# Patient Record
Sex: Female | Born: 1935
Health system: Southern US, Community
[De-identification: ages and names within clinical notes are randomized; demographics above are authoritative.]

## PROBLEM LIST (undated history)

## (undated) DIAGNOSIS — M1990 Unspecified osteoarthritis, unspecified site: Secondary | ICD-10-CM

## (undated) DIAGNOSIS — I471 Supraventricular tachycardia: Secondary | ICD-10-CM

## (undated) DIAGNOSIS — K219 Gastro-esophageal reflux disease without esophagitis: Secondary | ICD-10-CM

## (undated) DIAGNOSIS — E785 Hyperlipidemia, unspecified: Secondary | ICD-10-CM

## (undated) DIAGNOSIS — H3530 Unspecified macular degeneration: Secondary | ICD-10-CM

## (undated) DIAGNOSIS — N952 Postmenopausal atrophic vaginitis: Secondary | ICD-10-CM

## (undated) DIAGNOSIS — L309 Dermatitis, unspecified: Principal | ICD-10-CM

## (undated) DIAGNOSIS — S82899A Other fracture of unspecified lower leg, initial encounter for closed fracture: Secondary | ICD-10-CM

## (undated) DIAGNOSIS — R911 Solitary pulmonary nodule: Secondary | ICD-10-CM

## (undated) DIAGNOSIS — I4891 Unspecified atrial fibrillation: Secondary | ICD-10-CM

## (undated) DIAGNOSIS — F419 Anxiety disorder, unspecified: Secondary | ICD-10-CM

## (undated) HISTORY — DX: Hyperlipidemia, unspecified: E78.5

## (undated) HISTORY — DX: Other fracture of unspecified lower leg, initial encounter for closed fracture: S82.899A

## (undated) HISTORY — DX: Supraventricular tachycardia: I47.1

## (undated) HISTORY — DX: Dermatitis, unspecified: L30.9

## (undated) HISTORY — PX: ABDOMINAL HYSTERECTOMY: SHX81

## (undated) HISTORY — PX: HEMORRHOID SURGERY: SHX153

## (undated) HISTORY — PX: TRIGGER FINGER RELEASE: SHX641

## (undated) HISTORY — PX: CERVICAL DISC SURGERY: SHX588

## (undated) HISTORY — DX: Postmenopausal atrophic vaginitis: N95.2

## (undated) HISTORY — DX: Unspecified macular degeneration: H35.30

## (undated) HISTORY — PX: OTHER SURGICAL HISTORY: SHX169

## (undated) HISTORY — DX: Unspecified atrial fibrillation: I48.91

## (undated) HISTORY — DX: Gastro-esophageal reflux disease without esophagitis: K21.9

---

## 2001-01-23 ENCOUNTER — Ambulatory Visit (HOSPITAL_COMMUNITY): Admission: RE | Admit: 2001-01-23 | Discharge: 2001-01-23 | Payer: Self-pay | Admitting: Orthopaedic Surgery

## 2001-01-23 ENCOUNTER — Encounter: Payer: Self-pay | Admitting: Orthopaedic Surgery

## 2001-02-10 ENCOUNTER — Encounter: Payer: Self-pay | Admitting: Neurosurgery

## 2001-02-13 ENCOUNTER — Encounter: Payer: Self-pay | Admitting: Neurosurgery

## 2001-02-13 ENCOUNTER — Inpatient Hospital Stay (HOSPITAL_COMMUNITY): Admission: RE | Admit: 2001-02-13 | Discharge: 2001-02-14 | Payer: Self-pay | Admitting: Neurosurgery

## 2001-03-08 ENCOUNTER — Encounter: Admission: RE | Admit: 2001-03-08 | Discharge: 2001-03-08 | Payer: Self-pay | Admitting: Neurosurgery

## 2001-03-08 ENCOUNTER — Encounter: Payer: Self-pay | Admitting: Neurosurgery

## 2001-04-27 ENCOUNTER — Encounter: Payer: Self-pay | Admitting: Neurosurgery

## 2001-04-27 ENCOUNTER — Encounter: Admission: RE | Admit: 2001-04-27 | Discharge: 2001-04-27 | Payer: Self-pay | Admitting: Neurosurgery

## 2001-08-28 ENCOUNTER — Encounter: Payer: Self-pay | Admitting: Internal Medicine

## 2001-08-28 ENCOUNTER — Ambulatory Visit (HOSPITAL_COMMUNITY): Admission: RE | Admit: 2001-08-28 | Discharge: 2001-08-28 | Payer: Self-pay | Admitting: Internal Medicine

## 2001-09-22 ENCOUNTER — Ambulatory Visit (HOSPITAL_COMMUNITY): Admission: RE | Admit: 2001-09-22 | Discharge: 2001-09-22 | Payer: Self-pay | Admitting: Internal Medicine

## 2002-09-03 ENCOUNTER — Ambulatory Visit (HOSPITAL_COMMUNITY): Admission: RE | Admit: 2002-09-03 | Discharge: 2002-09-03 | Payer: Self-pay | Admitting: Internal Medicine

## 2002-09-03 ENCOUNTER — Encounter: Payer: Self-pay | Admitting: Internal Medicine

## 2003-03-21 ENCOUNTER — Encounter: Admission: RE | Admit: 2003-03-21 | Discharge: 2003-03-21 | Payer: Self-pay | Admitting: Orthopedic Surgery

## 2003-03-21 ENCOUNTER — Encounter: Payer: Self-pay | Admitting: Orthopedic Surgery

## 2003-03-22 ENCOUNTER — Ambulatory Visit (HOSPITAL_COMMUNITY): Admission: RE | Admit: 2003-03-22 | Discharge: 2003-03-22 | Payer: Self-pay | Admitting: Orthopedic Surgery

## 2003-03-22 ENCOUNTER — Ambulatory Visit (HOSPITAL_BASED_OUTPATIENT_CLINIC_OR_DEPARTMENT_OTHER): Admission: RE | Admit: 2003-03-22 | Discharge: 2003-03-22 | Payer: Self-pay | Admitting: Orthopedic Surgery

## 2003-09-09 ENCOUNTER — Ambulatory Visit (HOSPITAL_COMMUNITY): Admission: RE | Admit: 2003-09-09 | Discharge: 2003-09-09 | Payer: Self-pay | Admitting: Internal Medicine

## 2004-06-25 ENCOUNTER — Ambulatory Visit (HOSPITAL_COMMUNITY): Admission: RE | Admit: 2004-06-25 | Discharge: 2004-06-25 | Payer: Self-pay | Admitting: Internal Medicine

## 2004-11-12 ENCOUNTER — Ambulatory Visit: Payer: Self-pay | Admitting: Orthopedic Surgery

## 2005-02-01 ENCOUNTER — Ambulatory Visit (HOSPITAL_COMMUNITY): Admission: RE | Admit: 2005-02-01 | Discharge: 2005-02-01 | Payer: Self-pay | Admitting: Internal Medicine

## 2005-03-22 ENCOUNTER — Ambulatory Visit: Payer: Self-pay | Admitting: Orthopedic Surgery

## 2005-04-19 ENCOUNTER — Ambulatory Visit: Payer: Self-pay | Admitting: Orthopedic Surgery

## 2005-04-22 ENCOUNTER — Encounter: Payer: Self-pay | Admitting: Orthopedic Surgery

## 2005-04-22 ENCOUNTER — Ambulatory Visit (HOSPITAL_COMMUNITY): Admission: RE | Admit: 2005-04-22 | Discharge: 2005-04-22 | Payer: Self-pay | Admitting: Orthopedic Surgery

## 2006-03-04 ENCOUNTER — Ambulatory Visit (HOSPITAL_COMMUNITY): Admission: RE | Admit: 2006-03-04 | Discharge: 2006-03-04 | Payer: Self-pay | Admitting: Internal Medicine

## 2006-09-01 ENCOUNTER — Ambulatory Visit (HOSPITAL_COMMUNITY): Admission: RE | Admit: 2006-09-01 | Discharge: 2006-09-01 | Payer: Self-pay | Admitting: Internal Medicine

## 2007-08-29 ENCOUNTER — Ambulatory Visit (HOSPITAL_COMMUNITY): Admission: RE | Admit: 2007-08-29 | Discharge: 2007-08-29 | Payer: Self-pay | Admitting: Internal Medicine

## 2007-09-14 ENCOUNTER — Ambulatory Visit: Payer: Self-pay | Admitting: Orthopedic Surgery

## 2007-09-14 DIAGNOSIS — M25519 Pain in unspecified shoulder: Secondary | ICD-10-CM

## 2007-09-14 DIAGNOSIS — M7580 Other shoulder lesions, unspecified shoulder: Secondary | ICD-10-CM

## 2007-10-23 ENCOUNTER — Encounter: Payer: Self-pay | Admitting: Orthopedic Surgery

## 2007-10-23 ENCOUNTER — Telehealth: Payer: Self-pay | Admitting: Orthopedic Surgery

## 2007-10-23 ENCOUNTER — Ambulatory Visit (HOSPITAL_COMMUNITY): Admission: RE | Admit: 2007-10-23 | Discharge: 2007-10-23 | Payer: Self-pay | Admitting: Internal Medicine

## 2007-10-30 ENCOUNTER — Ambulatory Visit: Payer: Self-pay | Admitting: Orthopedic Surgery

## 2007-10-30 DIAGNOSIS — M5380 Other specified dorsopathies, site unspecified: Secondary | ICD-10-CM | POA: Insufficient documentation

## 2007-10-30 DIAGNOSIS — M545 Low back pain: Secondary | ICD-10-CM

## 2008-06-21 HISTORY — PX: KNEE ARTHROSCOPY: SUR90

## 2008-07-30 ENCOUNTER — Ambulatory Visit (HOSPITAL_COMMUNITY): Admission: RE | Admit: 2008-07-30 | Discharge: 2008-07-30 | Payer: Self-pay | Admitting: Internal Medicine

## 2008-10-22 ENCOUNTER — Encounter: Payer: Self-pay | Admitting: Orthopedic Surgery

## 2008-11-20 ENCOUNTER — Ambulatory Visit: Payer: Self-pay | Admitting: Orthopedic Surgery

## 2008-11-20 DIAGNOSIS — M1710 Unilateral primary osteoarthritis, unspecified knee: Secondary | ICD-10-CM | POA: Insufficient documentation

## 2008-11-20 DIAGNOSIS — M25569 Pain in unspecified knee: Secondary | ICD-10-CM | POA: Insufficient documentation

## 2009-04-16 ENCOUNTER — Ambulatory Visit: Payer: Self-pay | Admitting: Orthopedic Surgery

## 2009-04-16 DIAGNOSIS — M23302 Other meniscus derangements, unspecified lateral meniscus, unspecified knee: Secondary | ICD-10-CM

## 2009-04-29 ENCOUNTER — Telehealth: Payer: Self-pay | Admitting: Orthopedic Surgery

## 2009-05-02 ENCOUNTER — Ambulatory Visit: Payer: Self-pay | Admitting: Orthopedic Surgery

## 2009-05-02 ENCOUNTER — Ambulatory Visit (HOSPITAL_COMMUNITY): Admission: RE | Admit: 2009-05-02 | Discharge: 2009-05-02 | Payer: Self-pay | Admitting: Orthopedic Surgery

## 2009-05-06 ENCOUNTER — Ambulatory Visit: Payer: Self-pay | Admitting: Orthopedic Surgery

## 2009-05-19 ENCOUNTER — Ambulatory Visit: Payer: Self-pay | Admitting: Orthopedic Surgery

## 2010-01-12 ENCOUNTER — Ambulatory Visit (HOSPITAL_COMMUNITY): Admission: RE | Admit: 2010-01-12 | Discharge: 2010-01-12 | Payer: Self-pay | Admitting: Internal Medicine

## 2010-07-23 NOTE — Letter (Signed)
Summary: surgery order LT knee sched 05/02/09  surgery order LT knee sched 05/02/09   Imported By: Cammie Sickle 08/05/2009 16:16:17  _____________________________________________________________________  External Attachment:    Type:   Image     Comment:   External Document

## 2010-09-23 LAB — DIFFERENTIAL
Basophils Relative: 0 % (ref 0–1)
Lymphs Abs: 1.5 10*3/uL (ref 0.7–4.0)
Monocytes Relative: 10 % (ref 3–12)
Neutro Abs: 3.3 10*3/uL (ref 1.7–7.7)
Neutrophils Relative %: 61 % (ref 43–77)

## 2010-09-23 LAB — BASIC METABOLIC PANEL
BUN: 11 mg/dL (ref 6–23)
Calcium: 10 mg/dL (ref 8.4–10.5)
Creatinine, Ser: 0.72 mg/dL (ref 0.4–1.2)
GFR calc Af Amer: >60 mL/min (ref >60)

## 2010-09-23 LAB — CBC
MCHC: 34.3 g/dL (ref 30.0–36.0)
Platelets: 221 10*3/uL (ref 150–400)
RBC: 4.24 MIL/uL (ref 3.87–5.11)
WBC: 5.3 10*3/uL (ref 4.0–10.5)

## 2010-11-06 NOTE — Op Note (Signed)
[place]  Patient:    Marcia Woods, Marcia Woods Visit Number: [id number] MRN: [id number]          Service Type: SUR Location: 5700 5729 02 Attending Physician:  Coletta Memos Proc. Date: 02/13/01 Adm. Date:  02/13/2001 Disc. Date: 02/14/2001                             Operative Report  PREOPERATIVE DIAGNOSIS: 1. Displaced disk, right C6-7. 2. Right C7 radiculopathy. 3. Spondylosis.  POSTOPERATIVE DIAGNOSIS: 1. Displaced disk, right C6-7. 2. Right C7 radiculopathy. 3. Spondylosis.  OPERATION PERFORMED:  Anterior cervical diskectomy C6-7, arthrodesis C6-7 with 7 mm allograft and anterior plating using 16 mm Synthes plate.  COMPLICATIONS:  None.  SURGEON:  Kyle L. Franky Macho, M.D.  ASSISTANT:  Stefani Dama, M.D.  ANESTHESIA:  General endotracheal.  INDICATIONS FOR PROCEDURE:  The patient is a 77 year old with a two-month history of severe pain in her right upper extremity along with numbness.  She has had medication which was not helpful.  She did not want any further conservative treatment.  DESCRIPTION OF PROCEDURE:  The patient was brought to the operating room, intubated and placed under general anesthesia without difficulty.  Her neck was placed in slight extension with five pounds of cervical traction.  She was prepped and draped in sterile fashion.  I infiltrated 2 cc of 0.5% lidocaine 1:200,000 strength epinephrine in the most caudal skin creases she had.  I made the incision starting in the midline, taken to the border of the medial border of the left sternocleidomastoid muscle.  Using Metzenbaum scissors I was then able to divide the platysma horizontally.  I also used the Metzenbaum scissors to create a plane between the sternocleidomastoid and strap muscles to the prevertebral space.  I used a hand held retractor and removed prevertebral tissue.  I placed the needle and this was at C6-7.  I then reflect the longus colli  muscles laterally and replaced self-retaining Caspar retractors.  I opened the disk space with a #15 blade and used curet and pituitary rongeurs along with Kerrison punches to perform the diskectomy. When that was done, I placed an intervertebral spreader into the disk space and then used a high speed drill to remove osteophytes and some soft tissue. I then used a Kerrison punch with thin foot plate to get underneath the ligament and open that.  There was significant bleeding encountered in the posterior longitudinal ligament. Dr. Danielle Dess assisted during this portion of the case.  We were able to decompress both C7 nerve roots quite well.  I inspected the nerve roots with a micronerve hook and felt that there was no compression and also felt no residual disk material.  I was satisfied with the decompression.  I then irrigated the wound and placed in Gelfoam.  I then placed a 7 mm allograft after using a QD11 bur to shape the end plates.  After removing the weight, I then placed a 16 mm plate, two screws in C6, two screws in C7 with locking screws.  X-ray showed the plate and the plug to be in good position.  I irrigated the wound and then closed the wound in layered fashion using Vicryl sutures reapproximating the platysma and the subcutaneous tissues.  Dermabond used for sterile dressing. Attending Physician:  Coletta Memos DD:  02/13/01 TD:  02/14/01 Job: [id number]

## 2010-11-06 NOTE — H&P (Signed)
[place]  Patient:    Marcia, Woods Visit Number: [id number] MRN: [id number]          Service Type: SUR Location: 5700 5729 02 Attending Physician:  Coletta Memos Dictated by:   Mena Goes. Franky Macho, M.D. Adm. Date:  02/13/2001                           History and Physical  ADMITTING DIAGNOSIS: Displaced disk, C6-7, right, with spondylosis and right C7 radiculopathy.  HISTORY OF PRESENT ILLNESS: Marcia Woods is a 77 year old woman, who presented February 08, 2001 with pain and tenderness in the right upper extremity.  She complained of the arm going numb and it has not improved.  She has been hurting for the last two months.  She has tried both Bextra and Celebrex without relief.  She has no temporally related traumatic events.  The pain came on rather suddenly and has gradually worsened since its onset.  She has not had pain like this in the past.  She has noticed that she is not able to hold objects in her right hand as well as she did in the past.  She has had no bowel or bladder dysfunction.  She complains of pain in the right arm.  She also has some numbness in the right lower extremity, but none in the right torso.  PAST MEDICAL HISTORY: Excellent.  PAST SURGICAL HISTORY:  1. Partial hysterectomy.  2. Neurectomies in the feet.  3. Hemorrhoid surgery.  4. Laser surgery for macular degeneration, bilateral.  ALLERGIES: No known drug allergies.  MEDICATIONS:  1. Actonel 30 mg once a week.  2. Ocuvite b.i.d. for vision.  FAMILY HISTORY: Mother and father both deceased.  SOCIAL HISTORY: She does not smoke, drink, nor does she use illicit drugs.  REVIEW OF SYSTEMS: Positive for irregular pulse, leg pain with walking, leg weakness, arm pain, and leg pain.  She denies constitutional, eye, ear, nose, throat, mouth, respiratory, gastrointestinal, genitourinary, skin, neurologic, psychiatric, endocrine, hematologic, and allergic  problems.  PHYSICAL EXAMINATION:  VITAL SIGNS: Height 5 feet 4-1/2 inches.  Weight 130 pounds.  GENERAL: Marcia Woods is alert and oriented x 4, and answers all questions appropriately.  Memory, language, attention span, and fund of knowledge are normal.  She is well-kempt and in no distress.  NEUROLOGIC: PERRL.  EOMI.  Funduscopic examination normal.  Normal visual fields.  No papilledema.  Strength 5/5 in left upper and both lower extremities.  Mild weakness, right triceps.  Intact proprioception and pinprick in the upper and lower extremities.  Normal muscle tone, bulk, and coordination.  Romberg test negative.  Gait normal.  Reflexes 2+ at the biceps, triceps, brachial radialis, knee, and ankles.  No clonus appreciated. Toes downgoing to plantar stimulation.  NECK: No cervical masses or bruits.  CHEST: Lung fields are clear.  HEART: Regular rhythm and rate on examination today.  Pulse 88.  No murmurs or rubs appreciated.  EXTREMITIES: Pulses good at the wrists and feet bilaterally.  LABORATORY DATA: MRI shows an exaggerated lordosis and foraminal narrowing and stenosis at C6-7 on the right side, normal cord signal throughout.  IMPRESSION: Marcia Woods feels she is in a significant amount of pain.  She did not want to try further conservative treatment, which would have included therapy and traction.  For that reason she is scheduled today to undergo anterior cervical diskectomy and arthrodesis.  Risks of the procedure including bleeding,  infection, no pain relief, bowel or bladder dysfunction and need for further surgery, damage to the recurrent laryngeal nerve were explained.  She understands and wishes to proceed. Dictated by:   Mena Goes. Franky Macho, M.D. Attending Physician:  Coletta Memos DD:  02/13/01 TD:  02/14/01 Job: [id number]

## 2010-11-06 NOTE — Op Note (Signed)
[name]  Patient:    Marcia Woods, Marcia Woods Visit Number: [id number] MRN: [id number]          Service Type: END Location: DAY Attending Physician:  Malissa Hippo Dictated by:   Lionel December, M.D. Proc. Date: 09/22/01 Admit Date:  09/22/2001                             Operative Report  PROCEDURE:  Total colonoscopy.  ENDOSCOPIST:  Lionel December, M.D.  INDICATION:  Ms. Gadea is a 77 year old Caucasian female who is here for screening colonoscopy.  Family history is negative for colorectal carcinoma and she is therefore felt to be of average risk.  Procedure and risks were reviewed with the patient and informed consent was obtained.  PREOPERATIVE MEDICATIONS:  Demerol 50 mg IV, Versed 3 mg IV in divided dose.  INSTRUMENT:  Olympus video system.  FINDINGS:  Procedure was performed in endoscopy suite.  Patients vital signs and O2 saturations were monitored during the procedure and remained stable. Patient was placed in the left lateral decubitus position and rectal examination performed.  She had soft sentinel skin tags.  Digital exam was normal.  Scope was placed in the rectum and placed under direct vision to the sigmoid colon and beyond.  Preparation was satisfactory.  Scope was passed to the cecum, which was identified to the ileocecal valve and appendiceal orifice.  Pictures could not be taken because of malfunction of system.  As the scope was withdrawn, mucosa was very carefully examined and there were no polyps, tumor masses or diverticula.  Rectal mucosa was normal.  Scope was retroflexed to examine anorectal junction and hemorrhoids were noted below the dentate line.  Endoscope was straightened and withdrawn.  Patient tolerated the procedure well.  FINAL DIAGNOSIS:  Small external hemorrhoids, otherwise, normal total colonoscopy.  RECOMMENDATIONS:  She can resume her ASA.  She should continue yearly Hemoccults.  Dictated by:   Lionel December, M.D. Attending Physician:  Malissa Hippo DD:  09/22/01 TD:  09/23/01 Job: [id number]

## 2010-11-06 NOTE — Op Note (Signed)
[name], [name]                         ACCOUNT NO.:  [id number]   MEDICAL RECORD NO.:  [id number]                   PATIENT TYPE:  OUT   LOCATION:  [facility]                                 FACILITY:  [facility]   PHYSICIAN:  Harvie Junior, M.D.                DATE OF BIRTH:  23-Jan-1926   DATE OF PROCEDURE:  03/22/2003  DATE OF DISCHARGE:  03/22/2003                                 OPERATIVE REPORT   PREOPERATIVE DIAGNOSIS:  Trigger thumb right.   POSTOPERATIVE DIAGNOSIS:  Trigger thumb right.   PROCEDURE:  Release right trigger  thumb.   SURGEON:  Harvie Junior, M.D.   ASSISTANT:  Marshia Ly, P.A.   ANESTHESIA:  Forearm based IV regional.   INDICATIONS FOR PROCEDURE:  The patient is a 77 year old female with a long  history of having locking in the right thumb. This ultimately had to be  pulled out with the other  hand when it was locked. Because of continued  complaints of pain and failure of conservative care, the patient was taken  to the operating room for right trigger thumb release.   DESCRIPTION OF PROCEDURE:  After adequate anesthesia was obtained with a  forearm based IV regional, the patient was placed on the operating table.  The right arm was prepped and draped in the usual sterile fashion.   Following this a linear incision was made in the thumb crease. The  subcutaneous tissue was taken down to the level of the A1 pulley. It was  clearly identified. The digital nerves were retracted and the A1 pulley was  released. The flexor tendon to the thumb could then be pulled out of the  wound easily. There was no tendency towards triggering or locking at this  point.   The wound was copiously irrigated and suctioned dry. It was closed with 2  interrupted horizontal mattress stitches. A sterile compressive dressing was  applied. The patient was taken to the recovery room in satisfactory  condition. Estimated blood loss for the procedure was none.                                  Harvie Junior, M.D.    Ranae Plumber  D:  06/14/2003  T:  06/16/2003  Job:  [id number]

## 2011-01-25 ENCOUNTER — Other Ambulatory Visit (HOSPITAL_COMMUNITY): Payer: Self-pay | Admitting: Internal Medicine

## 2011-01-25 DIAGNOSIS — Z139 Encounter for screening, unspecified: Secondary | ICD-10-CM

## 2011-02-02 ENCOUNTER — Ambulatory Visit (HOSPITAL_COMMUNITY)
Admission: RE | Admit: 2011-02-02 | Discharge: 2011-02-02 | Disposition: A | Discharge status: Home / Self Care | Payer: Medicare Other | Source: Ambulatory Visit | Attending: Internal Medicine | Admitting: Internal Medicine

## 2011-02-02 DIAGNOSIS — Z139 Encounter for screening, unspecified: Secondary | ICD-10-CM

## 2011-02-02 DIAGNOSIS — Z1231 Encounter for screening mammogram for malignant neoplasm of breast: Secondary | ICD-10-CM | POA: Insufficient documentation

## 2011-03-19 ENCOUNTER — Encounter (INDEPENDENT_AMBULATORY_CARE_PROVIDER_SITE_OTHER): Payer: Self-pay | Admitting: *Deleted

## 2011-03-25 ENCOUNTER — Ambulatory Visit (INDEPENDENT_AMBULATORY_CARE_PROVIDER_SITE_OTHER): Payer: Medicare Other | Admitting: Orthopedic Surgery

## 2011-03-25 ENCOUNTER — Encounter: Payer: Self-pay | Admitting: Orthopedic Surgery

## 2011-03-25 VITALS — BP 150/80 | Ht 64.5 in | Wt 135.6 lb

## 2011-03-25 DIAGNOSIS — M1710 Unilateral primary osteoarthritis, unspecified knee: Secondary | ICD-10-CM

## 2011-03-25 MED ORDER — DICLOFENAC POTASSIUM 50 MG PO TABS: 50.0000 mg | ORAL_TABLET | Freq: Two times a day (BID) | ORAL | Status: DC

## 2011-03-25 MED ORDER — METHYLPREDNISOLONE ACETATE 40 MG/ML IJ SUSP: 40.0000 mg | Freq: Once | INTRAMUSCULAR | Status: DC

## 2011-03-25 NOTE — Progress Notes (Signed)
Chief complaint RIGHT knee pain  I have 77 year old female stepped up on a curb or off a curb and twisted her RIGHT knee complains of anterior knee pain she was due for followup visit anyway.  Her pain is 3/10 it is aching its deep in the RIGHT knee joint.  No catching or locking no swelling.  She has some pain behind her knee.  Her exam is significant for the following she has anterior crepitance and knee pain with patella compression joint lines are nontender meniscal signs are negative strength is normal knee is stable skin is intact pulses good sensation is normal  X-rays show patellofemoral arthritis  Recommend cortisone injection discussed possible knee replacement patient declined  She will followup either for another injection in 3-4 months or for knee surgery.  Separate x-ray report 3 views RIGHT knee  X-rays taken to cause of injury and knee pain  Patellofemoral arthritis is noted with complete joint space loss on the medial side.  She has a fairly good joint space medial and lateral tibiofemoral joint  Impression patellofemoral arthritis severe, mild tibiofemoral arthritis  Inject RIGHT knee  Knee  Injection Procedure Note  Pre-operative Diagnosis: right knee oa  Post-operative Diagnosis: same  Indications: pain  Anesthesia: ethyl chloride   Procedure Details   Verbal consent was obtained for the procedure. Time out was completed.The joint was prepped with alcohol, followed by  Ethyl chloride spray and A 20 gauge needle was inserted into the knee via lateral approach; 4ml 1% lidocaine and 1 ml of depomedrol  was then injected into the joint . The needle was removed and the area cleansed and dressed.  Complications:  None; patient tolerated the procedure well.

## 2011-03-25 NOTE — Patient Instructions (Signed)
You have received a steroid shot. 15% of patients experience increased pain at the injection site with in the next 24 hours. This is best treated with ice and tylenol extra strength 2 tabs every 8 hours. If you are still having pain please call the office.    

## 2011-04-07 ENCOUNTER — Encounter (INDEPENDENT_AMBULATORY_CARE_PROVIDER_SITE_OTHER): Payer: Self-pay | Admitting: Internal Medicine

## 2011-04-07 ENCOUNTER — Other Ambulatory Visit (INDEPENDENT_AMBULATORY_CARE_PROVIDER_SITE_OTHER): Payer: Self-pay | Admitting: *Deleted

## 2011-04-07 ENCOUNTER — Ambulatory Visit (INDEPENDENT_AMBULATORY_CARE_PROVIDER_SITE_OTHER): Payer: Medicare Other | Admitting: Internal Medicine

## 2011-04-07 ENCOUNTER — Telehealth (INDEPENDENT_AMBULATORY_CARE_PROVIDER_SITE_OTHER): Payer: Self-pay | Admitting: *Deleted

## 2011-04-07 ENCOUNTER — Encounter (INDEPENDENT_AMBULATORY_CARE_PROVIDER_SITE_OTHER): Payer: Self-pay | Admitting: *Deleted

## 2011-04-07 DIAGNOSIS — K5900 Constipation, unspecified: Secondary | ICD-10-CM

## 2011-04-07 DIAGNOSIS — Z1211 Encounter for screening for malignant neoplasm of colon: Secondary | ICD-10-CM

## 2011-04-07 MED ORDER — PEG-KCL-NACL-NASULF-NA ASC-C 100 G PO SOLR: 1.0000 | Freq: Once | ORAL | Status: DC

## 2011-04-07 NOTE — Progress Notes (Signed)
Subjective:     Patient ID: Marcia Woods, female   DOB: [birth year], 77 y.o.   MRN: [id number]  HPI Referred by Roseanne Reno NP-C for bloating sensation.  She says she passes gas frequently.  She feels heavy in her abdomen.   She says she feels a chill across abdomen.  She usually has a BM every day or ever other day. Stools are small, which has been going for about 3 months. She says she has been constipated.  She denies melena or bright red rectal bleeding. She also has frequent acid reflux. Her last colonoscopy was 10-15  Yrs ago by Dr. Karilyn Cota.  She was recently seen at [facility] and was guaiac negative. Appetite is good. No weight loss. Daughter in law is present in room.  Review of Systems see hpi     Current Outpatient Prescriptions  Medication Sig Dispense Refill  . ALPRAZolam (XANAX) 1 MG tablet One half tab at bedtime       . Biotin 1000 MCG tablet Take 1,000 mcg by mouth 3 (three) times daily.        . Multiple Vitamins-Minerals (PRESERVISION AREDS 2 PO) Take by mouth.        . multivitamin-iron-minerals-folic acid (CENTRUM) chewable tablet Chew 1 tablet by mouth daily.        . pantoprazole (PROTONIX) 40 MG tablet Take 40 mg by mouth daily.        . pravastatin (PRAVACHOL) 40 MG tablet Take 40 mg by mouth daily.         Current Facility-Administered Medications  Medication Dose Route Frequency Provider Last Rate Last Dose  . methylPREDNISolone acetate (DEPO-MEDROL) injection 40 mg  40 mg Intra-articular Once Fuller Canada, MD       Past Medical History  Diagnosis Date  . Hyperlipemia   . Acid reflux   . Macular degeneration    History   Social History Narrative  . No narrative on file   History   Social History  . Marital Status: Widowed    Spouse Name: N/A    Number of Children: N/A  . Years of Education: N/A   Occupational History  . Not on file.   Social History Main Topics  . Smoking status: Never Smoker   . Smokeless tobacco: Not on file  . Alcohol  Use: No  . Drug Use: No  . Sexually Active: Not on file   Other Topics Concern  . Not on file   Social History Narrative  . No narrative on file   History reviewed. No pertinent family history. Family Status  Relation Status Death Age  . Mother Deceased     MI  . Father Deceased      MI  . Sister Alive     one deceased from hepatitis C. Other in good health  . Brother Alive     good health  . Child Alive     One in good health. One in good health   No Known Allergies  Objective:   Physical Exam Filed Vitals:   04/07/11 0948  BP: 170/60  Pulse: 66  Temp: 97.9 F (36.6 C)  Height: 5\' 4"  (1.626 m)  Weight: 138 lb 12.8 oz (62.959 kg)    Alert and oriented. Skin warm and dry. Oral mucosa is moist. Dentures in place. Sclera anicteric, conjunctivae is pink. Thyroid not enlarged. No cervical lymphadenopathy. Lungs clear. Heart regular rate and rhythm.  Abdomen is soft. Bowel sounds are positive. No hepatomegaly.  No abdominal masses felt. No tenderness.  No edema to lower extremities. Patient is alert and oriented.      Assessment:    Bloating. In need of screening colonoscopy. Colonic neoplasm needs to be ruled out    Plan:   Will schedule a colonoscopy in the near future.     The risks and benefits such as perforation, bleeding, and infection were reviewed with the patient and is agreeable.

## 2011-04-07 NOTE — Telephone Encounter (Signed)
Patient needs movi prep 

## 2011-04-07 NOTE — Patient Instructions (Signed)
Conitinue present medications. See Dr. Ouida Sills concerning your blood pressure.

## 2011-04-26 ENCOUNTER — Encounter (HOSPITAL_COMMUNITY): Payer: Self-pay | Admitting: Pharmacy Technician

## 2011-04-29 MED ORDER — SODIUM CHLORIDE 0.45 % IV SOLN
Freq: Once | INTRAVENOUS | Status: AC
  Administered 2011-04-30: 08:00:00 via INTRAVENOUS

## 2011-04-30 ENCOUNTER — Encounter (HOSPITAL_COMMUNITY)
Admission: RE | Disposition: A | Discharge status: Home / Self Care | Payer: Self-pay | Source: Ambulatory Visit | Attending: Internal Medicine

## 2011-04-30 ENCOUNTER — Ambulatory Visit (HOSPITAL_COMMUNITY)
Admission: RE | Admit: 2011-04-30 | Discharge: 2011-04-30 | Disposition: A | Discharge status: Home / Self Care | Payer: Medicare Other | Source: Ambulatory Visit | Attending: Internal Medicine | Admitting: Internal Medicine

## 2011-04-30 ENCOUNTER — Encounter (HOSPITAL_COMMUNITY): Payer: Self-pay | Admitting: *Deleted

## 2011-04-30 DIAGNOSIS — R142 Eructation: Secondary | ICD-10-CM

## 2011-04-30 DIAGNOSIS — R141 Gas pain: Secondary | ICD-10-CM

## 2011-04-30 DIAGNOSIS — R143 Flatulence: Secondary | ICD-10-CM

## 2011-04-30 DIAGNOSIS — E785 Hyperlipidemia, unspecified: Secondary | ICD-10-CM | POA: Insufficient documentation

## 2011-04-30 DIAGNOSIS — R198 Other specified symptoms and signs involving the digestive system and abdomen: Secondary | ICD-10-CM | POA: Insufficient documentation

## 2011-04-30 DIAGNOSIS — Z1211 Encounter for screening for malignant neoplasm of colon: Secondary | ICD-10-CM

## 2011-04-30 DIAGNOSIS — K5900 Constipation, unspecified: Secondary | ICD-10-CM

## 2011-04-30 HISTORY — PX: COLONOSCOPY: SHX5424

## 2011-04-30 SURGERY — COLONOSCOPY
Anesthesia: Moderate Sedation

## 2011-04-30 MED ORDER — STERILE WATER FOR IRRIGATION IR SOLN
Status: DC | PRN
  Administered 2011-04-30: 09:00:00

## 2011-04-30 MED ORDER — MIDAZOLAM HCL 5 MG/5ML IJ SOLN
INTRAMUSCULAR | Status: DC | PRN
  Administered 2011-04-30: 2 mg via INTRAVENOUS
  Administered 2011-04-30 (×3): 1 mg via INTRAVENOUS

## 2011-04-30 MED ORDER — MEPERIDINE HCL 50 MG/ML IJ SOLN
INTRAMUSCULAR | Status: DC | PRN
  Administered 2011-04-30: 25 mg via INTRAVENOUS

## 2011-04-30 MED ORDER — MIDAZOLAM HCL 5 MG/5ML IJ SOLN
INTRAMUSCULAR | Status: AC
  Filled 2011-04-30: qty 10

## 2011-04-30 MED ORDER — MEPERIDINE HCL 50 MG/ML IJ SOLN
INTRAMUSCULAR | Status: AC
  Filled 2011-04-30: qty 1

## 2011-04-30 NOTE — H&P (Signed)
This is an update to history and physical from 04/07/2011. Patient's symptoms i.e. bloating and constipation have not changed. Symptoms started about a year ago. She denies abdominal pain or rectal bleeding. Patient's loss colonoscopy was 15 years ago. She is undergoing diagnostic colonoscopy today.

## 2011-04-30 NOTE — Op Note (Signed)
COLONOSCOPY PROCEDURE REPORT  PATIENT:  Marcia Woods  MR#:  [id number] Birthdate:  10-10-1925, 77 y.o., female Endoscopist:  Dr. Malissa Hippo, MD Referred By:  Dr. Carylon Perches, MD. Procedure Date: 04/30/2011  Procedure:   Colonoscopy  Indications:  Patient is 77 year old Caucasian female who was noted change in her bowel habits with constipation and bloating. She is undergoing diagnostic examination. Patient's last colonoscopy was over 15 years ago.  Informed Consent:  Procedure and risks were reviewed with the patient and informed consent was obtained. Medications:  Demerol 25 mg IV Versed 5 mg IV  Description of procedure:  After a digital rectal exam was performed, that colonoscope was advanced from the anus through the rectum and colon to the area of the cecum, ileocecal valve and appendiceal orifice. The cecum was deeply intubated. These structures were well-seen and photographed for the record. From the level of the cecum and ileocecal valve, the scope was slowly and cautiously withdrawn. The mucosal surfaces were carefully surveyed utilizing scope tip to flexion to facilitate fold flattening as needed. The scope was pulled down into the rectum where a thorough exam including retroflexion was performed.  Findings:   Prep excellent. Normal mucosa of colon and rectum. Normal anorectal junction.  Therapeutic/Diagnostic Maneuvers Performed:  None  Complications:  None  Cecal Withdrawal Time:  8 minutes  Impression:  Normal colonoscopy.  Recommendations:  Standard instructions given. Continue high-fiber diet and fiber supplement 3-4 g daily. colace 2 tablets by mouth daily at bedtime. If above measures fail will switch her to MiraLAX.  Murray Durrell U  04/30/2011 9:14 AM  CC: Dr. Carylon Perches, MD & Dr. Bonnetta Barry ref. provider found

## 2011-05-07 ENCOUNTER — Encounter (HOSPITAL_COMMUNITY): Payer: Self-pay | Admitting: Internal Medicine

## 2011-05-19 ENCOUNTER — Emergency Department (HOSPITAL_COMMUNITY)
Admission: EM | Admit: 2011-05-19 | Discharge: 2011-05-19 | Disposition: A | Discharge status: Home / Self Care | Payer: Medicare Other | Attending: Emergency Medicine | Admitting: Emergency Medicine

## 2011-05-19 ENCOUNTER — Emergency Department (HOSPITAL_COMMUNITY): Payer: Medicare Other

## 2011-05-19 ENCOUNTER — Encounter (HOSPITAL_COMMUNITY): Payer: Self-pay | Admitting: *Deleted

## 2011-05-19 DIAGNOSIS — R10816 Epigastric abdominal tenderness: Secondary | ICD-10-CM | POA: Insufficient documentation

## 2011-05-19 DIAGNOSIS — R110 Nausea: Secondary | ICD-10-CM | POA: Insufficient documentation

## 2011-05-19 DIAGNOSIS — H3530 Unspecified macular degeneration: Secondary | ICD-10-CM | POA: Insufficient documentation

## 2011-05-19 DIAGNOSIS — K3189 Other diseases of stomach and duodenum: Secondary | ICD-10-CM

## 2011-05-19 DIAGNOSIS — Z9079 Acquired absence of other genital organ(s): Secondary | ICD-10-CM | POA: Insufficient documentation

## 2011-05-19 DIAGNOSIS — R109 Unspecified abdominal pain: Secondary | ICD-10-CM | POA: Insufficient documentation

## 2011-05-19 DIAGNOSIS — K219 Gastro-esophageal reflux disease without esophagitis: Secondary | ICD-10-CM | POA: Insufficient documentation

## 2011-05-19 DIAGNOSIS — E785 Hyperlipidemia, unspecified: Secondary | ICD-10-CM | POA: Insufficient documentation

## 2011-05-19 DIAGNOSIS — R1900 Intra-abdominal and pelvic swelling, mass and lump, unspecified site: Secondary | ICD-10-CM | POA: Insufficient documentation

## 2011-05-19 DIAGNOSIS — Z800 Family history of malignant neoplasm of digestive organs: Secondary | ICD-10-CM | POA: Insufficient documentation

## 2011-05-19 LAB — DIFFERENTIAL
Basophils Absolute: 0 10*3/uL (ref 0.0–0.1)
Basophils Relative: 1 % (ref 0–1)
Eosinophils Relative: 0 % (ref 0–5)
Monocytes Absolute: 0.5 10*3/uL (ref 0.1–1.0)
Neutro Abs: 4.4 10*3/uL (ref 1.7–7.7)

## 2011-05-19 LAB — COMPREHENSIVE METABOLIC PANEL
AST: 42 U/L — ABNORMAL HIGH (ref 0–37)
Albumin: 3.7 g/dL (ref 3.5–5.2)
Calcium: 9.8 mg/dL (ref 8.4–10.5)
Chloride: 97 mEq/L (ref 96–112)
Creatinine, Ser: 0.68 mg/dL (ref 0.50–1.10)

## 2011-05-19 LAB — CBC
HCT: 40.7 % (ref 36.0–46.0)
MCHC: 32.9 g/dL (ref 30.0–36.0)
Platelets: 210 10*3/uL (ref 150–400)
RDW: 13.5 % (ref 11.5–15.5)

## 2011-05-19 LAB — TROPONIN I: Troponin I: <0.3 ng/mL (ref <0.30)

## 2011-05-19 MED ORDER — SODIUM CHLORIDE 0.9 % IV BOLUS (SEPSIS)
500.0000 mL | Freq: Once | INTRAVENOUS | Status: AC
  Administered 2011-05-19: 500 mL via INTRAVENOUS

## 2011-05-19 MED ORDER — IOHEXOL 300 MG/ML  SOLN
100.0000 mL | Freq: Once | INTRAMUSCULAR | Status: AC | PRN
  Administered 2011-05-19: 100 mL via INTRAVENOUS

## 2011-05-19 MED ORDER — ONDANSETRON 8 MG PO TBDP: 8.0000 mg | ORAL_TABLET | Freq: Three times a day (TID) | ORAL | Status: AC | PRN

## 2011-05-19 MED ORDER — ONDANSETRON HCL 4 MG/2ML IJ SOLN
4.0000 mg | Freq: Once | INTRAMUSCULAR | Status: AC
  Administered 2011-05-19: 4 mg via INTRAVENOUS
  Filled 2011-05-19: qty 2

## 2011-05-19 NOTE — ED Notes (Signed)
MD at bedside. 

## 2011-05-19 NOTE — ED Notes (Signed)
Pt out to ct

## 2011-05-19 NOTE — ED Notes (Signed)
Pt c/o nausea and discomfort in epigastric area. Pt states she has been nauseated for several weeks.

## 2011-05-19 NOTE — ED Notes (Signed)
Discharge instructions reviewed with pt, questions answered. Pt verbalized understanding.  

## 2011-05-19 NOTE — ED Provider Notes (Signed)
History     CSN: [id number] Arrival date & time: 05/19/2011  5:33 PM   First MD Initiated Contact with Patient 05/19/11 1736      Chief Complaint  Patient presents with  . Abdominal Pain  . Nausea    (Consider location/radiation/quality/duration/timing/severity/associated sxs/prior treatment) Patient is a 77 y.o. female presenting with abdominal pain. The history is provided by the patient.  Abdominal Pain The primary symptoms of the illness include abdominal pain and nausea. The primary symptoms of the illness do not include shortness of breath, vomiting or diarrhea. The onset of the illness was gradual.  Symptoms associated with the illness do not include constipation or back pain.   patient has had epigastric abdominal pain along with nauseous this for several weeks. She's been seen by GI and recently had a negative colonoscopy. She states the pain is constant. She says it does not change with eating. It does not change with exertion. She states she has trouble eating because of nausea since. She states she gets so nauseous yesterday she felt like she was on a pass out. She states the pain does go up toward chest sometimes but she states this is reflux. She did have constipation, but that has resolved. No fevers. No weight loss. He states the pain has not really changed over these last few months.  Past Medical History  Diagnosis Date  . Hyperlipemia   . Acid reflux   . Macular degeneration   . Constipation     Past Surgical History  Procedure Date  . Knee arthroscopy   . Trigger finger release   . Hemorrhoid surgery   . Cataract surgery     bilaterally  . Abdominal hysterectomy   . Colonoscopy 04/30/2011    Procedure: COLONOSCOPY;  Surgeon: Malissa Hippo, MD;  Location: [facility];  Service: Endoscopy;  Laterality: N/A;  8:30 am    Family History  Problem Relation Age of Onset  . Colon cancer Neg Hx     History  Substance Use Topics  . Smoking status: Never  Smoker   . Smokeless tobacco: Not on file  . Alcohol Use: No    OB History    Grav Para Term Preterm Abortions TAB SAB Ect Mult Living                  Review of Systems  Constitutional: Negative for activity change and appetite change.  HENT: Negative for neck stiffness.   Eyes: Negative for pain.  Respiratory: Negative for chest tightness and shortness of breath.   Cardiovascular: Negative for chest pain and leg swelling.  Gastrointestinal: Positive for nausea and abdominal pain. Negative for vomiting, diarrhea and constipation.  Genitourinary: Negative for flank pain.  Musculoskeletal: Negative for back pain.  Skin: Negative for rash.  Neurological: Positive for dizziness. Negative for weakness, numbness and headaches.  Psychiatric/Behavioral: Negative for behavioral problems.    Allergies  Review of patient's allergies indicates no known allergies.  Home Medications   Current Outpatient Rx  Name Route Sig Dispense Refill  . ALPRAZOLAM 0.5 MG PO TABS Oral Take 0.25 mg by mouth at bedtime.     Marland Kitchen BIOTIN 1000 MCG PO TABS Oral Take 1,000 mcg by mouth daily.     . CENTRUM SILVER PO Oral Take 1 tablet by mouth daily.      Marland Kitchen PRESERVISION AREDS 2 PO Oral Take 1 capsule by mouth 2 (two) times daily.     Marland Kitchen PANTOPRAZOLE SODIUM 40 MG PO  TBEC Oral Take 40 mg by mouth every morning.     Marland Kitchen PRAVASTATIN SODIUM 40 MG PO TABS Oral Take 40 mg by mouth at bedtime.     Marland Kitchen ONDANSETRON 8 MG PO TBDP Oral Take 1 tablet (8 mg total) by mouth every 8 (eight) hours as needed for nausea. 20 tablet 0    BP 147/77  Pulse 91  Temp(Src) 99.8 F (37.7 C) (Oral)  Resp 18  Ht 5' 4.5" (1.638 m)  Wt 130 lb (58.968 kg)  BMI 21.97 kg/m2  SpO2 95%  Physical Exam  Nursing note and vitals reviewed. Constitutional: She is oriented to person, place, and time. She appears well-developed and well-nourished.  HENT:  Head: Normocephalic and atraumatic.  Eyes: EOM are normal. Pupils are equal, round, and  reactive to light.  Neck: Normal range of motion. Neck supple.  Cardiovascular: Normal rate, regular rhythm and normal heart sounds.   No murmur heard. Pulmonary/Chest: Effort normal and breath sounds normal. No respiratory distress. She has no wheezes. She has no rales.  Abdominal: Soft. Bowel sounds are normal. She exhibits no distension. There is tenderness. There is no rebound and no guarding.       Tender bilateral upper abdomen and epigastric area. No rebound or guarding. No mass. No pulsatile mass palpated.  Musculoskeletal: Normal range of motion.  Neurological: She is alert and oriented to person, place, and time. No cranial nerve deficit.  Skin: Skin is warm and dry.  Psychiatric: She has a normal mood and affect. Her speech is normal.    ED Course  Procedures (including critical care time)  Labs Reviewed  COMPREHENSIVE METABOLIC PANEL - Abnormal; Notable for the following:    Sodium 132 (*)    Glucose, Bld 116 (*)    AST 42 (*)    GFR calc non Af Amer 79 (*)    All other components within normal limits  CBC  DIFFERENTIAL  LIPASE, BLOOD  TROPONIN I   Dg Chest 2 View  05/19/2011  *RADIOLOGY REPORT*  Clinical Data: Chest pain with nausea and epigastric discomfort.  CHEST - 2 VIEW  Comparison: 06/25/2004 radiographs.  Findings: The heart size and mediastinal contours are stable.  The lungs are mildly hyperinflated with mild biapical scarring.  There is no edema, confluent airspace opacity or pleural effusion.  The patient is status post lower cervical fusion.  There is a superior endplate compression deformity at L1 which does not have an acute appearance.  This does appear progressive from thoracic radiographs done 10/23/2007. There is apparent post-traumatic deformity of the sternum which also does not appear acute.  IMPRESSION: No acute cardiopulmonary process.  Stable chronic lung disease.  Apparent progression of L1 compression deformity from prior thoracic radiographs in  2009.  Original Report Authenticated By: Gerrianne Scale, M.D.   Ct Abdomen Pelvis W Contrast  05/19/2011  *RADIOLOGY REPORT*  Clinical Data: Upper abdominal pain, nausea, constipation and history of gastroesophageal reflux.  CT ABDOMEN AND PELVIS WITH CONTRAST  Technique:  Multidetector CT imaging of the abdomen and pelvis was performed following the standard protocol during bolus administration of intravenous contrast.  Contrast: OMNIPAQUE IOHEXOL 300 MG/ML IV SOLN  Comparison: None.  Findings: At the diaphragmatic hiatus, there is suggestion of asymmetric soft tissue thickening along the left lateral aspect of the GE junction.  This measures roughly 2 cm and component of the GE junction mass cannot be excluded.  Consider eventual correlation with endoscopy or fluoroscopic contrast imaging.  The rest of the bowel in the abdomen pelvis is unremarkable.  No evidence of bowel obstruction, inflammation or perforation.  No free fluid or abscess.  No hernias.  The bladder is moderately distended.  The liver, spleen, adrenal glands and kidneys are unremarkable.  The pancreas is normal in appearance.  There is at least one small calcified gallstone in the gallbladder.  No evidence of gallbladder distention or inflammation.  No masses or enlarged lymph nodes are identified.  Compression fracture of the L1 vertebral body present with approximately 60 - 70% loss of height anteriorly.  This was present by x-ray and 2009.  IMPRESSION: Suggestion of asymmetric soft tissues thickening along the left lateral aspect of the GE junction at the diaphragmatic hiatus. Underlying mass cannot be excluded.  Original Report Authenticated By: Reola Calkins, M.D.     1. Abdominal pain   2. Nausea   3. Mass of stomach      Date: 05/19/2011  Rate: 97  Rhythm: normal sinus rhythm  QRS Axis: normal  Intervals: normal  ST/T Wave abnormalities: normal  Conduction Disutrbances:q waves anteriorly  Narrative  Interpretation:   Old EKG Reviewed: changes noted    MDM  Patient comes in with nauseous this abdominal pain for the last several weeks. She is seeing Dr. Karilyn Cota for the same and recently had a colonoscopy. She's continued to have nauseousness and was accompanied with a little bit dizziness. EKG is reassuring. Lab work is reassuring. CT was done and shows a mass at the gastroesophageal junction. He is eccentric and about 2 cm. Patient was informed of the mass the possibility of cancer 2. She'll be discharged with Zofran to follow with Dr. Karilyn Cota.        Marcia Woods. Rubin Payor, MD 05/19/11 2155

## 2011-05-19 NOTE — ED Notes (Signed)
Pt drinking oral CT contrast, no requests at this time, will continue to monitor

## 2011-05-19 NOTE — ED Notes (Signed)
Pt tolerating oral contrast well, no requests at this time.

## 2011-05-25 ENCOUNTER — Other Ambulatory Visit (INDEPENDENT_AMBULATORY_CARE_PROVIDER_SITE_OTHER): Payer: Self-pay | Admitting: *Deleted

## 2011-05-25 ENCOUNTER — Ambulatory Visit (INDEPENDENT_AMBULATORY_CARE_PROVIDER_SITE_OTHER): Payer: Medicare Other | Admitting: Internal Medicine

## 2011-05-25 ENCOUNTER — Encounter (INDEPENDENT_AMBULATORY_CARE_PROVIDER_SITE_OTHER): Payer: Self-pay | Admitting: Internal Medicine

## 2011-05-25 ENCOUNTER — Encounter (INDEPENDENT_AMBULATORY_CARE_PROVIDER_SITE_OTHER): Payer: Self-pay | Admitting: *Deleted

## 2011-05-25 VITALS — BP 146/82 | HR 72 | Temp 99.4°F | Ht 64.0 in | Wt 135.2 lb

## 2011-05-25 DIAGNOSIS — R110 Nausea: Secondary | ICD-10-CM

## 2011-05-25 DIAGNOSIS — R935 Abnormal findings on diagnostic imaging of other abdominal regions, including retroperitoneum: Secondary | ICD-10-CM

## 2011-05-25 NOTE — Progress Notes (Signed)
Subjective:     Patient ID: Marcia Woods, female   DOB: 1926-05-07, 77 y.o.   MRN: [id number]  HPI  Marcia Woods is a 77 yr old female here today for a scheduled visit. She was seen for nausea and abdominal pain.  She tells me she has epigastric pain. She has nausea on occasion.  Her appetite is okay. No weight loss. Symptoms for 3 months. No dysphagia. She is requesting and EGD at this time.  She underwent a CT last week while in the ED for nausea and abdominal pain. 05/19/11  IMPRESSION:  Suggestion of asymmetric soft tissues thickening along the left  lateral aspect of the GE junction at the diaphragmatic hiatus.  Underlying mass cannot be excluded.     Colonoscopy in November of this year  Findings:  Prep excellent.  Normal mucosa of colon and rectum.  Normal anorectal junction.  Review of Systems Current Outpatient Prescriptions  Medication Sig Dispense Refill  . ALPRAZolam (XANAX) 0.5 MG tablet Take 0.25 mg by mouth at bedtime.       . Biotin 1000 MCG tablet Take 1,000 mcg by mouth daily.       . Multiple Vitamins-Minerals (CENTRUM SILVER PO) Take 1 tablet by mouth daily.        . Multiple Vitamins-Minerals (PRESERVISION AREDS 2 PO) Take 1 capsule by mouth 2 (two) times daily.       . ondansetron (ZOFRAN ODT) 8 MG disintegrating tablet Take 1 tablet (8 mg total) by mouth every 8 (eight) hours as needed for nausea.  20 tablet  0  . pantoprazole (PROTONIX) 40 MG tablet Take 40 mg by mouth every morning.       . pravastatin (PRAVACHOL) 40 MG tablet Take 40 mg by mouth at bedtime.        Past Medical History  Diagnosis Date  . Hyperlipemia   . Acid reflux   . Macular degeneration   . Constipation    Past Surgical History  Procedure Date  . Knee arthroscopy   . Trigger finger release   . Hemorrhoid surgery   . Cataract surgery     bilaterally  . Abdominal hysterectomy   . Colonoscopy 04/30/2011    Procedure: COLONOSCOPY;  Surgeon: Malissa Hippo, MD;  Location: [facility];  Service: Endoscopy;  Laterality: N/A;  8:30 am   History   Social History  . Marital Status: Widowed    Spouse Name: N/A    Number of Children: N/A  . Years of Education: N/A   Occupational History  . Not on file.   Social History Main Topics  . Smoking status: Never Smoker   . Smokeless tobacco: Not on file  . Alcohol Use: No  . Drug Use: No  . Sexually Active: Not on file   Other Topics Concern  . Not on file   Social History Narrative  . No narrative on file   Family Status  Relation Status Death Age  . Mother Deceased     MI  . Father Deceased      MI  . Sister Alive     one deceased from hepatitis C. Other in good health  . Brother Alive     good health  . Child Alive     One in good health. One in good health   No Known Allergies    Objective:   Physical ExamAlert and oriented. Skin warm and dry. Oral mucosa is moist. Natural teeth in good condition.  Sclera anicteric, conjunctivae is pink. Thyroid not enlarged. No cervical lymphadenopathy. Lungs clear. Heart regular rate and rhythm.  Abdomen is soft. Bowel sounds are positive. No hepatomegaly. No abdominal masses felt. Epigastric tenderness.  No edema to lower extremities. Patient is alert and oriented.      Assessment:    Epigastric pain. Abnormal CT.   Gastric mass needs to be ruled out.    Plan:    EGD.The risks and benefits such as perforation, bleeding, and infection were reviewed with the patient and is agreeable.  Marland Kitchen

## 2011-05-25 NOTE — Patient Instructions (Signed)
EGD in the near future to rule out mass

## 2011-05-26 ENCOUNTER — Encounter (HOSPITAL_COMMUNITY): Payer: Self-pay | Admitting: Pharmacy Technician

## 2011-05-26 MED ORDER — SODIUM CHLORIDE 0.45 % IV SOLN: Freq: Once | INTRAVENOUS | Status: DC

## 2011-05-27 ENCOUNTER — Encounter (HOSPITAL_COMMUNITY)
Admission: RE | Disposition: A | Discharge status: Home / Self Care | Payer: Self-pay | Source: Ambulatory Visit | Attending: Internal Medicine

## 2011-05-27 ENCOUNTER — Ambulatory Visit (HOSPITAL_COMMUNITY)
Admission: RE | Admit: 2011-05-27 | Discharge: 2011-05-27 | Disposition: A | Discharge status: Home / Self Care | Payer: Medicare Other | Source: Ambulatory Visit | Attending: Internal Medicine | Admitting: Internal Medicine

## 2011-05-27 DIAGNOSIS — R935 Abnormal findings on diagnostic imaging of other abdominal regions, including retroperitoneum: Secondary | ICD-10-CM

## 2011-05-27 DIAGNOSIS — R110 Nausea: Secondary | ICD-10-CM

## 2011-05-27 DIAGNOSIS — K449 Diaphragmatic hernia without obstruction or gangrene: Secondary | ICD-10-CM | POA: Insufficient documentation

## 2011-05-27 DIAGNOSIS — R1013 Epigastric pain: Secondary | ICD-10-CM | POA: Insufficient documentation

## 2011-05-27 DIAGNOSIS — E785 Hyperlipidemia, unspecified: Secondary | ICD-10-CM | POA: Insufficient documentation

## 2011-05-27 DIAGNOSIS — K219 Gastro-esophageal reflux disease without esophagitis: Secondary | ICD-10-CM

## 2011-05-27 DIAGNOSIS — R933 Abnormal findings on diagnostic imaging of other parts of digestive tract: Secondary | ICD-10-CM

## 2011-05-27 HISTORY — PX: ESOPHAGOGASTRODUODENOSCOPY: SHX5428

## 2011-05-27 SURGERY — EGD (ESOPHAGOGASTRODUODENOSCOPY)
Anesthesia: Moderate Sedation

## 2011-05-27 MED ORDER — BUTAMBEN-TETRACAINE-BENZOCAINE 2-2-14 % EX AERO
INHALATION_SPRAY | CUTANEOUS | Status: DC | PRN
  Administered 2011-05-27: 2 via TOPICAL

## 2011-05-27 MED ORDER — MIDAZOLAM HCL 5 MG/5ML IJ SOLN
INTRAMUSCULAR | Status: AC
  Filled 2011-05-27: qty 5

## 2011-05-27 MED ORDER — SUCRALFATE 1 GM/10ML PO SUSP: 1.0000 g | Freq: Four times a day (QID) | ORAL | Status: AC

## 2011-05-27 MED ORDER — STERILE WATER FOR IRRIGATION IR SOLN
Status: DC | PRN
  Administered 2011-05-27: 14:00:00

## 2011-05-27 MED ORDER — MEPERIDINE HCL 50 MG/ML IJ SOLN
INTRAMUSCULAR | Status: AC
  Filled 2011-05-27: qty 1

## 2011-05-27 MED ORDER — MEPERIDINE HCL 25 MG/ML IJ SOLN
INTRAMUSCULAR | Status: DC | PRN
  Administered 2011-05-27: 25 mg via INTRAVENOUS

## 2011-05-27 MED ORDER — MIDAZOLAM HCL 5 MG/5ML IJ SOLN
INTRAMUSCULAR | Status: DC | PRN
  Administered 2011-05-27: 2 mg via INTRAVENOUS
  Administered 2011-05-27: 1 mg via INTRAVENOUS
  Administered 2011-05-27: 2 mg via INTRAVENOUS

## 2011-05-27 NOTE — H&P (Signed)
This is an update to history and physical from 05/25/2011. Patient has been experiencing vague epigastric discomfort. She was evaluated in emergency room on 05/19/2011 and had abdominopelvic CT. Revealed soft tissue density in the region of cardia showing mass versus hiatal hernia. He also has calcified gallstone. Patient's symptom is not necessarily triggered with meals. She is undergoing diagnostic EGD.

## 2011-05-27 NOTE — Op Note (Signed)
EGD PROCEDURE REPORT  PATIENT:  Marcia Woods  MR#:  [id number] Birthdate:  08/30/1925, 77 y.o., female Endoscopist:  Dr. Malissa Hippo, MD Referred By:  Dr. Carylon Perches, MD Procedure Date: 05/27/2011  Procedure:   EGD  Indications:  Patient is 77 year old Caucasian female who's been experiencing vague epigastric pain and nausea. Her heartburn has resolved with pantoprazole but not the symptoms. Abdominopelvic CT shows question of a mass versus other at cardia. He is undergoing diagnostic esophagogastroduodenoscopy. Please note that patient had a colonoscopy last month or constipation and change in her bowel habits and was a normal exam             Informed Consent:  The risks, benefits, alternatives & imponderables which include, but are not limited to, bleeding, infection, perforation, drug reaction and potential missed lesion have been reviewed.  The potential for biopsy, lesion removal, esophageal dilation, etc. have also been discussed.  Questions have been answered.  All parties agreeable.  Please see history & physical in medical record for more information.  Medications:  Demerol 25 mg IV Versed 5 mg IV Cetacaine spray topically for oropharyngeal anesthesia  Description of procedure:  The endoscope was introduced through the mouth and advanced to the second portion of the duodenum without difficulty or limitations. The mucosal surfaces were surveyed very carefully during advancement of the scope and upon withdrawal.  Findings:  Esophagus:  Cosa of the esophagus was normal. GE junction 100 GEJ:  37 cm Hiatus:  39 cm Stomach:  Stomach was empty and distended very well with insufflation. Folds in the proximal stomach were normal. Mucosa at body, antrum, pyloric channel, angularis, fundus and cardia was normal. There was no mass at fundus or cardia Duodenum:  Normal bulbar and post bulbar mucosa  Therapeutic/Diagnostic Maneuvers Performed:  None  Complications:   None  Impression: Small sliding hiatal hernia otherwise normal esophagogastroduodenoscopy. Recent CT revealed calcified gallstone her symptoms are not typical of biliary tract disease.  Recommendations:  Continue therapy for GERD. Trial with Carafate 1 g a.c. and each bedtime. Progress  report in 2 weeks   REHMAN,NAJEEB U  05/27/2011  1:35 PM  CC: Dr. Carylon Perches, MD & Dr. Bonnetta Barry ref. provider found

## 2011-06-09 ENCOUNTER — Telehealth (INDEPENDENT_AMBULATORY_CARE_PROVIDER_SITE_OTHER): Payer: Self-pay | Admitting: *Deleted

## 2011-06-09 ENCOUNTER — Encounter (HOSPITAL_COMMUNITY): Payer: Self-pay | Admitting: Internal Medicine

## 2011-06-09 NOTE — Telephone Encounter (Signed)
Would like for Tammy to give her a return call. The phone number is [phone].

## 2011-06-09 NOTE — Telephone Encounter (Signed)
Progress Report:The patient states that she is feeling much better. She has not been sick at all. Stomach feel sore but she related that to her having a cough due to a cold. She only has to Carafate tablets left,will take at Supper and Bedtime tonight. Marcia Woods says that a few days off she will know if it was working or not, if needed she will call for a refill request. Dr. Karilyn Cota to be made aware.

## 2011-06-12 NOTE — Telephone Encounter (Signed)
Noted. That she is feeling better

## 2011-12-30 IMAGING — CR DG CHEST 2V
2 series · 2 of 2 positions shown · non-contrast
Comparison: 06/25/2004 radiographs.

CLINICAL DATA: Chest pain with nausea and epigastric discomfort.

CHEST - 2 VIEW

[view not recorded (1 of 2)]
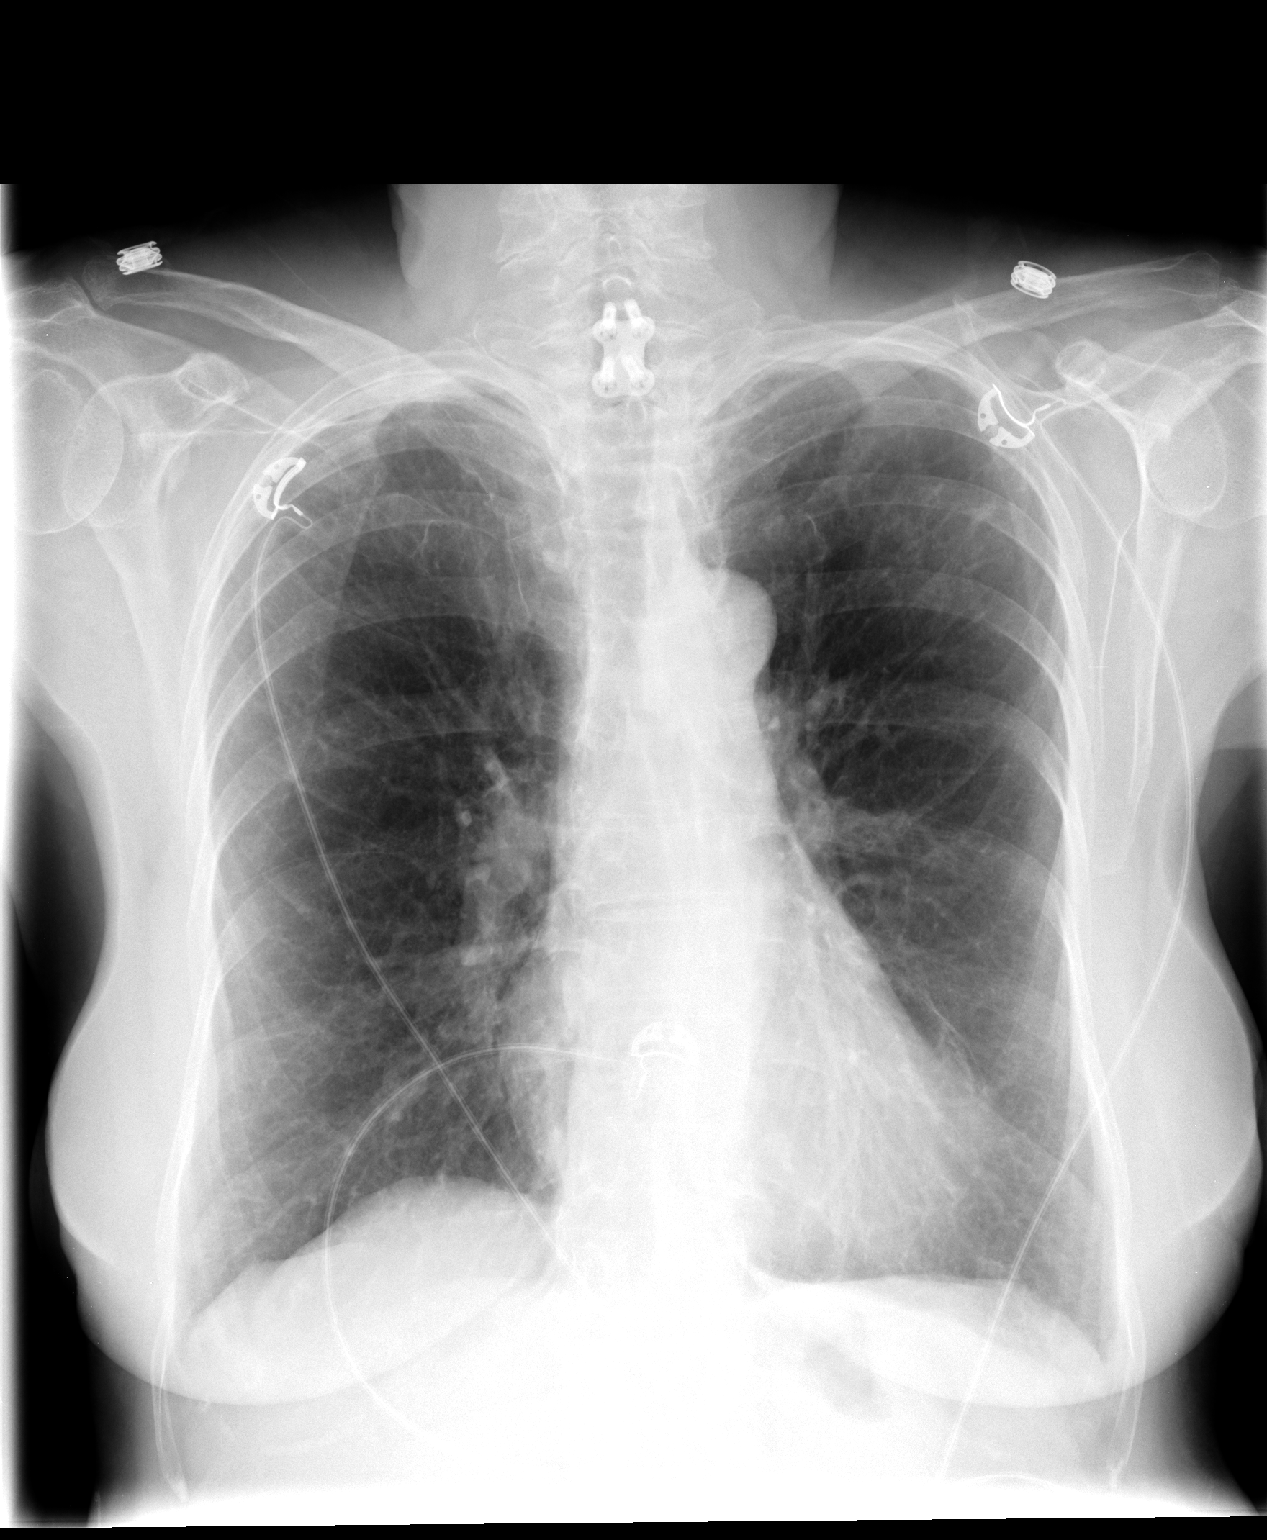

[view not recorded (2 of 2)]
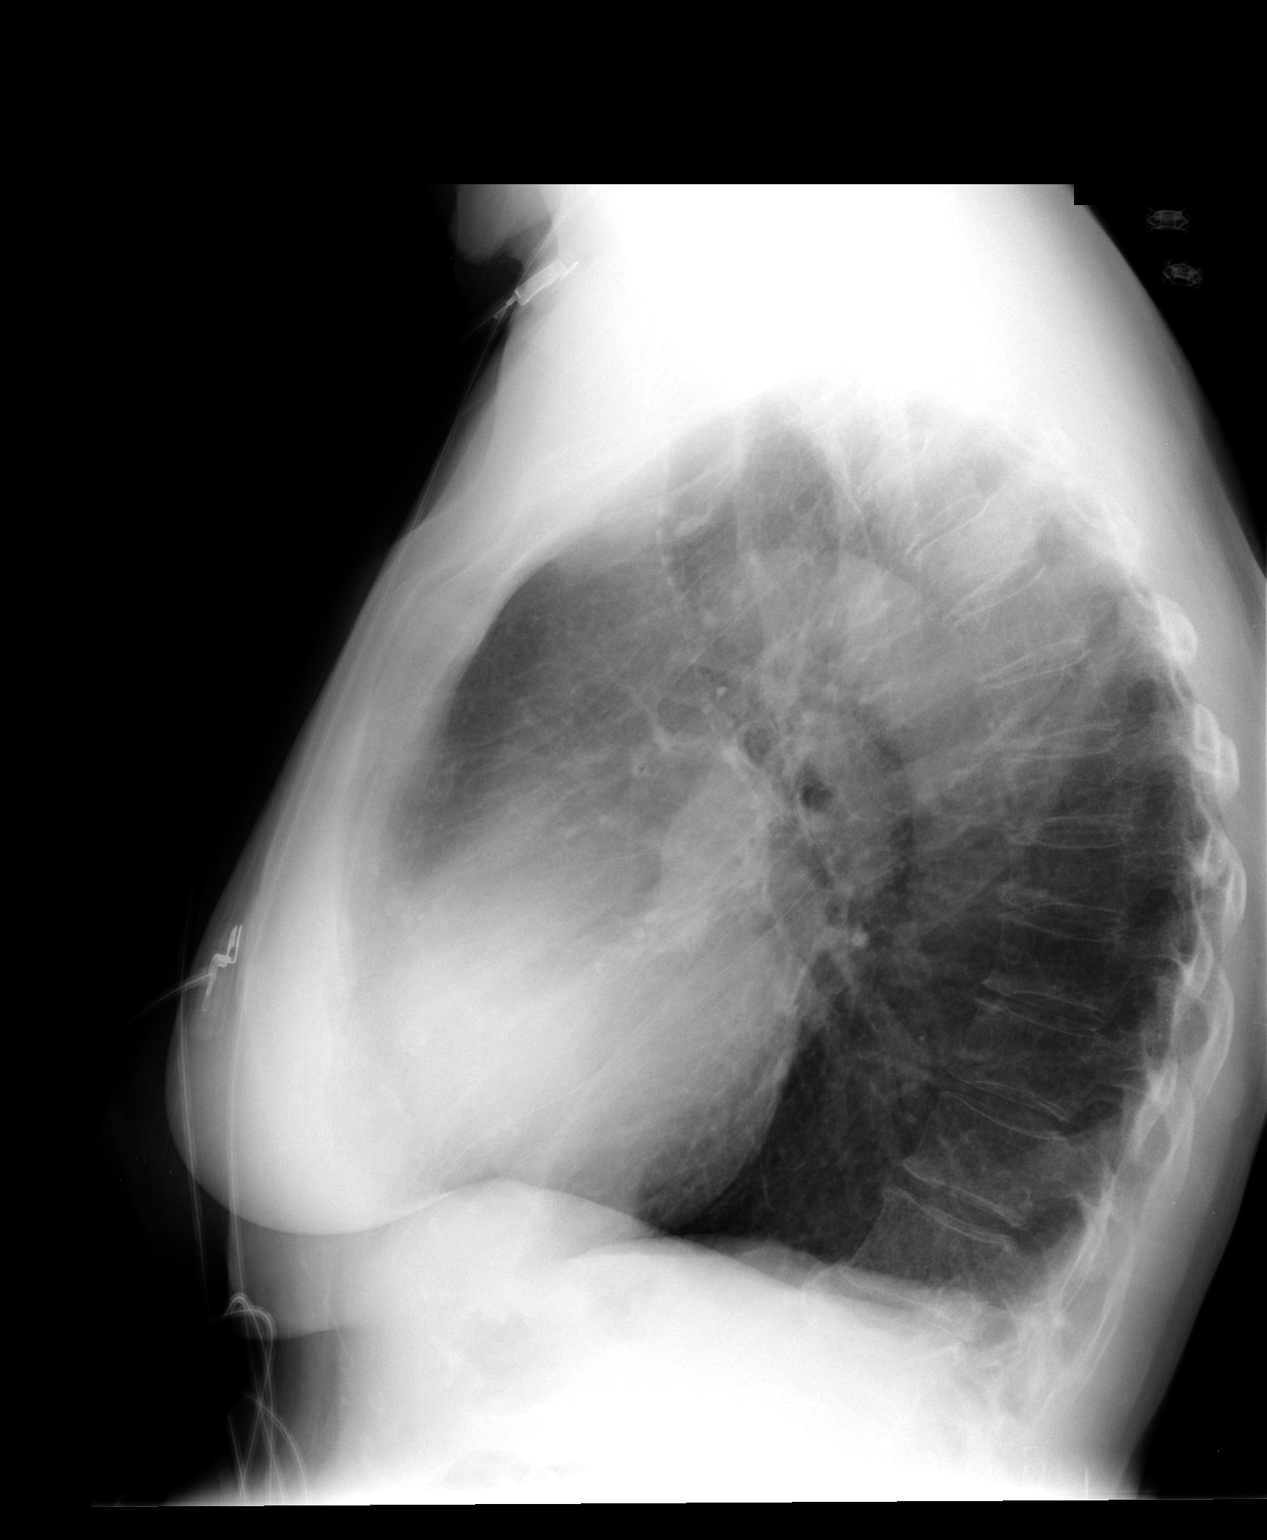

[2 of 2 positions shown; findings below may reference images not displayed]

FINDINGS: The heart size and mediastinal contours are stable.  The
lungs are mildly hyperinflated with mild biapical scarring.  There
is no edema, confluent airspace opacity or pleural effusion.

The patient is status post lower cervical fusion.  There is a
superior endplate compression deformity at L1 which does not have
an acute appearance.  This does appear progressive from thoracic
radiographs done 10/23/2007. There is apparent post-traumatic
deformity of the sternum which also does not appear acute.
IMPRESSION: No acute cardiopulmonary process.  Stable chronic lung disease.

Apparent progression of L1 compression deformity from prior
thoracic radiographs in [DATE].

## 2012-01-05 ENCOUNTER — Other Ambulatory Visit (HOSPITAL_COMMUNITY): Payer: Self-pay | Admitting: Internal Medicine

## 2012-01-05 DIAGNOSIS — Z139 Encounter for screening, unspecified: Secondary | ICD-10-CM

## 2012-01-11 ENCOUNTER — Telehealth (INDEPENDENT_AMBULATORY_CARE_PROVIDER_SITE_OTHER): Payer: Self-pay | Admitting: *Deleted

## 2012-01-11 NOTE — Telephone Encounter (Signed)
She needs OV with her surgeon.

## 2012-01-11 NOTE — Telephone Encounter (Signed)
Marcia Woods called and states that she is having Nausea , sick on stomach, and hurts at the top of her stomach and chest bone. She has been told that her Gallbladder is the problem and she says that she has a hiatal hernia. She just is not sure of what to do she would appreciate Dr.Rehman's suggestions. She may be reached at [phone]

## 2012-01-12 NOTE — Telephone Encounter (Signed)
Marcia Woods request that we make a referral to Dr.Jenkins for consult regarding possible Gallbladder Surgery. Dr. Karilyn Cota had recommended that she see a Surgeon. Patient is having Nausea, Abdominal Discomfort and also pain at the Chest Bone. She may be reached at [phone]

## 2012-01-12 NOTE — Telephone Encounter (Signed)
appt w/ Dr Lovell Sheehan 01/25/12 @ 145, patient aware, notes faxed

## 2012-01-18 ENCOUNTER — Encounter (HOSPITAL_COMMUNITY): Payer: Self-pay | Admitting: Pharmacy Technician

## 2012-01-18 NOTE — H&P (Signed)
  [facility] SOAP Note  Vital Signs:  Vitals as of: 01/18/2012: Systolic 176: Diastolic 87: Heart Rate 67: Temp 98.43F: Height 49ft 4in: Weight 131Lbs 0 Ounces: BMI 22  BMI : 22.49 kg/m2  Subjective: This 59 Years 42 Months old Female presents for of    ABDOMINAL ISSUES: ,Has been having intermittent episodes of right upper quadrant abdominal pain, nausea, and intermittent vomiting.  Workup in past negative except for cholelithiasis.  Medications have not been helpful.  Denies fever, chills, jaundice.  Review of Symptoms:  Constitutional:unremarkable   Head:unremarkable    Eyes:unremarkable   Nose/Mouth/Throat:unremarkable Cardiovascular:  unremarkable   Respiratory:unremarkable   Gastrointestinal:  unremarkable   Genitourinary:unremarkable       joint pain Skin:unremarkable Hematolgic/Lymphatic:unremarkable     Allergic/Immunologic:unremarkable     Past Medical History:    Reviewed   Past Medical History  Surgical History: knee, neck Medical Problems:  High cholesterol Allergies: nkda Medications: simvastatin   Social History:Reviewed  Social History  Preferred Language: English ([place]) Race:  White Ethnicity: Not Hispanic / Latino Age: 87 Years 8 Months Alcohol:  No Recreational drug(s):  No   Smoking Status: Never smoker reviewed on 01/18/2012  Family History:  Reviewed   Family History              Father:  Hypertension, Coronary Artery Disease             Mother:  Hypertension, Coronary Artery Disease    Objective Information: General:  Well appearing, well nourished in no distress.   no scleral icterus Neck:  Supple without lymphadenopathy.  Heart:  RRR, no murmur Lungs:    CTA bilaterally, no wheezes, rhonchi, rales.  Breathing unlabored. Abdomen:Soft, slight discomfort to palpation, right upper quadrant, ND, no HSM, no masses.  Assessment:Biliary colic,  cholelithiasis  Diagnosis &amp; Procedure: DiagnosisCode: 574.20, ProcedureCode: 78295,    Plan:Scheduled for laparoscopic cholecystectomy on 01/26/12.   Patient Education:Alternative treatments to surgery were discussed with patient (and family).  Risks and benefits  of procedure were fully explained to the patient (and family) who gave informed consent. Patient/family questions were addressed.  Follow-up:Pending Surgery

## 2012-01-20 NOTE — Patient Instructions (Addendum)
20 Marcia Woods  01/20/2012   Your procedure is scheduled on:   01/26/2012  Report to [facility] at  700  AM.  Call this number if you have problems the morning of surgery: [phone]   Remember:   Do not eat food:After Midnight.  May have clear liquids:until Midnight .    Take these medicines the morning of surgery with A SIP OF WATER:  Xanax,protonix   Do not wear jewelry, make-up or nail polish.  Do not wear lotions, powders, or perfumes. You may wear deodorant.  Do not shave 48 hours prior to surgery. Men may shave face and neck.  Do not bring valuables to the hospital.  Contacts, dentures or bridgework may not be worn into surgery.  Leave suitcase in the car. After surgery it may be brought to your room.  For patients admitted to the hospital, checkout time is 11:00 AM the day of discharge.   Patients discharged the day of surgery will not be allowed to drive home.  Name and phone number of your driver: family  Special Instructions: CHG Shower Use Special Wash: 1/2 bottle night before surgery and 1/2 bottle morning of surgery.   Please read over the following fact sheets that you were given: Pain Booklet, MRSA Information, Surgical Site Infection Prevention, Anesthesia Post-op Instructions and Care and Recovery After Surgery Laparoscopic Cholecystectomy Laparoscopic cholecystectomy is surgery to remove the gallbladder. The gallbladder is located slightly to the right of center in the abdomen, behind the liver. It is a concentrating and storage sac for the bile produced in the liver. Bile aids in the digestion and absorption of fats. Gallbladder disease (cholecystitis) is an inflammation of your gallbladder. This condition is usually caused by a buildup of gallstones (cholelithiasis) in your gallbladder. Gallstones can block the flow of bile, resulting in inflammation and pain. In severe cases, emergency surgery may be required. When emergency surgery is not required, you will  have time to prepare for the procedure. Laparoscopic surgery is an alternative to open surgery. Laparoscopic surgery usually has a shorter recovery time. Your common bile duct may also need to be examined and explored. Your caregiver will discuss this with you if he or she feels this should be done. If stones are found in the common bile duct, they may be removed. LET YOUR CAREGIVER KNOW ABOUT:  Allergies to food or medicine.   Medicines taken, including vitamins, herbs, eyedrops, over-the-counter medicines, and creams.   Use of steroids (by mouth or creams).   Previous problems with anesthetics or numbing medicines.   History of bleeding problems or blood clots.   Previous surgery.   Other health problems, including diabetes and kidney problems.   Possibility of pregnancy, if this applies.  RISKS AND COMPLICATIONS All surgery is associated with risks. Some problems that may occur following this procedure include:  Infection.   Damage to the common bile duct, nerves, arteries, veins, or other internal organs such as the stomach or intestines.   Bleeding.   A stone may remain in the common bile duct.  BEFORE THE PROCEDURE  Do not take aspirin for 3 days prior to surgery or blood thinners for 1 week prior to surgery.   Do not eat or drink anything after midnight the night before surgery.   Let your caregiver know if you develop a cold or other infectious problem prior to surgery.   You should be present 60 minutes before the procedure or as directed.  PROCEDURE  You will be given medicine that makes you sleep (general anesthetic). When you are asleep, your surgeon will make several small cuts (incisions) in your abdomen. One of these incisions is used to insert a small, lighted scope (laparoscope) into the abdomen. The laparoscope helps the surgeon see into your abdomen. Carbon dioxide gas will be pumped into your abdomen. The gas allows more room for the surgeon to perform your  surgery. Other operating instruments are inserted through the other incisions. Laparoscopic procedures may not be appropriate when:  There is major scarring from previous surgery.   The gallbladder is extremely inflamed.   There are bleeding disorders or unexpected cirrhosis of the liver.   A pregnancy is near term.   Other conditions make the laparoscopic procedure impossible.  If your surgeon feels it is not safe to continue with a laparoscopic procedure, he or she will perform an open abdominal procedure. In this case, the surgeon will make an incision to open the abdomen. This gives the surgeon a larger view and field to work within. This may allow the surgeon to perform procedures that sometimes cannot be performed with a laparoscope alone. Open surgery has a longer recovery time. AFTER THE PROCEDURE  You will be taken to the recovery area where a nurse will watch and check your progress.   You may be allowed to go home the same day.   Do not resume physical activities until directed by your caregiver.   You may resume a normal diet and activities as directed.  Document Released: 06/07/2005 Document Revised: 05/27/2011 Document Reviewed: 11/20/2010 [facility] Patient Information 2012 [place], [place].PATIENT INSTRUCTIONS POST-ANESTHESIA  IMMEDIATELY FOLLOWING SURGERY:  Do not drive or operate machinery for the first twenty four hours after surgery.  Do not make any important decisions for twenty four hours after surgery or while taking narcotic pain medications or sedatives.  If you develop intractable nausea and vomiting or a severe headache please notify your doctor immediately.  FOLLOW-UP:  Please make an appointment with your surgeon as instructed. You do not need to follow up with anesthesia unless specifically instructed to do so.  WOUND CARE INSTRUCTIONS (if applicable):  Keep a dry clean dressing on the anesthesia/puncture wound site if there is drainage.  Once the wound has  quit draining you may leave it open to air.  Generally you should leave the bandage intact for twenty four hours unless there is drainage.  If the epidural site drains for more than 36-48 hours please call the anesthesia department.  QUESTIONS?:  Please feel free to call your physician or the hospital operator if you have any questions, and they will be happy to assist you.

## 2012-01-21 ENCOUNTER — Encounter (HOSPITAL_COMMUNITY): Payer: Self-pay

## 2012-01-21 ENCOUNTER — Encounter (HOSPITAL_COMMUNITY)
Admission: RE | Admit: 2012-01-21 | Discharge: 2012-01-21 | Disposition: A | Discharge status: Home / Self Care | Payer: Medicare Other | Source: Ambulatory Visit | Attending: General Surgery | Admitting: General Surgery

## 2012-01-21 HISTORY — DX: Anxiety disorder, unspecified: F41.9

## 2012-01-21 HISTORY — DX: Unspecified osteoarthritis, unspecified site: M19.90

## 2012-01-21 LAB — CBC WITH DIFFERENTIAL/PLATELET
Basophils Relative: 1 % (ref 0–1)
HCT: 38 % (ref 36.0–46.0)
Hemoglobin: 12.6 g/dL (ref 12.0–15.0)
Lymphocytes Relative: 37 % (ref 12–46)
MCHC: 33.2 g/dL (ref 30.0–36.0)
Monocytes Absolute: 0.4 10*3/uL (ref 0.1–1.0)
Monocytes Relative: 10 % (ref 3–12)
Neutro Abs: 2 10*3/uL (ref 1.7–7.7)
Neutrophils Relative %: 52 % (ref 43–77)
RBC: 4.17 MIL/uL (ref 3.87–5.11)
WBC: 3.9 10*3/uL — ABNORMAL LOW (ref 4.0–10.5)

## 2012-01-21 LAB — SURGICAL PCR SCREEN
MRSA, PCR: NEGATIVE
Staphylococcus aureus: NEGATIVE

## 2012-01-21 LAB — BASIC METABOLIC PANEL
BUN: 14 mg/dL (ref 6–23)
Chloride: 102 mEq/L (ref 96–112)
GFR calc Af Amer: 88 mL/min — ABNORMAL LOW (ref >90)
GFR calc non Af Amer: 76 mL/min — ABNORMAL LOW (ref >90)
Potassium: 4.1 mEq/L (ref 3.5–5.1)
Sodium: 138 mEq/L (ref 135–145)

## 2012-01-21 LAB — HEPATIC FUNCTION PANEL
Bilirubin, Direct: <0.1 mg/dL (ref 0.0–0.3)
Total Bilirubin: 0.4 mg/dL (ref 0.3–1.2)

## 2012-01-26 ENCOUNTER — Ambulatory Visit (HOSPITAL_COMMUNITY): Payer: Medicare Other | Admitting: Anesthesiology

## 2012-01-26 ENCOUNTER — Encounter (HOSPITAL_COMMUNITY)
Admission: RE | Disposition: A | Discharge status: Home / Self Care | Payer: Self-pay | Source: Ambulatory Visit | Attending: General Surgery

## 2012-01-26 ENCOUNTER — Encounter (HOSPITAL_COMMUNITY): Payer: Self-pay | Admitting: *Deleted

## 2012-01-26 ENCOUNTER — Observation Stay (HOSPITAL_COMMUNITY)
Admission: RE | Admit: 2012-01-26 | Discharge: 2012-01-27 | Disposition: A | Discharge status: Home / Self Care | Payer: Medicare Other | Source: Ambulatory Visit | Attending: General Surgery | Admitting: General Surgery

## 2012-01-26 ENCOUNTER — Encounter (HOSPITAL_COMMUNITY): Payer: Self-pay | Admitting: Anesthesiology

## 2012-01-26 DIAGNOSIS — K8010 Calculus of gallbladder with chronic cholecystitis without obstruction: Principal | ICD-10-CM | POA: Insufficient documentation

## 2012-01-26 DIAGNOSIS — E7800 Pure hypercholesterolemia, unspecified: Secondary | ICD-10-CM | POA: Insufficient documentation

## 2012-01-26 DIAGNOSIS — Z01812 Encounter for preprocedural laboratory examination: Secondary | ICD-10-CM | POA: Insufficient documentation

## 2012-01-26 HISTORY — PX: CHOLECYSTECTOMY: SHX55

## 2012-01-26 SURGERY — LAPAROSCOPIC CHOLECYSTECTOMY
Anesthesia: General | Site: Abdomen | Wound class: Clean Contaminated

## 2012-01-26 MED ORDER — ALPRAZOLAM 0.5 MG PO TABS
0.5000 mg | ORAL_TABLET | Freq: Every day | ORAL | Status: DC
Start: 2012-01-26 — End: 2012-01-27
  Administered 2012-01-26: 0.5 mg via ORAL
  Filled 2012-01-26: qty 1

## 2012-01-26 MED ORDER — HEMOSTATIC AGENTS (NO CHARGE) OPTIME
TOPICAL | Status: DC | PRN
  Administered 2012-01-26: 1 via TOPICAL

## 2012-01-26 MED ORDER — BUPIVACAINE HCL (PF) 0.5 % IJ SOLN
INTRAMUSCULAR | Status: AC
  Filled 2012-01-26: qty 30

## 2012-01-26 MED ORDER — NEOSTIGMINE METHYLSULFATE 1 MG/ML IJ SOLN
INTRAMUSCULAR | Status: AC
  Filled 2012-01-26: qty 10

## 2012-01-26 MED ORDER — ONDANSETRON HCL 4 MG PO TABS: 4.0000 mg | ORAL_TABLET | Freq: Four times a day (QID) | ORAL | Status: DC | PRN

## 2012-01-26 MED ORDER — CEFAZOLIN SODIUM-DEXTROSE 2-3 GM-% IV SOLR
INTRAVENOUS | Status: AC
  Filled 2012-01-26: qty 50

## 2012-01-26 MED ORDER — ONDANSETRON HCL 4 MG/2ML IJ SOLN
4.0000 mg | Freq: Once | INTRAMUSCULAR | Status: AC
  Administered 2012-01-26: 4 mg via INTRAVENOUS

## 2012-01-26 MED ORDER — CEFAZOLIN SODIUM-DEXTROSE 2-3 GM-% IV SOLR
2.0000 g | INTRAVENOUS | Status: AC
  Administered 2012-01-26: 2 g via INTRAVENOUS

## 2012-01-26 MED ORDER — LACTATED RINGERS IV SOLN
INTRAVENOUS | Status: DC
  Administered 2012-01-26: 13:00:00 via INTRAVENOUS

## 2012-01-26 MED ORDER — PANTOPRAZOLE SODIUM 40 MG PO TBEC: 40.0000 mg | DELAYED_RELEASE_TABLET | ORAL | Status: DC

## 2012-01-26 MED ORDER — B COMPLEX-C PO TABS
1.0000 | ORAL_TABLET | Freq: Every day | ORAL | Status: DC
  Filled 2012-01-26 (×3): qty 1

## 2012-01-26 MED ORDER — ONDANSETRON HCL 4 MG/2ML IJ SOLN
INTRAMUSCULAR | Status: AC
  Filled 2012-01-26: qty 2

## 2012-01-26 MED ORDER — ETOMIDATE 2 MG/ML IV SOLN
INTRAVENOUS | Status: DC | PRN
  Administered 2012-01-26: 16 mg via INTRAVENOUS
  Administered 2012-01-26: 4 mg via INTRAVENOUS

## 2012-01-26 MED ORDER — ONDANSETRON HCL 4 MG/2ML IJ SOLN
4.0000 mg | Freq: Four times a day (QID) | INTRAMUSCULAR | Status: DC | PRN
  Administered 2012-01-26: 4 mg via INTRAVENOUS
  Filled 2012-01-26: qty 2

## 2012-01-26 MED ORDER — GLYCOPYRROLATE 0.2 MG/ML IJ SOLN
INTRAMUSCULAR | Status: AC
  Filled 2012-01-26: qty 1

## 2012-01-26 MED ORDER — FENTANYL CITRATE 0.05 MG/ML IJ SOLN
INTRAMUSCULAR | Status: AC
  Filled 2012-01-26: qty 2

## 2012-01-26 MED ORDER — SODIUM CHLORIDE 0.9 % IR SOLN
Status: DC | PRN
  Administered 2012-01-26: 1000 mL

## 2012-01-26 MED ORDER — ENOXAPARIN SODIUM 30 MG/0.3ML ~~LOC~~ SOLN
30.0000 mg | SUBCUTANEOUS | Status: DC
  Administered 2012-01-27: 30 mg via SUBCUTANEOUS
  Filled 2012-01-26: qty 0.3

## 2012-01-26 MED ORDER — FENTANYL CITRATE 0.05 MG/ML IJ SOLN
INTRAMUSCULAR | Status: DC | PRN
  Administered 2012-01-26: 50 ug via INTRAVENOUS
  Administered 2012-01-26 (×2): 25 ug via INTRAVENOUS

## 2012-01-26 MED ORDER — MIDAZOLAM HCL 2 MG/2ML IJ SOLN
INTRAMUSCULAR | Status: AC
  Filled 2012-01-26: qty 2

## 2012-01-26 MED ORDER — MIDAZOLAM HCL 2 MG/2ML IJ SOLN
1.0000 mg | INTRAMUSCULAR | Status: DC | PRN
Start: 2012-01-26 — End: 2012-01-26
  Administered 2012-01-26: 2 mg via INTRAVENOUS

## 2012-01-26 MED ORDER — GLYCOPYRROLATE 0.2 MG/ML IJ SOLN
0.2000 mg | Freq: Once | INTRAMUSCULAR | Status: AC
  Administered 2012-01-26: 0.2 mg via INTRAVENOUS

## 2012-01-26 MED ORDER — NEOSTIGMINE METHYLSULFATE 1 MG/ML IJ SOLN
INTRAMUSCULAR | Status: DC | PRN
  Administered 2012-01-26: 2 mg via INTRAVENOUS

## 2012-01-26 MED ORDER — ONDANSETRON HCL 4 MG/2ML IJ SOLN
4.0000 mg | Freq: Once | INTRAMUSCULAR | Status: AC | PRN
  Administered 2012-01-26: 4 mg via INTRAVENOUS

## 2012-01-26 MED ORDER — SIMVASTATIN 20 MG PO TABS
20.0000 mg | ORAL_TABLET | Freq: Every day | ORAL | Status: DC
  Administered 2012-01-26: 20 mg via ORAL
  Filled 2012-01-26: qty 1

## 2012-01-26 MED ORDER — FENTANYL CITRATE 0.05 MG/ML IJ SOLN
25.0000 ug | INTRAMUSCULAR | Status: DC | PRN
  Administered 2012-01-26 (×4): 25 ug via INTRAVENOUS

## 2012-01-26 MED ORDER — GLYCOPYRROLATE 0.2 MG/ML IJ SOLN
INTRAMUSCULAR | Status: AC
  Filled 2012-01-26: qty 2

## 2012-01-26 MED ORDER — HYDROCODONE-ACETAMINOPHEN 5-325 MG PO TABS: 1.0000 | ORAL_TABLET | ORAL | Status: DC | PRN

## 2012-01-26 MED ORDER — ETOMIDATE 2 MG/ML IV SOLN
INTRAVENOUS | Status: AC
  Filled 2012-01-26: qty 10

## 2012-01-26 MED ORDER — LACTATED RINGERS IV SOLN
INTRAVENOUS | Status: DC
  Administered 2012-01-26: 08:00:00 via INTRAVENOUS

## 2012-01-26 MED ORDER — ROCURONIUM BROMIDE 100 MG/10ML IV SOLN
INTRAVENOUS | Status: DC | PRN
  Administered 2012-01-26: 30 mg via INTRAVENOUS

## 2012-01-26 MED ORDER — ROCURONIUM BROMIDE 50 MG/5ML IV SOLN
INTRAVENOUS | Status: AC
  Filled 2012-01-26: qty 1

## 2012-01-26 MED ORDER — BUPIVACAINE HCL 0.5 % IJ SOLN
INTRAMUSCULAR | Status: DC | PRN
  Administered 2012-01-26: 10 mL

## 2012-01-26 MED ORDER — GLYCOPYRROLATE 0.2 MG/ML IJ SOLN
INTRAMUSCULAR | Status: DC | PRN
  Administered 2012-01-26: 0.4 mg via INTRAVENOUS

## 2012-01-26 MED ORDER — MORPHINE SULFATE 2 MG/ML IJ SOLN: 2.0000 mg | INTRAMUSCULAR | Status: DC | PRN

## 2012-01-26 SURGICAL SUPPLY — 34 items
APPLIER CLIP ROT 10 11.4 M/L (STAPLE) ×2
APR CLP MED LRG 11.4X10 (STAPLE) ×1
BAG HAMPER (MISCELLANEOUS) ×2 IMPLANT
BAG SPEC RTRVL LRG 6X4 10 (ENDOMECHANICALS) ×1
CLIP APPLIE ROT 10 11.4 M/L (STAPLE) ×1 IMPLANT
CLOTH BEACON ORANGE TIMEOUT ST (SAFETY) ×2 IMPLANT
COVER LIGHT HANDLE STERIS (MISCELLANEOUS) ×4 IMPLANT
DECANTER SPIKE VIAL GLASS SM (MISCELLANEOUS) ×2 IMPLANT
DURAPREP 26ML APPLICATOR (WOUND CARE) ×2 IMPLANT
ELECT REM PT RETURN 9FT ADLT (ELECTROSURGICAL) ×2
ELECTRODE REM PT RTRN 9FT ADLT (ELECTROSURGICAL) ×1 IMPLANT
FILTER SMOKE EVAC LAPAROSHD (FILTER) ×2 IMPLANT
FORMALIN 10 PREFIL 120ML (MISCELLANEOUS) ×2 IMPLANT
GLOVE BIO SURGEON STRL SZ7.5 (GLOVE) ×2 IMPLANT
GLOVE BIOGEL PI IND STRL 7.0 (GLOVE) ×2 IMPLANT
GLOVE BIOGEL PI INDICATOR 7.0 (GLOVE) ×2
GLOVE ECLIPSE 6.5 STRL STRAW (GLOVE) ×4 IMPLANT
GOWN STRL REIN XL XLG (GOWN DISPOSABLE) ×6 IMPLANT
HEMOSTAT SNOW SURGICEL 2X4 (HEMOSTASIS) ×2 IMPLANT
INST SET LAPROSCOPIC AP (KITS) ×2 IMPLANT
KIT ROOM TURNOVER APOR (KITS) ×2 IMPLANT
KIT TROCAR LAP CHOLE (TROCAR) ×2 IMPLANT
MANIFOLD NEPTUNE II (INSTRUMENTS) ×2 IMPLANT
NS IRRIG 1000ML POUR BTL (IV SOLUTION) ×2 IMPLANT
PACK LAP CHOLE LZT030E (CUSTOM PROCEDURE TRAY) ×2 IMPLANT
PAD ARMBOARD 7.5X6 YLW CONV (MISCELLANEOUS) ×2 IMPLANT
POUCH SPECIMEN RETRIEVAL 10MM (ENDOMECHANICALS) ×2 IMPLANT
SET BASIN LINEN APH (SET/KITS/TRAYS/PACK) ×2 IMPLANT
SPONGE GAUZE 2X2 8PLY STRL LF (GAUZE/BANDAGES/DRESSINGS) ×8 IMPLANT
STAPLER VISISTAT (STAPLE) ×2 IMPLANT
SUT VICRYL 0 UR6 27IN ABS (SUTURE) ×2 IMPLANT
TAPE CLOTH SURG 4X10 WHT LF (GAUZE/BANDAGES/DRESSINGS) ×2 IMPLANT
WARMER LAPAROSCOPE (MISCELLANEOUS) ×2 IMPLANT
YANKAUER SUCT 12FT TUBE ARGYLE (SUCTIONS) ×2 IMPLANT

## 2012-01-26 NOTE — Op Note (Signed)
Patient:  Marcia Woods  DOB:  1926/02/01  MRN:  [id number]   Preop Diagnosis:  Cholecystitis, cholelithiasis  Postop Diagnosis:  Same  Procedure:  Laparoscopic cholecystectomy  Surgeon:  Franky Macho, M.D.  Anes:  General endotracheal  Indications:  Patient is an 78 year old white female presents with cholecystitis secondary to cholelithiasis. The risks and benefits of the procedure including bleeding, infection, hepatobiliary injury, the possibly of an open procedure were fully explained to the patient, gave informed consent.  Procedure note:  Patient is placed the supine position. After induction of general endotracheal anesthesia, the abdomen was prepped and draped using usual sterile technique with DuraPrep. Surgical site confirmation was performed.  An infraumbilical incision was made down to the fascia. A Veress needle was introduced into the abdominal cavity and confirmation of placement was done using the saline drop test. The abdomen was then insufflated to 16 mm mercury pressure. An 11 mm trocar was introduced into the abdominal cavity under direct visualization without difficulty. The patient is placed in reverse Trendelenburg position and additional 11 mm trocar was placed the epigastric region. 5 mm trochars were then placed in the right upper quadrant and right flank regions. Liver was inspected and noted to be age appropriate. The gallbladder was retracted superiorly and laterally. Dissection was begun around the infundibulum of the gallbladder. The cystic duct was first identified. Its junction to the infundibulum flow identified. Endoclips placed proximally distally on the cystic duct and cystic duct was divided. This is likewise done to the cystic artery. The gallbladder was then freed away from the gallbladder fossa using Bovie electrocautery. The gallbladder was delivered through the epigastric trocar site using an Endo Catch bag. The gallbladder fossa was inspected and no  abnormal bleeding or bile leakage was noted. Surgicel is placed the gallbladder fossa. All fluid and air were then evacuated from the abdominal cavity prior to removal of the trochars.  All wounds were gave normal saline. All wounds were injected with 0.5% Sensorcaine. The infraumbilical fashion as well as epigastric fascia reapproximated using 0 Vicryl interrupted sutures. All skin incisions were closed using staples. Betadine ointment dry sterile dressings were applied.  All tape and needle counts were correct at the end of the procedure. Patient was extubated in the operating room and went back to recovery room awake in stable condition.  Complications:  None  EBL:  Minimal  Specimen:  Gallbladder

## 2012-01-26 NOTE — Anesthesia Postprocedure Evaluation (Signed)
Anesthesia Post Note  Patient: Marcia Woods  Procedure(s) Performed: Procedure(s) (LRB): LAPAROSCOPIC CHOLECYSTECTOMY (N/A)  Anesthesia type: General  Patient location: PACU  Post pain: Pain level controlled  Post assessment: Post-op Vital signs reviewed, Patient's Cardiovascular Status Stable, Respiratory Function Stable, Patent Airway, No signs of Nausea or vomiting and Pain level controlled  Last Vitals:  Filed Vitals:   01/26/12 0940  BP: 164/83  Pulse: 71  Temp: 36.4 C  Resp: 20    Post vital signs: Reviewed and stable  Level of consciousness: awake and alert   Complications: No apparent anesthesia complications

## 2012-01-26 NOTE — Anesthesia Procedure Notes (Signed)
Procedure Name: Intubation Date/Time: 01/26/2012 8:56 AM Performed by: Franco Nones Pre-anesthesia Checklist: Patient identified, Patient being monitored, Timeout performed, Emergency Drugs available and Suction available Patient Re-evaluated:Patient Re-evaluated prior to inductionOxygen Delivery Method: Circle System Utilized Preoxygenation: Pre-oxygenation with 100% oxygen Intubation Type: IV induction Ventilation: Mask ventilation without difficulty Laryngoscope Size: Miller and 2 Grade View: Grade I Tube type: Oral Tube size: 7.0 mm Number of attempts: 1 Airway Equipment and Method: stylet Placement Confirmation: ETT inserted through vocal cords under direct vision,  positive ETCO2 and breath sounds checked- equal and bilateral Secured at: 21 cm Tube secured with: Tape Dental Injury: Teeth and Oropharynx as per pre-operative assessment

## 2012-01-26 NOTE — Anesthesia Preprocedure Evaluation (Signed)
Anesthesia Evaluation  Patient identified by MRN, date of birth, ID band Patient awake    Reviewed: Allergy & Precautions, H&P , NPO status , Patient's Chart, lab work & pertinent test results  History of Anesthesia Complications Negative for: history of anesthetic complications  Airway Mallampati: II      Dental  (+) Edentulous Upper and Edentulous Lower   Pulmonary neg pulmonary ROS,  breath sounds clear to auscultation        Cardiovascular negative cardio ROS  Rhythm:Regular     Neuro/Psych    GI/Hepatic GERD-  Medicated and Controlled,  Endo/Other    Renal/GU      Musculoskeletal   Abdominal   Peds  Hematology   Anesthesia Other Findings   Reproductive/Obstetrics                           Anesthesia Physical Anesthesia Plan  ASA: II  Anesthesia Plan: General   Post-op Pain Management:    Induction: Intravenous  Airway Management Planned: Oral ETT  Additional Equipment:   Intra-op Plan:   Post-operative Plan: Extubation in OR  Informed Consent: I have reviewed the patients History and Physical, chart, labs and discussed the procedure including the risks, benefits and alternatives for the proposed anesthesia with the patient or authorized representative who has indicated his/her understanding and acceptance.     Plan Discussed with:   Anesthesia Plan Comments:         Anesthesia Quick Evaluation

## 2012-01-26 NOTE — Transfer of Care (Signed)
Immediate Anesthesia Transfer of Care Note  Patient: Marcia Woods  Procedure(s) Performed: Procedure(s) (LRB): LAPAROSCOPIC CHOLECYSTECTOMY (N/A)  Patient Location: PACU  Anesthesia Type: General  Level of Consciousness: awake  Airway & Oxygen Therapy: Patient Spontanous Breathing and non-rebreather face mask  Post-op Assessment: Report given to PACU RN, Post -op Vital signs reviewed and stable and Patient moving all extremities  Post vital signs: Reviewed and stable  Complications: No apparent anesthesia complications

## 2012-01-26 NOTE — Interval H&P Note (Signed)
History and Physical Interval Note:  01/26/2012 8:27 AM  Marcia Woods  has presented today for surgery, with the diagnosis of Cholelithiasis  The various methods of treatment have been discussed with the patient and family. After consideration of risks, benefits and other options for treatment, the patient has consented to  Procedure(s) (LRB): LAPAROSCOPIC CHOLECYSTECTOMY (N/A) as a surgical intervention .  The patient's history has been reviewed, patient examined, no change in status, stable for surgery.  I have reviewed the patient's chart and labs.  Questions were answered to the patient's satisfaction.     [name]

## 2012-01-27 LAB — CBC
HCT: 35.8 % — ABNORMAL LOW (ref 36.0–46.0)
MCH: 30 pg (ref 26.0–34.0)
MCV: 91.8 fL (ref 78.0–100.0)
RDW: 13.4 % (ref 11.5–15.5)
WBC: 5.2 10*3/uL (ref 4.0–10.5)

## 2012-01-27 LAB — COMPREHENSIVE METABOLIC PANEL
ALT: 20 U/L (ref 0–35)
AST: 36 U/L (ref 0–37)
Albumin: 3.2 g/dL — ABNORMAL LOW (ref 3.5–5.2)
Alkaline Phosphatase: 55 U/L (ref 39–117)
Chloride: 103 mEq/L (ref 96–112)
Potassium: 3.6 mEq/L (ref 3.5–5.1)
Sodium: 138 mEq/L (ref 135–145)
Total Protein: 6.4 g/dL (ref 6.0–8.3)

## 2012-01-27 MED ORDER — ONDANSETRON HCL 4 MG PO TABS: 4.0000 mg | ORAL_TABLET | Freq: Three times a day (TID) | ORAL | Status: AC | PRN

## 2012-01-27 MED ORDER — HYDROCODONE-ACETAMINOPHEN 5-325 MG PO TABS: 1.0000 | ORAL_TABLET | Freq: Four times a day (QID) | ORAL | Status: AC | PRN

## 2012-01-27 NOTE — Anesthesia Postprocedure Evaluation (Signed)
  Anesthesia Post-op Note  Patient: Marcia Woods  Procedure(s) Performed: Procedure(s) (LRB): LAPAROSCOPIC CHOLECYSTECTOMY (N/A)  Patient Location: Room 319  Anesthesia Type: General  Level of Consciousness: awake, alert , oriented and patient cooperative  Airway and Oxygen Therapy: Patient Spontanous Breathing  Post-op Pain: mild  Post-op Assessment: Post-op Vital signs reviewed, Patient's Cardiovascular Status Stable, Respiratory Function Stable and Patent Airway  Post-op Vital Signs: Reviewed and stable  Complications: No apparent anesthesia complications

## 2012-01-27 NOTE — Discharge Summary (Signed)
Physician Discharge Summary  Patient ID: Marcia Woods MRN: [id number] DOB/AGE: 1926/05/18 78 y.o.  Admit date: 01/26/2012 Discharge date: 01/27/2012  Admission Diagnoses: Cholecystitis, cholelithiasis  Discharge Diagnoses: Same Active Problems:  * No active hospital problems. *    Discharged Condition: good  Hospital Course: Patient is an 78 year old white female who presented to day surgery for a laparoscopic cholecystectomy. After the surgery, she did have some nausea. Her nausea has resolved and she is taking by mouth fairly well. She is being discharged home on postoperative day one in good improving condition.  Treatments: surgery: Laparoscopic cholecystectomy on 01/26/2012  Discharge Exam: Blood pressure 127/67, pulse 76, temperature 98.2 F (36.8 C), temperature source Oral, resp. rate 18, height 5' 2.5" (1.588 m), weight 59.421 kg (131 lb), SpO2 98.00%. General appearance: alert, cooperative and appears stated age Eyes: No scleral icterus Resp: clear to auscultation bilaterally Cardio: regular rate and rhythm, S1, S2 normal, no murmur, click, rub or gallop GI: Soft, nontender, nondistended. Dressings dry and intact.  Disposition: 01-Home or Self Care  Discharge Orders    Future Appointments: Provider: Department: Dept Phone: Center:   02/08/2012 10:00 AM [facility] [phone] [facility]     Medication List  As of 01/27/2012  8:11 AM   TAKE these medications         ALPRAZolam 0.5 MG tablet   Commonly known as: XANAX   Take 0.5 mg by mouth at bedtime. For sleep      Biotin 1000 MCG tablet   Take 1,000 mcg by mouth daily.      HYDROcodone-acetaminophen 5-325 MG per tablet   Commonly known as: NORCO/VICODIN   Take 1 tablet by mouth every 6 (six) hours as needed for pain.      ondansetron 4 MG tablet   Commonly known as: ZOFRAN   Take 1 tablet (4 mg total) by mouth every 8 (eight) hours as needed for nausea.      pantoprazole 40 MG tablet   Commonly known as: PROTONIX   Take 40 mg by mouth every morning.      pravastatin 40 MG tablet   Commonly known as: PRAVACHOL   Take 40 mg by mouth at bedtime.      CENTRUM SILVER PO   Take 1 tablet by mouth daily.      PRESERVISION AREDS 2 PO   Take 1 capsule by mouth 2 (two) times daily.           Follow-up Information    Follow up with [name], MD. Schedule an appointment as soon as possible for a visit on 02/03/2012.   Contact information:   [address] [place] [address] [phone]          Signed: [name] 01/27/2012, 8:11 AM

## 2012-01-27 NOTE — Progress Notes (Signed)
UR chart review completed.  

## 2012-01-27 NOTE — Progress Notes (Signed)
Pt discharged home today per Dr. Lovell Sheehan. Pt's IV site D/C'd and WNL. Pt's VS stable at this time. Pt provided with home medication list, prescriptions and discharge instructions. Pt verbalized understanding. Pt's son at bedside during signing of discharge instructions. Pt left floor via WC in stable condition accompanied by NT.

## 2012-01-27 NOTE — Addendum Note (Signed)
Addendum  created 01/27/12 1610 by Marolyn Hammock, CRNA   Modules edited:Notes Section

## 2012-01-28 ENCOUNTER — Encounter (HOSPITAL_COMMUNITY): Payer: Self-pay | Admitting: General Surgery

## 2012-02-08 ENCOUNTER — Ambulatory Visit (HOSPITAL_COMMUNITY): Payer: Medicare Other

## 2012-10-30 ENCOUNTER — Encounter: Payer: Self-pay | Admitting: *Deleted

## 2012-10-31 ENCOUNTER — Encounter: Payer: Self-pay | Admitting: Adult Health

## 2012-10-31 ENCOUNTER — Ambulatory Visit (INDEPENDENT_AMBULATORY_CARE_PROVIDER_SITE_OTHER): Payer: Medicare Other | Admitting: Adult Health

## 2012-10-31 VITALS — BP 130/80 | Ht 64.0 in | Wt 136.0 lb

## 2012-10-31 DIAGNOSIS — N9489 Other specified conditions associated with female genital organs and menstrual cycle: Secondary | ICD-10-CM

## 2012-10-31 DIAGNOSIS — R319 Hematuria, unspecified: Secondary | ICD-10-CM

## 2012-10-31 DIAGNOSIS — R102 Pelvic and perineal pain: Secondary | ICD-10-CM

## 2012-10-31 LAB — POCT URINALYSIS DIPSTICK

## 2012-10-31 NOTE — Progress Notes (Signed)
Subjective:     Patient ID: Marcia Woods, female   DOB: 1925/11/19, 79 y.o.   MRN: [id number]  HPI Marcia Woods is a 79 year old white female, widowed in for pelvic pressure and some burning when she "pees"  Review of Systems Patient denies any headaches, blurred vision, shortness of breath, chest pain, abdominal pain,but has pressure, no problems with bowel movements, she has burning sometimes with urination. She said she had EGD in past and has a hiatal hernia and had normal colonoscopy in past. She had her gallbladder removed by Dr. Lovell Sheehan, and she watches what she eats. She is not having sex.    Reviewed past medical,surgical, social and family history. Reviewed medications and allergies.  Objective:   Physical Exam Blood pressure 130/80, height 5\' 4"  (1.626 m), weight 136 lb (61.689 kg).   Urine positive for WBCs and blood. Skin warm and dry.Pelvic: external genitalia is normal in appearance for her age, vagina: is atrophic, the cervix and uterus are absent, adnexa: no masses or tenderness noted, but she says it feels bloated in there and maybe a little discomfort over the bowel area. Assessment:      Hematuria Pelvic pressure    Plan:      Will send urine for UA C&S and I will call if antibiotics are needed. Return in 1 week for pelvic US and see me after [place]   Increase fluids

## 2012-10-31 NOTE — Patient Instructions (Addendum)
Follow up in 1 week for US and see me 

## 2012-11-01 LAB — URINALYSIS
Bilirubin Urine: NEGATIVE
Nitrite: NEGATIVE
Specific Gravity, Urine: 1.011 (ref 1.005–1.030)
Urobilinogen, UA: 0.2 mg/dL (ref 0.0–1.0)

## 2012-11-02 ENCOUNTER — Telehealth: Payer: Self-pay | Admitting: Obstetrics and Gynecology

## 2012-11-02 LAB — URINE CULTURE: Organism ID, Bacteria: NO GROWTH

## 2012-11-02 NOTE — Telephone Encounter (Signed)
Pt aware of no growth on urine culture

## 2012-11-07 ENCOUNTER — Encounter (INDEPENDENT_AMBULATORY_CARE_PROVIDER_SITE_OTHER): Payer: Medicare Other | Admitting: Adult Health

## 2012-11-07 ENCOUNTER — Ambulatory Visit (INDEPENDENT_AMBULATORY_CARE_PROVIDER_SITE_OTHER): Payer: Medicare Other | Admitting: Adult Health

## 2012-11-07 ENCOUNTER — Ambulatory Visit (INDEPENDENT_AMBULATORY_CARE_PROVIDER_SITE_OTHER): Payer: Medicare Other

## 2012-11-07 ENCOUNTER — Encounter: Payer: Self-pay | Admitting: Adult Health

## 2012-11-07 VITALS — BP 170/86 | Ht 64.0 in | Wt 136.0 lb

## 2012-11-07 DIAGNOSIS — N9489 Other specified conditions associated with female genital organs and menstrual cycle: Secondary | ICD-10-CM

## 2012-11-07 DIAGNOSIS — R102 Pelvic and perineal pain: Secondary | ICD-10-CM

## 2012-11-07 NOTE — Progress Notes (Signed)
Subjective:     Patient ID: Marcia Woods, female   DOB: 1926-02-26, 79 y.o.   MRN: [id number]  HPI Kimberle is back for a pelvic US for pelvic pressure and bloating. Her urine was negative and the culture was negative.  Review of Systems Positive as in HPI Reviewed past medical,surgical, social and family history. Reviewed medications and allergies.     Objective:   Physical Exam Blood pressure 170/86, height 5\' 4"  (1.626 m), weight 136 lb (61.689 kg).   Reviewed [place] with pt. Ovaries were normal, uterus is absent, she did have about 30 cc of urine in bladder after voiding.And that her bloating may be bowel related not gyn Assessment:      Pelvic pressure(bloating)    Plan:   Try gas x  Follow up with Dr. Karilyn Cota

## 2012-11-07 NOTE — Patient Instructions (Addendum)
Try gas x and follow up with Dr. Karilyn Cota Return prn

## 2012-11-08 NOTE — Progress Notes (Signed)
This encounter was created in error - please disregard.

## 2012-11-14 ENCOUNTER — Telehealth (INDEPENDENT_AMBULATORY_CARE_PROVIDER_SITE_OTHER): Payer: Self-pay | Admitting: *Deleted

## 2012-11-14 NOTE — Telephone Encounter (Signed)
Marcia Woods called the office this morning and states that she had seen Victorino Dike at Dr.Ferguson's office for bloating and feeling full.She had a ultrasound and was told that everything looked good from her navel down. She says that she is having bloating, and a full feeling . Her Bm's are normal as she has a normal Bm every morning. [place] feels that something is wrong and would like an appointment with Dr.Rehman. She had Gallbladder surgery on August 8 , 2013 by Dr.Jenkins. Patient may be reached [phone]. To be discussed with Dr.Rehman.

## 2012-11-16 ENCOUNTER — Telehealth (INDEPENDENT_AMBULATORY_CARE_PROVIDER_SITE_OTHER): Payer: Self-pay | Admitting: *Deleted

## 2012-11-16 NOTE — Telephone Encounter (Signed)
Apt has been scheduled for 11/20/12 with Dr. Karilyn Cota. - Patient called and voices understood.

## 2012-11-16 NOTE — Telephone Encounter (Signed)
Marcia Woods called the office this morning and states that she had seen Marcia Woods at Dr.Ferguson's office for bloating and feeling full.She had a ultrasound and was told that everything looked good from her navel down. She says that she is having bloating, and a full feeling . Her Bm's are normal as she has a normal Bm every morning. [name] feels that something is wrong and would like an appointment with Dr.Rehman. She had Gallbladder surgery on August 8 , 2013 by Dr.Jenkins. Patient may be reached [phone]. To be discussed with Dr.Rehman. 

## 2012-11-20 ENCOUNTER — Encounter (INDEPENDENT_AMBULATORY_CARE_PROVIDER_SITE_OTHER): Payer: Self-pay | Admitting: Internal Medicine

## 2012-11-20 ENCOUNTER — Ambulatory Visit (INDEPENDENT_AMBULATORY_CARE_PROVIDER_SITE_OTHER): Payer: Medicare Other | Admitting: Internal Medicine

## 2012-11-20 VITALS — BP 120/80 | HR 78 | Temp 97.5°F | Resp 18 | Ht 64.0 in | Wt 136.2 lb

## 2012-11-20 DIAGNOSIS — R140 Abdominal distension (gaseous): Secondary | ICD-10-CM | POA: Insufficient documentation

## 2012-11-20 DIAGNOSIS — R141 Gas pain: Secondary | ICD-10-CM

## 2012-11-20 MED ORDER — METRONIDAZOLE 250 MG PO TABS: 250.0000 mg | ORAL_TABLET | Freq: Three times a day (TID) | ORAL | Status: DC

## 2012-11-20 MED ORDER — DOCUSATE SODIUM 100 MG PO CAPS: 100.0000 mg | ORAL_CAPSULE | Freq: Two times a day (BID) | ORAL | Status: DC

## 2012-11-20 NOTE — Progress Notes (Signed)
Presenting complaint;  Abdominal bloating.  Subjective:  Patient is a 79-year-old Caucasian female who presents with several month history of abdominal bloating. She was last seen in December 2012 for nausea and abnormal CT suggesting mass in the region of cardia. EGD revealed small sliding heart hernia. She was also found to have calcified gallstone her CT and then on to have laparoscopic cholecystectomy in August 2013. She says gallbladder surgery help with pain and nausea but not with bloating. She feels bloated every day. She feels the past or at least bloated when she wakes up in the morning. Bloating gets worse as the day progresses. Bloating is not associated with nausea vomiting abdominal pain. Her bowels generally move daily. She uses Colace every now and then. Her appetite is good and she has not lost any weight. She was seen by Ms. Cyril Mourning NP recently and had pelvic ultrasound which was within normal limits. She also denies difficulty voiding. Patient's last colonoscopy was in November 2012 and was within normal limits.        Current Medications: Current Outpatient Prescriptions  Medication Sig Dispense Refill  . ALPRAZolam (XANAX) 0.5 MG tablet Take 0.5 mg by mouth at bedtime. For sleep      . Biotin 1000 MCG tablet Take 1,000 mcg by mouth daily.       . cholecalciferol (VITAMIN D) 1000 UNITS tablet Take 1,000 Units by mouth daily.      . Multiple Vitamins-Minerals (CENTRUM SILVER PO) Take 1 tablet by mouth daily.        . Multiple Vitamins-Minerals (PRESERVISION AREDS 2 PO) Take 1 capsule by mouth 2 (two) times daily.       . pantoprazole (PROTONIX) 40 MG tablet Take 40 mg by mouth every morning.       . pravastatin (PRAVACHOL) 40 MG tablet Take 40 mg by mouth at bedtime.        No current facility-administered medications for this visit.     Objective: Blood pressure 120/80, pulse 78, temperature 97.5 F (36.4 C), temperature source Oral, resp. rate 18, height  5\' 4"  (1.626 m), weight 136 lb 3.2 oz (61.78 kg). Patient is alert and in no acute distress. Conjunctiva is pink. Sclera is nonicteric Oropharyngeal mucosa is normal. No neck masses or thyromegaly noted. Cardiac exam with regular rhythm normal S1 and S2. No murmur or gallop noted. Lungs are clear to auscultation. Abdomen is symmetrical. Bowel sounds are normal. No bruits noted. Palpation reveals soft abdomen without tenderness again a megaly or masses. Percussion note is normal. No LE edema or clubbing noted.  Labs/studies Results: Transvaginal non-OB ultrasound on 11/07/2012. Uterus surgically absent. Both ovaries within normal limits without cysts or masses.    Assessment:  #1. Abdominal bloating. Patient does not have a lot of symptoms but symptoms appears to be affecting quality of her life. Recent pelvic ultrasound was negative for ascites or ovarian cysts. She could have small bowel bacterial overgrowth although she does not have typical symptoms. #2. GERD. Heartburn is well controlled with therapy. .   Plan:  Colace 100 mg by mouth twice a day. Metronidazole 250 mg by mouth 3 times a day for 10 days. Patient will keep symptom diary and call us with progress report when she finishes antibiotic. Office visit in 2 months.

## 2012-11-20 NOTE — Patient Instructions (Signed)
Keep symptom diary twice daily. Call office with progress report when you finish metronidazole

## 2012-11-29 ENCOUNTER — Telehealth (INDEPENDENT_AMBULATORY_CARE_PROVIDER_SITE_OTHER): Payer: Self-pay | Admitting: *Deleted

## 2012-11-29 NOTE — Telephone Encounter (Signed)
Patient called office this afternoon and states that she had a BM and saw blood. Last seen by Dr.Rehman on June 2 nd for abdominal bloating , she was given Flagyl , refer to OV Note. She complains of still having the bloating feeling in her abd. Patient had her Gall Bladder removed last August 7 , 2014 Patient is fearful of her condition. An appointment was given for tomorrow 11/30/12 with Delrae Rend @ 10:30 am. Dr.Rehman was made aware.

## 2012-11-30 ENCOUNTER — Ambulatory Visit (INDEPENDENT_AMBULATORY_CARE_PROVIDER_SITE_OTHER): Payer: Medicare Other | Admitting: Internal Medicine

## 2012-11-30 ENCOUNTER — Encounter (INDEPENDENT_AMBULATORY_CARE_PROVIDER_SITE_OTHER): Payer: Self-pay | Admitting: Internal Medicine

## 2012-11-30 VITALS — BP 160/78 | HR 80 | Temp 98.0°F | Ht 64.0 in | Wt 134.2 lb

## 2012-11-30 DIAGNOSIS — K625 Hemorrhage of anus and rectum: Secondary | ICD-10-CM

## 2012-11-30 LAB — CBC
Platelets: 269 10*3/uL (ref 150–400)
RBC: 4.74 MIL/uL (ref 3.87–5.11)
RDW: 13.7 % (ref 11.5–15.5)
WBC: 5.3 10*3/uL (ref 4.0–10.5)

## 2012-11-30 MED ORDER — HYDROCORTISONE ACETATE 25 MG RE SUPP: 25.0000 mg | Freq: Two times a day (BID) | RECTAL | Status: DC

## 2012-11-30 NOTE — Progress Notes (Signed)
Subjective:     Patient ID: Marcia Woods, female   DOB: 05-17-1926, 79 y.o.   MRN: [id number]  HPI Presents today she tells me when she had a BM yesterday she saw blood in the commode and also this morning. She describes the blood as bright red. No pain associated with the rectal bleeding. No prior hx of rectal bleeding. She usually has a BM about 1-2 times a day. She finished the Flagyl 250mg  yesterday. She was prescribed this for bloating possible bacterial overgrowth.   Last visit in around the first of June and she was placed of Flagyl250mg  TID x 10 days for possible bacterial overgrowth.  02/05/2012 Cholecystectomy: for cholecystitis, cholelithiasis    04/30/2011 Colonoscopy:  Normal colonoscopy  05/2011 EGD: Impression:  Small sliding hiatal hernia otherwise normal esophagogastroduodenoscopy.  Recent CT revealed calcified gallstone her symptoms are not typical of biliary tract disease.   10/2012 Pelvis US: transvaginal: normal.    Review of Systems see hpi Current Outpatient Prescriptions  Medication Sig Dispense Refill  . ALPRAZolam (XANAX) 0.5 MG tablet Take 0.5 mg by mouth at bedtime. For sleep      . Biotin 1000 MCG tablet Take 1,000 mcg by mouth daily.       . cholecalciferol (VITAMIN D) 1000 UNITS tablet Take 1,000 Units by mouth daily.      Marland Kitchen docusate sodium (COLACE) 100 MG capsule Take 1 capsule (100 mg total) by mouth 2 (two) times daily.  10 capsule  0  . Multiple Vitamins-Minerals (CENTRUM SILVER PO) Take 1 tablet by mouth daily.        . Multiple Vitamins-Minerals (PRESERVISION AREDS 2 PO) Take 1 capsule by mouth 2 (two) times daily.       . pantoprazole (PROTONIX) 40 MG tablet Take 40 mg by mouth every morning.       . pravastatin (PRAVACHOL) 40 MG tablet Take 40 mg by mouth at bedtime.       . hydrocortisone (ANUSOL-HC) 25 MG suppository Place 1 suppository (25 mg total) rectally every 12 (twelve) hours.  20 suppository  0   No current facility-administered  medications for this visit.   Past Medical History  Diagnosis Date  . Hyperlipemia   . Acid reflux   . Macular degeneration   . Constipation   . Anxiety   . Arthritis   . Vaginal atrophy    Past Surgical History  Procedure Laterality Date  . Knee arthroscopy  2010    left-[facility]  . Trigger finger release      right  . Hemorrhoid surgery    . Cataract surgery      bilaterally  . Abdominal hysterectomy    . Colonoscopy  04/30/2011    Procedure: COLONOSCOPY;  Surgeon: Malissa Hippo, MD;  Location: [facility];  Service: Endoscopy;  Laterality: N/A;  8:30 am  . Esophagogastroduodenoscopy  05/27/2011    Procedure: ESOPHAGOGASTRODUODENOSCOPY (EGD);  Surgeon: Malissa Hippo, MD;  Location: [facility];  Service: Endoscopy;  Laterality: N/A;  300  . Cervical disc surgery    . Cholecystectomy  01/26/2012    Procedure: LAPAROSCOPIC CHOLECYSTECTOMY;  Surgeon: [name], MD;  Location: [facility];  Service: General;  Laterality: N/A;   No Known Allergies      Objective:   Physical Exam  Filed Vitals:   11/30/12 1030  BP: 160/78  Pulse: 80  Temp: 98 F (36.7 C)  Height: 5\' 4"  (1.626 m)  Weight: 134 lb 3.2 oz (  60.873 kg)   Alert and oriented. Skin warm and dry. Oral mucosa is moist.   . Sclera anicteric, conjunctivae is pink. Thyroid not enlarged. No cervical lymphadenopathy. Lungs clear. Heart regular rate and rhythm.  Abdomen is soft. Bowel sounds are positive. No hepatomegaly. No abdominal masses felt. No tenderness.  No edema to lower extremities. Stool brown and guaiac negative.     Assessment:    Rectal bleeding. She was guaiac negative in office today. Suspect may have been hemorrhoidal. Bloating is much better since being treated with Flagyl.    Plan:    CBC today. Anusol supp x 10 days BID. OV in one month.

## 2012-11-30 NOTE — Patient Instructions (Addendum)
Anusol Supp x 10 days. OV in 1 month. CBC

## 2012-12-28 ENCOUNTER — Encounter (INDEPENDENT_AMBULATORY_CARE_PROVIDER_SITE_OTHER): Payer: Self-pay | Admitting: Internal Medicine

## 2012-12-28 ENCOUNTER — Ambulatory Visit (INDEPENDENT_AMBULATORY_CARE_PROVIDER_SITE_OTHER): Payer: Medicare Other | Admitting: Internal Medicine

## 2012-12-28 VITALS — BP 106/60 | HR 56 | Temp 97.7°F | Ht 60.0 in | Wt 136.4 lb

## 2012-12-28 DIAGNOSIS — R140 Abdominal distension (gaseous): Secondary | ICD-10-CM

## 2012-12-28 DIAGNOSIS — R142 Eructation: Secondary | ICD-10-CM

## 2012-12-28 NOTE — Patient Instructions (Addendum)
OV in 1 yr. 

## 2012-12-28 NOTE — Progress Notes (Signed)
Subjective:     Patient ID: Marcia Woods, female   DOB: 08-10-1925, 80 y.o.   MRN: [id number]  HPI Here today for f/u of her bloating. She says she is some better. She usually has a BM1-2 a day about every day. No rectal bleeding since her last visit. She was last treated for bacterial overgrowth with Flagyl  250mg  TID x 10 days. She still has some bloating does pass some gas.  Appetite is good. No weight loss. Sometimes she thinks it is too good.  She exercises by getting out and working her yard and walking    04/2011 Colonoscopy: Normal.   05/2011 EGD: Impression:  Small sliding hiatal hernia otherwise normal esophagogastroduodenoscopy.  Recent CT revealed calcified gallstone her symptoms are not typical of biliary tract disease.   01/2012 Cholecystectomy Dr. Lovell Sheehan   Review of Systems     Current Outpatient Prescriptions  Medication Sig Dispense Refill  . ALPRAZolam (XANAX) 0.5 MG tablet Take 0.5 mg by mouth at bedtime. For sleep      . Biotin 1000 MCG tablet Take 1,000 mcg by mouth daily.       . cholecalciferol (VITAMIN D) 1000 UNITS tablet Take 1,000 Units by mouth daily.      Marland Kitchen docusate sodium (COLACE) 100 MG capsule Take 1 capsule (100 mg total) by mouth 2 (two) times daily.  10 capsule  0  . hydrocortisone (ANUSOL-HC) 25 MG suppository Place 1 suppository (25 mg total) rectally every 12 (twelve) hours.  20 suppository  0  . Multiple Vitamins-Minerals (CENTRUM SILVER PO) Take 1 tablet by mouth daily.        . Multiple Vitamins-Minerals (PRESERVISION AREDS 2 PO) Take 1 capsule by mouth 2 (two) times daily.       . pantoprazole (PROTONIX) 40 MG tablet Take 40 mg by mouth every morning.       . pravastatin (PRAVACHOL) 40 MG tablet Take 40 mg by mouth at bedtime.        No current facility-administered medications for this visit.   Past Medical History  Diagnosis Date  . Hyperlipemia   . Acid reflux   . Macular degeneration   . Constipation   . Anxiety   . Arthritis    . Vaginal atrophy   . Past Surgical History  Procedure Laterality Date  . Knee arthroscopy  2010    left-[facility]  . Trigger finger release      right  . Hemorrhoid surgery    . Cataract surgery      bilaterally  . Abdominal hysterectomy    . Colonoscopy  04/30/2011    Procedure: COLONOSCOPY;  Surgeon: Malissa Hippo, MD;  Location: [facility];  Service: Endoscopy;  Laterality: N/A;  8:30 am  . Esophagogastroduodenoscopy  05/27/2011    Procedure: ESOPHAGOGASTRODUODENOSCOPY (EGD);  Surgeon: Malissa Hippo, MD;  Location: [facility];  Service: Endoscopy;  Laterality: N/A;  300  . Cervical disc surgery    . Cholecystectomy  01/26/2012    Procedure: LAPAROSCOPIC CHOLECYSTECTOMY;  Surgeon: [name], MD;  Location: [facility];  Service: General;  Laterality: N/A;   No Known Allergies .  Objective:   Physical Exam  Filed Vitals:   12/28/12 1052  BP: 106/60  Pulse: 56  Temp: 97.7 F (36.5 C)  Height: 5' (1.524 m)  Weight: 136 lb 6.4 oz (61.871 kg)   Alert and oriented. Skin warm and dry. Oral mucosa is moist.   . Sclera anicteric, conjunctivae  is pink. Thyroid not enlarged. No cervical lymphadenopathy. Lungs clear. Heart regular rate and rhythm.  Abdomen is soft. Bowel sounds are positive. No hepatomegaly. No abdominal masses felt. No tenderness.  No edema to lower extremities.   Stool brown and guaiac negative.    Assessment:    Bloating which is much better at this time.    Plan:    Continue to exercise. OV in 1 yr. If any problems call our office

## 2013-02-12 ENCOUNTER — Ambulatory Visit (INDEPENDENT_AMBULATORY_CARE_PROVIDER_SITE_OTHER): Payer: Medicare Other | Admitting: Internal Medicine

## 2013-02-12 ENCOUNTER — Encounter (INDEPENDENT_AMBULATORY_CARE_PROVIDER_SITE_OTHER): Payer: Self-pay | Admitting: Internal Medicine

## 2013-02-12 VITALS — BP 130/90 | HR 68 | Temp 97.8°F | Resp 18 | Ht 60.0 in | Wt 139.2 lb

## 2013-02-12 DIAGNOSIS — R141 Gas pain: Secondary | ICD-10-CM

## 2013-02-12 DIAGNOSIS — R1033 Periumbilical pain: Secondary | ICD-10-CM

## 2013-02-12 DIAGNOSIS — R140 Abdominal distension (gaseous): Secondary | ICD-10-CM

## 2013-02-12 NOTE — Progress Notes (Signed)
Presenting complaint;  Persistent abdominal bloating and mid abdominal pain.  Subjective:  Patient is 79 year old Caucasian female who presents for reevaluation of mid abdominal pain and bloating. I saw her last on 11-2012 and treated her with metronidazole for 10 days but she reported no improvement. Last month she was seen by Ms.Setzer, NP for a hematochezia felt to be secondary to hemorrhoids. She does not feel any better. Her bowels generally move daily. This does not alleviate her symptoms. She states her bloating started after a cholecystectomy one year ago to she also complains of mid abdominal pain which is intermittent and primarily centered around the umbilicus. As far as the bloating is concerned she feels best in the morning and worse in the evening. Her bowels move daily. She had single episode of nausea with heaving but no vomiting. She does get transient relief of her bloating when she is able to burp. Her appetite is normal and her weight has been stable.    Current Medications: Current Outpatient Prescriptions  Medication Sig Dispense Refill  . ALPRAZolam (XANAX) 0.5 MG tablet Take 0.5 mg by mouth at bedtime. For sleep      . CELEBREX 200 MG capsule Take 200 mg by mouth. Patient states that she takes 1 by mouth once a week ,or every 2 weeks ,as needed.      . cholecalciferol (VITAMIN D) 1000 UNITS tablet Take 1,000 Units by mouth daily.      Marland Kitchen docusate sodium (COLACE) 100 MG capsule Take 1 capsule (100 mg total) by mouth 2 (two) times daily.  10 capsule  0  . Multiple Vitamins-Minerals (CENTRUM SILVER PO) Take 1 tablet by mouth daily.        . Multiple Vitamins-Minerals (PRESERVISION AREDS 2 PO) Take 1 capsule by mouth 2 (two) times daily.       . pantoprazole (PROTONIX) 40 MG tablet Take 40 mg by mouth every morning.       . pravastatin (PRAVACHOL) 40 MG tablet Take 40 mg by mouth at bedtime.        No current facility-administered medications for this visit.      Objective: Blood pressure 130/90, pulse 68, temperature 97.8 F (36.6 C), temperature source Oral, resp. rate 18, height 5' (1.524 m), weight 139 lb 3.2 oz (63.141 kg). Patient is alert and in no acute distress. Conjunctiva is pink. Sclera is nonicteric Oropharyngeal mucosa is normal. No neck masses or thyromegaly noted. Abdomen symmetrical. Bowel sounds are normal. No bruits noted. Abdomen is soft and mild tenderness in midabdomen as well as across upper abdomen. No organomegaly or masses noted to No LE edema or clubbing noted.   Assessment:  #1. Abdominal bloating and midabdominal pain. Patient did not see any benefit with 10 day course of metronidazole water with improved defecation. I doubt that sporadic use of Celebrex has anything to do with her symptoms. #2. GERD. Heartburn is well controlled with therapy.    Plan:  Abdominopelvic CT with contrast. If CT is unremarkable will consider stopping PPI for a few weeks and see if it helps.

## 2013-02-12 NOTE — Patient Instructions (Signed)
Physician will contact you with results of CT when completed 

## 2013-02-21 ENCOUNTER — Encounter (INDEPENDENT_AMBULATORY_CARE_PROVIDER_SITE_OTHER): Payer: Self-pay | Admitting: *Deleted

## 2013-02-28 ENCOUNTER — Ambulatory Visit (HOSPITAL_COMMUNITY)
Admission: RE | Admit: 2013-02-28 | Discharge: 2013-02-28 | Disposition: A | Discharge status: Home / Self Care | Payer: Medicare Other | Source: Ambulatory Visit | Attending: Internal Medicine | Admitting: Internal Medicine

## 2013-02-28 DIAGNOSIS — K449 Diaphragmatic hernia without obstruction or gangrene: Secondary | ICD-10-CM | POA: Insufficient documentation

## 2013-02-28 DIAGNOSIS — K5730 Diverticulosis of large intestine without perforation or abscess without bleeding: Secondary | ICD-10-CM | POA: Insufficient documentation

## 2013-02-28 DIAGNOSIS — R109 Unspecified abdominal pain: Secondary | ICD-10-CM | POA: Insufficient documentation

## 2013-02-28 MED ORDER — IOHEXOL 300 MG/ML  SOLN
100.0000 mL | Freq: Once | INTRAMUSCULAR | Status: AC | PRN
  Administered 2013-02-28: 100 mL via INTRAVENOUS

## 2013-03-02 ENCOUNTER — Ambulatory Visit (HOSPITAL_COMMUNITY)
Admission: RE | Admit: 2013-03-02 | Discharge: 2013-03-02 | Disposition: A | Discharge status: Home / Self Care | Payer: Medicare Other | Source: Ambulatory Visit | Attending: Internal Medicine | Admitting: Internal Medicine

## 2013-03-02 ENCOUNTER — Emergency Department (HOSPITAL_COMMUNITY): Payer: Medicare Other

## 2013-03-02 ENCOUNTER — Other Ambulatory Visit: Payer: Self-pay

## 2013-03-02 ENCOUNTER — Emergency Department (HOSPITAL_COMMUNITY)
Admission: EM | Admit: 2013-03-02 | Discharge: 2013-03-02 | Disposition: A | Discharge status: Home / Self Care | Payer: Medicare Other | Source: Home / Self Care | Attending: Emergency Medicine | Admitting: Emergency Medicine

## 2013-03-02 ENCOUNTER — Encounter (HOSPITAL_COMMUNITY): Payer: Self-pay

## 2013-03-02 DIAGNOSIS — I498 Other specified cardiac arrhythmias: Secondary | ICD-10-CM | POA: Insufficient documentation

## 2013-03-02 DIAGNOSIS — K5900 Constipation, unspecified: Secondary | ICD-10-CM | POA: Insufficient documentation

## 2013-03-02 DIAGNOSIS — I517 Cardiomegaly: Secondary | ICD-10-CM

## 2013-03-02 DIAGNOSIS — F411 Generalized anxiety disorder: Secondary | ICD-10-CM | POA: Insufficient documentation

## 2013-03-02 DIAGNOSIS — Z79899 Other long term (current) drug therapy: Secondary | ICD-10-CM | POA: Insufficient documentation

## 2013-03-02 DIAGNOSIS — Z8669 Personal history of other diseases of the nervous system and sense organs: Secondary | ICD-10-CM | POA: Insufficient documentation

## 2013-03-02 DIAGNOSIS — R002 Palpitations: Secondary | ICD-10-CM | POA: Insufficient documentation

## 2013-03-02 DIAGNOSIS — R000 Tachycardia, unspecified: Secondary | ICD-10-CM

## 2013-03-02 DIAGNOSIS — K219 Gastro-esophageal reflux disease without esophagitis: Secondary | ICD-10-CM | POA: Insufficient documentation

## 2013-03-02 DIAGNOSIS — Z8742 Personal history of other diseases of the female genital tract: Secondary | ICD-10-CM | POA: Insufficient documentation

## 2013-03-02 DIAGNOSIS — E785 Hyperlipidemia, unspecified: Secondary | ICD-10-CM | POA: Insufficient documentation

## 2013-03-02 DIAGNOSIS — M129 Arthropathy, unspecified: Secondary | ICD-10-CM | POA: Insufficient documentation

## 2013-03-02 LAB — CBC WITH DIFFERENTIAL/PLATELET
Basophils Relative: 0 % (ref 0–1)
Eosinophils Absolute: 0 10*3/uL (ref 0.0–0.7)
Lymphs Abs: 1.6 10*3/uL (ref 0.7–4.0)
MCH: 30.4 pg (ref 26.0–34.0)
MCHC: 33.1 g/dL (ref 30.0–36.0)
Neutrophils Relative %: 56 % (ref 43–77)
Platelets: 219 10*3/uL (ref 150–400)
RBC: 4.83 MIL/uL (ref 3.87–5.11)

## 2013-03-02 LAB — HEPATIC FUNCTION PANEL
Albumin: 4.1 g/dL (ref 3.5–5.2)
Total Protein: 7.9 g/dL (ref 6.0–8.3)

## 2013-03-02 LAB — TROPONIN I: Troponin I: <0.3 ng/mL (ref <0.30)

## 2013-03-02 LAB — BASIC METABOLIC PANEL
GFR calc Af Amer: 89 mL/min — ABNORMAL LOW (ref >90)
GFR calc non Af Amer: 77 mL/min — ABNORMAL LOW (ref >90)
Potassium: 4.1 mEq/L (ref 3.5–5.1)
Sodium: 139 mEq/L (ref 135–145)

## 2013-03-02 MED ORDER — METOPROLOL TARTRATE 1 MG/ML IV SOLN
5.0000 mg | Freq: Once | INTRAVENOUS | Status: AC
  Administered 2013-03-02: 5 mg via INTRAVENOUS
  Filled 2013-03-02: qty 5

## 2013-03-02 NOTE — ED Notes (Signed)
Pt took po cardizem at home today around 0800. Reports just started this medication yesterday.

## 2013-03-02 NOTE — ED Notes (Signed)
Left in c/o family for transport home; a&ox4; NSR on monitor rate 62. Denies pain.  Instructions and f/u information given/reviewed - verbalizes understanding.

## 2013-03-02 NOTE — ED Provider Notes (Signed)
CSN: [id number]     Arrival date & time 03/02/13  1137 History  This chart was scribed for Benny Lennert, MD by Quintella Reichert, ED scribe.  This patient was seen in room [facility] and the patient's care was started at 11:47 AM.  Chief Complaint  Patient presents with  . Tachycardia    Patient is a 79 y.o. female presenting with palpitations. The history is provided by the patient and a relative. No language interpreter was used.  Palpitations Palpitations quality:  Fast Duration:  3 days Timing:  Intermittent Progression:  Worsening Chronicity:  New Relieved by:  Nothing Worsened by:  Nothing tried Ineffective treatments: ditiazem. Associated symptoms: no back pain, no chest pain, no chest pressure, no cough, no diaphoresis, no dizziness, no lower extremity edema, no shortness of breath, no syncope and no vomiting   Risk factors: no heart disease and no hx of atrial fibrillation     HPI Comments: SHERRILL BUIKEMA is a 79 y.o. female with h/o hyperlipemia who presents to the Emergency Department complaining of gradually-worsening tachycardia that began 3 days ago.  Pt reports that 3 days ago she initially noticed her heart rate alternating between normal and rapid.  This pattern persisted for the following 2 days.  Daughter reports that yesterday pt saw her PCP who performed an EKG and found that her heart rate was ranging from 76-196.  He sent her home with ditiazem 180 mg and advised that she come to the hospital but she states that she wanted to see if the ditiazem would relieve her symptoms first.  It has not provided any relief and today her tachycardia has been more constant.  She denies CP, SOB, dizziness, or diaphoresis.    PCP is Dr. Ouida Sills   Past Medical History  Diagnosis Date  . Hyperlipemia   . Acid reflux   . Macular degeneration   . Constipation   . Anxiety   . Arthritis   . Vaginal atrophy     Past Surgical History  Procedure Laterality Date  . Knee  arthroscopy  2010    left-[facility]  . Trigger finger release      right  . Hemorrhoid surgery    . Cataract surgery      bilaterally  . Abdominal hysterectomy    . Colonoscopy  04/30/2011    Procedure: COLONOSCOPY;  Surgeon: Malissa Hippo, MD;  Location: [facility];  Service: Endoscopy;  Laterality: N/A;  8:30 am  . Esophagogastroduodenoscopy  05/27/2011    Procedure: ESOPHAGOGASTRODUODENOSCOPY (EGD);  Surgeon: Malissa Hippo, MD;  Location: [facility];  Service: Endoscopy;  Laterality: N/A;  300  . Cervical disc surgery    . Cholecystectomy  01/26/2012    Procedure: LAPAROSCOPIC CHOLECYSTECTOMY;  Surgeon: [name], MD;  Location: [facility];  Service: General;  Laterality: N/A;    Family History  Problem Relation Age of Onset  . Colon cancer Neg Hx   . Heart disease Mother   . Diabetes Mother   . Heart disease Father   . Diabetes Father     History  Substance Use Topics  . Smoking status: Never Smoker   . Smokeless tobacco: Never Used  . Alcohol Use: No    OB History   Grav Para Term Preterm Abortions TAB SAB Ect Mult Living   2 2              Review of Systems  Constitutional: Negative for diaphoresis, appetite change and  fatigue.  HENT: Negative for congestion, sinus pressure and ear discharge.   Eyes: Negative for discharge.  Respiratory: Negative for cough and shortness of breath.   Cardiovascular: Positive for palpitations. Negative for chest pain and syncope.  Gastrointestinal: Negative for vomiting, abdominal pain and diarrhea.  Genitourinary: Negative for frequency and hematuria.  Musculoskeletal: Negative for back pain.  Skin: Negative for rash.  Neurological: Negative for dizziness, seizures and headaches.  Psychiatric/Behavioral: Negative for hallucinations.     Allergies  Review of patient's allergies indicates no known allergies.  Home Medications   Current Outpatient Rx  Name  Route  Sig  Dispense  Refill  . ALPRAZolam (XANAX) 0.5  MG tablet   Oral   Take 0.5 mg by mouth at bedtime. For sleep         . CELEBREX 200 MG capsule   Oral   Take 200 mg by mouth. Patient states that she takes 1 by mouth once a week ,or every 2 weeks ,as needed.         . cholecalciferol (VITAMIN D) 1000 UNITS tablet   Oral   Take 1,000 Units by mouth daily.         Marland Kitchen docusate sodium (COLACE) 100 MG capsule   Oral   Take 1 capsule (100 mg total) by mouth 2 (two) times daily.   10 capsule   0   . Multiple Vitamins-Minerals (CENTRUM SILVER PO)   Oral   Take 1 tablet by mouth daily.           . Multiple Vitamins-Minerals (PRESERVISION AREDS 2 PO)   Oral   Take 1 capsule by mouth 2 (two) times daily.          . pantoprazole (PROTONIX) 40 MG tablet   Oral   Take 40 mg by mouth every morning.          . pravastatin (PRAVACHOL) 40 MG tablet   Oral   Take 40 mg by mouth at bedtime.           BP 152/84  Pulse 140  Resp 20  Ht 5\' 5"  (1.651 m)  Wt 139 lb (63.05 kg)  BMI 23.13 kg/m2  SpO2 100%  Physical Exam  Constitutional: She is oriented to person, place, and time. She appears well-developed.  HENT:  Head: Normocephalic.  Eyes: Conjunctivae and EOM are normal. No scleral icterus.  Neck: Neck supple. No thyromegaly present.  Cardiovascular: Regular rhythm.  Tachycardia present.  Exam reveals no gallop and no friction rub.   No murmur heard. Pulmonary/Chest: No stridor. She has no wheezes. She has no rales. She exhibits no tenderness.  Abdominal: She exhibits no distension. There is no tenderness. There is no rebound.  Musculoskeletal: Normal range of motion. She exhibits no edema.  Lymphadenopathy:    She has no cervical adenopathy.  Neurological: She is oriented to person, place, and time. Coordination normal.  Skin: No rash noted. No erythema.  Psychiatric: She has a normal mood and affect. Her behavior is normal.    ED Course  Procedures (including critical care time)  DIAGNOSTIC STUDIES: Oxygen  Saturation is 100% on room air, normal by my interpretation.    COORDINATION OF CARE: 11:52 AM: Discussed treatment plan which includes Lopressor injection, CXR, EKG and labs.  Pt expressed understanding and agreed to plan.   Labs Review Labs Reviewed - No data to display  Imaging Review Ct Abdomen Pelvis W Contrast  02/28/2013   *RADIOLOGY REPORT*  Clinical Data: Abdominal  pain and bloating  CT ABDOMEN AND PELVIS WITH CONTRAST  Technique:  Multidetector CT imaging of the abdomen and pelvis was performed following the standard protocol during bolus administration of intravenous contrast.  Contrast: OMNIPAQUE IOHEXOL 300 MG/ML  SOLN  Comparison: CT, 05/19/2011  Findings: The lung bases demonstrate coarse reticular opacities consistent with scarring, stable from prior exam.  The heart is normal in size. Small hiatal hernia.  There are several liver calcifications.  These are consistent with healed granuloma.  The liver is otherwise unremarkable.  Normal spleen and pancreas.  The gallbladder is surgically absent.  There is no significant bile duct dilation.  No adrenal masses.  Normal kidneys, ureters and bladder.  The uterus is surgically absent.  There are no pelvic masses.  No adenopathy.  There are no abnormal fluid collections.  Scattered colonic diverticula are noted mostly along the sigmoid colon.  No diverticulitis.  The colon is otherwise unremarkable. Normal small bowel.  Normal appendix.  There is a mild compression fracture of L1.  This is stable. Degenerative changes are noted most evident at L4-L5.  No osteoblastic or osteolytic lesions.  IMPRESSION: No acute findings.  Small hiatal hernia.  Other chronic findings as detailed, stable from the prior study.   Original Report Authenticated By: Amie Portland, M.D.    Date: 03/02/2013  Rate  136  Rhythm: sinus tachycardia  QRS Axis: normal  Intervals: normal  ST/T Wave abnormalities: normal  Conduction Disutrbances:none  Narrative  Interpretation:   Old EKG Reviewed: none available   Date: 03/02/2013  #2  Rate62  Rhythm: normal sinus rhythm  QRS Axis: normal  Intervals: normal  ST/T Wave abnormalities: normal  Conduction Disutrbances:none  Narrative Interpretation:   Old EKG Reviewed: changes noted   MDM  Sinus tach now controled.  I spoke with her md Dr. Ouida Sills and he will see her in his office next week. We will continue cardiazem   The chart was scribed for me under my direct supervision.  I personally performed the history, physical, and medical decision making and all procedures in the evaluation of this patient.Benny Lennert, MD 03/02/13 1410

## 2013-03-02 NOTE — Progress Notes (Signed)
*  PRELIMINARY RESULTS* Echocardiogram 2D Echocardiogram has been performed.  Mylan Lengyel 03/02/2013, 12:25 PM

## 2013-03-02 NOTE — ED Notes (Signed)
Pt reports has had increased HR since Tuesday.  PT says feels heart slow down for a few minutes then starts beating fast again.  Denies any c/p, weakness, SOB, or dizziness.

## 2013-03-03 ENCOUNTER — Encounter (HOSPITAL_COMMUNITY): Payer: Self-pay | Admitting: *Deleted

## 2013-03-03 ENCOUNTER — Emergency Department (HOSPITAL_COMMUNITY): Payer: Medicare Other

## 2013-03-03 ENCOUNTER — Inpatient Hospital Stay (HOSPITAL_COMMUNITY)
Admission: EM | Admit: 2013-03-03 | Discharge: 2013-03-06 | DRG: 310 | Disposition: A | Discharge status: Home / Self Care | Payer: Medicare Other | Attending: Internal Medicine | Admitting: Internal Medicine

## 2013-03-03 DIAGNOSIS — E785 Hyperlipidemia, unspecified: Secondary | ICD-10-CM

## 2013-03-03 DIAGNOSIS — Z79899 Other long term (current) drug therapy: Secondary | ICD-10-CM

## 2013-03-03 DIAGNOSIS — I471 Supraventricular tachycardia, unspecified: Secondary | ICD-10-CM

## 2013-03-03 DIAGNOSIS — E7800 Pure hypercholesterolemia, unspecified: Secondary | ICD-10-CM | POA: Diagnosis present

## 2013-03-03 DIAGNOSIS — R000 Tachycardia, unspecified: Secondary | ICD-10-CM | POA: Diagnosis present

## 2013-03-03 DIAGNOSIS — I498 Other specified cardiac arrhythmias: Secondary | ICD-10-CM | POA: Diagnosis present

## 2013-03-03 DIAGNOSIS — E782 Mixed hyperlipidemia: Secondary | ICD-10-CM | POA: Diagnosis present

## 2013-03-03 DIAGNOSIS — F411 Generalized anxiety disorder: Secondary | ICD-10-CM | POA: Diagnosis present

## 2013-03-03 DIAGNOSIS — I4892 Unspecified atrial flutter: Principal | ICD-10-CM | POA: Diagnosis present

## 2013-03-03 DIAGNOSIS — R002 Palpitations: Secondary | ICD-10-CM | POA: Diagnosis present

## 2013-03-03 DIAGNOSIS — H3530 Unspecified macular degeneration: Secondary | ICD-10-CM | POA: Diagnosis present

## 2013-03-03 DIAGNOSIS — I4891 Unspecified atrial fibrillation: Secondary | ICD-10-CM

## 2013-03-03 DIAGNOSIS — K5900 Constipation, unspecified: Secondary | ICD-10-CM | POA: Diagnosis present

## 2013-03-03 DIAGNOSIS — K219 Gastro-esophageal reflux disease without esophagitis: Secondary | ICD-10-CM | POA: Diagnosis present

## 2013-03-03 DIAGNOSIS — M129 Arthropathy, unspecified: Secondary | ICD-10-CM | POA: Diagnosis present

## 2013-03-03 LAB — COMPREHENSIVE METABOLIC PANEL
AST: 31 U/L (ref 0–37)
Alkaline Phosphatase: 68 U/L (ref 39–117)
BUN: 9 mg/dL (ref 6–23)
CO2: 27 mEq/L (ref 19–32)
Chloride: 101 mEq/L (ref 96–112)
Creatinine, Ser: 0.83 mg/dL (ref 0.50–1.10)
GFR calc non Af Amer: 63 mL/min — ABNORMAL LOW (ref >90)
Total Bilirubin: 0.4 mg/dL (ref 0.3–1.2)

## 2013-03-03 LAB — GLUCOSE, CAPILLARY: Glucose-Capillary: 96 mg/dL (ref 70–99)

## 2013-03-03 LAB — TROPONIN I
Troponin I: <0.3 ng/mL (ref <0.30)
Troponin I: <0.3 ng/mL (ref <0.30)

## 2013-03-03 LAB — CBC
HCT: 44.5 % (ref 36.0–46.0)
MCV: 91.8 fL (ref 78.0–100.0)
RBC: 4.85 MIL/uL (ref 3.87–5.11)
WBC: 4.6 10*3/uL (ref 4.0–10.5)

## 2013-03-03 LAB — PROTIME-INR
INR: 0.95 (ref 0.00–1.49)
Prothrombin Time: 12.5 seconds (ref 11.6–15.2)

## 2013-03-03 LAB — MRSA PCR SCREENING: MRSA by PCR: NEGATIVE

## 2013-03-03 MED ORDER — SIMVASTATIN 20 MG PO TABS
20.0000 mg | ORAL_TABLET | Freq: Every day | ORAL | Status: DC
  Administered 2013-03-03: 20 mg via ORAL
  Filled 2013-03-03: qty 1

## 2013-03-03 MED ORDER — ACETAMINOPHEN 325 MG PO TABS
650.0000 mg | ORAL_TABLET | Freq: Four times a day (QID) | ORAL | Status: DC | PRN
Start: 2013-03-03 — End: 2013-03-06

## 2013-03-03 MED ORDER — ONDANSETRON HCL 4 MG/2ML IJ SOLN: 4.0000 mg | Freq: Four times a day (QID) | INTRAMUSCULAR | Status: DC | PRN

## 2013-03-03 MED ORDER — ALPRAZOLAM 0.5 MG PO TABS
0.5000 mg | ORAL_TABLET | Freq: Every day | ORAL | Status: DC
  Administered 2013-03-03 – 2013-03-05 (×3): 0.5 mg via ORAL
  Filled 2013-03-03 (×3): qty 1

## 2013-03-03 MED ORDER — ONDANSETRON HCL 4 MG PO TABS: 4.0000 mg | ORAL_TABLET | Freq: Four times a day (QID) | ORAL | Status: DC | PRN

## 2013-03-03 MED ORDER — DILTIAZEM HCL 100 MG IV SOLR
5.0000 mg/h | INTRAVENOUS | Status: DC
  Administered 2013-03-03: 15 mg/h via INTRAVENOUS
  Administered 2013-03-03: 5 mg/h via INTRAVENOUS
  Administered 2013-03-04: 15 mg/h via INTRAVENOUS
  Administered 2013-03-04: 5 mg/h via INTRAVENOUS
  Administered 2013-03-04 – 2013-03-05 (×2): 15 mg/h via INTRAVENOUS

## 2013-03-03 MED ORDER — DILTIAZEM HCL 25 MG/5ML IV SOLN
INTRAVENOUS | Status: AC
  Filled 2013-03-03: qty 5

## 2013-03-03 MED ORDER — SODIUM CHLORIDE 0.9 % IV SOLN
INTRAVENOUS | Status: DC
  Administered 2013-03-03: 20:00:00 via INTRAVENOUS

## 2013-03-03 MED ORDER — ENOXAPARIN SODIUM 40 MG/0.4ML ~~LOC~~ SOLN
40.0000 mg | SUBCUTANEOUS | Status: DC
  Administered 2013-03-03 – 2013-03-05 (×3): 40 mg via SUBCUTANEOUS
  Filled 2013-03-03 (×3): qty 0.4

## 2013-03-03 MED ORDER — SODIUM CHLORIDE 0.9 % IV BOLUS (SEPSIS)
500.0000 mL | Freq: Once | INTRAVENOUS | Status: AC
  Administered 2013-03-03: 500 mL via INTRAVENOUS

## 2013-03-03 MED ORDER — PANTOPRAZOLE SODIUM 40 MG PO TBEC
40.0000 mg | DELAYED_RELEASE_TABLET | ORAL | Status: DC
  Administered 2013-03-04 – 2013-03-06 (×3): 40 mg via ORAL
  Filled 2013-03-03 (×3): qty 1

## 2013-03-03 MED ORDER — SODIUM CHLORIDE 0.9 % IV SOLN
1.0000 g | Freq: Once | INTRAVENOUS | Status: AC
  Administered 2013-03-03: 1 g via INTRAVENOUS
  Filled 2013-03-03: qty 10

## 2013-03-03 MED ORDER — ACETAMINOPHEN 650 MG RE SUPP: 650.0000 mg | Freq: Four times a day (QID) | RECTAL | Status: DC | PRN

## 2013-03-03 NOTE — H&P (Signed)
[facility] History and Physical  Marcia Woods AVW:[id number] DOB: 1926-04-18 DOA: 03/03/2013  Referring physician: Dr. Roselyn Bering PCP: Carylon Perches, MD  Specialists:   Chief Complaint: Palpitations  HPI: Marcia Woods is a 79 y.o. female history of hyperlipidemia and GERD presents to the emergency room with complaints of palpitations. Patient reports onset of symptoms approximately 4 days ago. She reports that it has been persistent since then. She presents to the emergency room yesterday and was evaluated. It was noted that she was somewhat tachycardic with a sinus tachycardia. She was started on diltiazem and echocardiogram was performed. The patient was discharged home and followed up with her primary care physician. Prior to her discharge, it was noted that her heart rate was in the 60s to 70s and she felt significantly improved. After waking up this morning, she noted that her palpitations have returned. She came back to the emergency room where her heart rate was ranging anywhere between 100-190. The patient was started on a Cardizem infusion and then referred for admission. She denies any chest pain, shortness of breath, fever, cough, dysuria, vomiting, diarrhea. She's been having these symptoms on and off for quite sometime but attributed them to anxiety. She drinks one diet [facility] every day and reports that she does drink other caffeinated drinks as well.  Review of Systems: Pertinent positives as per history of present illness, otherwise negative  Past Medical History  Diagnosis Date  . Hyperlipemia   . Acid reflux   . Macular degeneration   . Constipation   . Anxiety   . Arthritis   . Vaginal atrophy    Past Surgical History  Procedure Laterality Date  . Knee arthroscopy  2010    left-[facility]  . Trigger finger release      right  . Hemorrhoid surgery    . Cataract surgery      bilaterally  . Abdominal hysterectomy    . Colonoscopy  04/30/2011     Procedure: COLONOSCOPY;  Surgeon: Malissa Hippo, MD;  Location: [facility];  Service: Endoscopy;  Laterality: N/A;  8:30 am  . Esophagogastroduodenoscopy  05/27/2011    Procedure: ESOPHAGOGASTRODUODENOSCOPY (EGD);  Surgeon: Malissa Hippo, MD;  Location: [facility];  Service: Endoscopy;  Laterality: N/A;  300  . Cervical disc surgery    . Cholecystectomy  01/26/2012    Procedure: LAPAROSCOPIC CHOLECYSTECTOMY;  Surgeon: [name], MD;  Location: [facility];  Service: General;  Laterality: N/A;   Social History:  reports that she has never smoked. She has never used smokeless tobacco. She reports that she does not drink alcohol or use illicit drugs.   No Known Allergies  Family History  Problem Relation Age of Onset  . Colon cancer Neg Hx   . Heart disease Mother   . Diabetes Mother   . Heart disease Father   . Diabetes Father     Prior to Admission medications   Medication Sig Start Date End Date Taking? Authorizing Provider  ALPRAZolam Prudy Feeler) 0.5 MG tablet Take 0.5 mg by mouth at bedtime. For sleep   Yes Historical Provider, MD  cholecalciferol (VITAMIN D) 1000 UNITS tablet Take 1,000 Units by mouth daily.   Yes Historical Provider, MD  diltiazem (CARDIZEM CD) 180 MG 24 hr capsule Take 180 mg by mouth daily.   Yes Historical Provider, MD  docusate sodium (COLACE) 100 MG capsule Take 200 mg by mouth every evening.   Yes Historical Provider, MD  Multiple Vitamins-Minerals (  CENTRUM SILVER PO) Take 1 tablet by mouth daily.     Yes Historical Provider, MD  Multiple Vitamins-Minerals (PRESERVISION AREDS 2 PO) Take 1 capsule by mouth 2 (two) times daily.    Yes Historical Provider, MD  pantoprazole (PROTONIX) 40 MG tablet Take 40 mg by mouth every morning.    Yes Historical Provider, MD  pravastatin (PRAVACHOL) 40 MG tablet Take 40 mg by mouth at bedtime.    Yes Historical Provider, MD  CELEBREX 200 MG capsule Take 200 mg by mouth daily as needed for pain. Patient states that she  takes 1 by mouth once a week ,or every 2 weeks ,as needed. 01/31/13   Historical Provider, MD   Physical Exam: Filed Vitals:   03/03/13 1600  BP: 140/56  Pulse: 74  Temp:   Resp: 14     General:  No acute distress  Eyes: Pupils are equal round, reactive to light  ENT: Mucous membranes are moist  Neck: Supple  Cardiovascular: S1, S2, tachycardic  Respiratory: Clear to auscultation bilaterally  Abdomen: Soft, nontender, positive bowel sounds  Skin: Normal  Musculoskeletal: No pedal edema bilaterally  Psychiatric: Normal affect, cooperative with exam  Neurologic: Grossly intact, nonfocal  Labs on Admission:  Basic Metabolic Panel:  Recent Labs Lab 03/02/13 1156 03/03/13 1231  NA 139 138  K 4.1 4.0  CL 102 101  CO2 28 27  GLUCOSE 102* 94  BUN 9 9  CREATININE 0.71 0.83  CALCIUM 10.1 10.0   Liver Function Tests:  Recent Labs Lab 03/02/13 1156 03/03/13 1231  AST 31 31  ALT 16 18  ALKPHOS 67 68  BILITOT 0.4 0.4  PROT 7.9 7.8  ALBUMIN 4.1 3.9   No results found for this basename: LIPASE, AMYLASE,  in the last 168 hours No results found for this basename: AMMONIA,  in the last 168 hours CBC:  Recent Labs Lab 03/02/13 1156 03/03/13 1231  WBC 4.8 4.6  NEUTROABS 2.7  --   HGB 14.7 14.7  HCT 44.4 44.5  MCV 91.9 91.8  PLT 219 231   Cardiac Enzymes:  Recent Labs Lab 03/02/13 1156  TROPONINI <0.30    BNP (last 3 results) No results found for this basename: PROBNP,  in the last 8760 hours CBG: No results found for this basename: GLUCAP,  in the last 168 hours  Radiological Exams on Admission: Dg Chest Portable 1 View  03/03/2013   CLINICAL DATA:  Chest pain  EXAM: PORTABLE CHEST - 1 VIEW  COMPARISON:  03/02/2013 and 05/19/2011  FINDINGS: Cardiomediastinal silhouette is stable. Stable chronic mild interstitial prominence. No acute infiltrate or pulmonary edema. Metallic fixation plate lower cervical spine again noted. Tiny nodular calcification  just inferior to proximal right clavicle is stable from prior exams.  IMPRESSION: No active disease.  No significant change.   Electronically Signed   By: Natasha Mead   On: 03/03/2013 13:16   Dg Chest Portable 1 View  03/02/2013   *RADIOLOGY REPORT*  Clinical Data: Tachycardia  PORTABLE CHEST - 1 VIEW  Comparison: Chest radiograph 05/19/2011  Findings: Anterior cervical spinal fusion hardware redemonstrated. Stable cardiac and mediastinal contours.  No consolidative pulmonary opacities.  No pleural effusion or pneumothorax. Regional skeleton is grossly unremarkable  IMPRESSION: No acute cardiopulmonary process.   Original Report Authenticated By: Annia Belt, M.D    EKG: Independently reviewed. Sinus tachycardia  Assessment/Plan Active Problems:   Sinus tachycardia   Palpitations   Other and unspecified hyperlipidemia   GERD (gastroesophageal  reflux disease)   1. Tachycardia. Patient is noted to be significantly tachycardic with a heart rate ranging in the 130s to 140s. She's currently on a Cardizem infusion was running between 10-15 mg per hour. Repeat EKG indicates that the patient does have sinus rhythm, but she may be having bursts of SVT.will check thyroid studies, d-dimer. Echocardiogram was done yesterday and appears to be unremarkable. Anxiety may also be playing a role here. We'll repeat EKG in the morning, cycle cardiac markers. If patient continues to remain tachycardic, may need cardiology input.  2. Hyperlipidemia. Continue statin 3. GERD. Continue PPI   Patient will be admitted to the service of Dr. Ouida Sills. [facility] will be available for any issues until 7 AM on 9/14  Code Status: full code Family Communication: discussed with patient Disposition Plan: discharge home once improved  Time spent:  MEMON,JEHANZEB [facility] Pager [phone]  If 7PM-7AM, please contact night-coverage [email or web address] Password [facility] 03/03/2013, 5:23 PM

## 2013-03-03 NOTE — ED Provider Notes (Addendum)
CSN: [id number]     Arrival date & time 03/03/13  1155 History  This chart was scribed for Celene Kras, MD by Quintella Reichert, ED scribe.  This patient was seen in room [facility] and the patient's care was started at 12:10 PM. Chief complaint tachycardia The history is provided by the patient and a relative. No language interpreter was used.   HPI Comments: Marcia Woods is a 79 y.o. female with h/o hyperlipemia who presents to the Emergency Department complaining of gradually-worsening tachycardia that began 4 days ago.  Pt states she has had similar "attacks" every few months for 4-5 years, but she attributed these episodes to anxiety and they were never as persistent as in the past week and she never sought treatment on those past occasions.  4 days ago she noticed her heart rate alternating between normal and rapid.and this pattern persisted for the following 2 days.  She states she sometimes also feels mildly lightheaded in association with her palpitations.  She denies CP, SOB, dizziness, or diaphoresis.  2 days ago she was seen by her PCP who performed an EKG and found that her heart rate was ranging from 76-196. He sent her home with dlitiazem 180 mg and advised that she come to the hospital but she states that she wanted to see if the ditiazem would relieve her symptoms first.  It did not provide any relief and pt was seen in the ED yesterday and sent home after her tachycardia was brought under control with instructions to continue diltiazem treatment and f/u next week.  Pt returned today because her tachycardia has persisted.   Past Medical History  Diagnosis Date  . Hyperlipemia   . Acid reflux   . Macular degeneration   . Constipation   . Anxiety   . Arthritis   . Vaginal atrophy     Past Surgical History  Procedure Laterality Date  . Knee arthroscopy  2010    left-[facility]  . Trigger finger release      right  . Hemorrhoid surgery    . Cataract surgery       bilaterally  . Abdominal hysterectomy    . Colonoscopy  04/30/2011    Procedure: COLONOSCOPY;  Surgeon: Malissa Hippo, MD;  [facility];  Service: Endoscopy;  Laterality: N/A;  8:30 am  . Esophagogastroduodenoscopy  05/27/2011    Procedure: ESOPHAGOGASTRODUODENOSCOPY (EGD);  Surgeon: Malissa Hippo, MD;  Location: [facility];  Service: Endoscopy;  Laterality: N/A;  300  . Cervical disc surgery    . Cholecystectomy  01/26/2012    Procedure: LAPAROSCOPIC CHOLECYSTECTOMY;  Surgeon: [name], MD;  Location: [facility];  Service: General;  Laterality: N/A;    Family History  Problem Relation Age of Onset  . Colon cancer Neg Hx   . Heart disease Mother   . Diabetes Mother   . Heart disease Father   . Diabetes Father     History  Substance Use Topics  . Smoking status: Never Smoker   . Smokeless tobacco: Never Used  . Alcohol Use: No    OB History   Grav Para Term Preterm Abortions TAB SAB Ect Mult Living   2 2               Review of Systems  Constitutional: Negative for diaphoresis.  Respiratory: Negative for shortness of breath.   Cardiovascular: Positive for palpitations. Negative for chest pain.  Neurological: Positive for light-headedness.  A complete  10 system review of systems was obtained and all systems are negative except as noted in the HPI and PMH.    Allergies  Review of patient's allergies indicates no known allergies.  Home Medications   Current Outpatient Rx  Name  Route  Sig  Dispense  Refill  . ALPRAZolam (XANAX) 0.5 MG tablet   Oral   Take 0.5 mg by mouth at bedtime. For sleep         . cholecalciferol (VITAMIN D) 1000 UNITS tablet   Oral   Take 1,000 Units by mouth daily.         Marland Kitchen diltiazem (CARDIZEM CD) 180 MG 24 hr capsule   Oral   Take 180 mg by mouth daily.         Marland Kitchen docusate sodium (COLACE) 100 MG capsule   Oral   Take 200 mg by mouth every evening.         . Multiple Vitamins-Minerals (CENTRUM SILVER PO)    Oral   Take 1 tablet by mouth daily.           . Multiple Vitamins-Minerals (PRESERVISION AREDS 2 PO)   Oral   Take 1 capsule by mouth 2 (two) times daily.          . pantoprazole (PROTONIX) 40 MG tablet   Oral   Take 40 mg by mouth every morning.          . pravastatin (PRAVACHOL) 40 MG tablet   Oral   Take 40 mg by mouth at bedtime.          . CELEBREX 200 MG capsule   Oral   Take 200 mg by mouth daily as needed for pain. Patient states that she takes 1 by mouth once a week ,or every 2 weeks ,as needed.          BP 102/42  Pulse 188  Temp(Src) 98.6 F (37 C) (Oral)  Resp 14  Ht 5\' 4"  (1.626 m)  Wt 139 lb (63.05 kg)  BMI 23.85 kg/m2  SpO2 98%  Physical Exam  Nursing note and vitals reviewed. Constitutional: She appears well-developed and well-nourished. No distress.  HENT:  Head: Normocephalic and atraumatic.  Right Ear: External ear normal.  Left Ear: External ear normal.  Eyes: Conjunctivae are normal. Right eye exhibits no discharge. Left eye exhibits no discharge. No scleral icterus.  Neck: Neck supple. No tracheal deviation present.  Cardiovascular: Intact distal pulses.  An irregularly irregular rhythm present. Tachycardia present.   Pulmonary/Chest: Effort normal and breath sounds normal. No stridor. No respiratory distress. She has no wheezes. She has no rales.  Abdominal: Soft. Bowel sounds are normal. She exhibits no distension. There is no tenderness. There is no rebound and no guarding.  Musculoskeletal: She exhibits no edema and no tenderness.  Neurological: She is alert. She has normal strength. No sensory deficit. Cranial nerve deficit:  no gross defecits noted. She exhibits normal muscle tone. She displays no seizure activity. Coordination normal.  Skin: Skin is warm and dry. No rash noted.  Psychiatric: She has a normal mood and affect.    ED Course  Procedures (including critical care time) Medications  diltiazem (CARDIZEM) 100 mg in  dextrose 5 % 100 mL infusion (5 mg/hr Intravenous New Bag/Given 03/03/13 1223)  sodium chloride 0.9 % bolus 500 mL (0 mLs Intravenous Stopped 03/03/13 1244)  calcium gluconate 1 g in sodium chloride 0.9 % 100 mL IVPB (0 g Intravenous Stopped 03/03/13 1348)  DIAGNOSTIC STUDIES: Oxygen Saturation is 98% on room air, normal by my interpretation.    COORDINATION OF CARE: 12:21 PM: Discussed treatment plan which includes EKG, Cardizem,CXR and labs .  Pt expressed understanding and agreed to plan.  CRITICAL CARE Performed by: Celene Kras Total critical care time: 30 Critical care time was exclusive of separately billable procedures and treating other patients. Critical care was necessary to treat or prevent imminent or life-threatening deterioration. Critical care was time spent personally by me on the following activities: development of treatment plan with patient and/or surrogate as well as nursing, discussions with consultants, evaluation of patient's response to treatment, examination of patient, obtaining history from patient or surrogate, ordering and performing treatments and interventions, ordering and review of laboratory studies, ordering and review of radiographic studies, pulse oximetry and re-evaluation of patient's condition.   EKG Rate 106 narrow complex tachycardia irregularly irregular Normal axis, normal intervals Normal ST-T waves suspect intermittent atrial fibrillation  Labs Review Labs Reviewed  COMPREHENSIVE METABOLIC PANEL - Abnormal; Notable for the following:    GFR calc non Af Amer 63 (*)    GFR calc Af Amer 73 (*)    All other components within normal limits  CBC  PROTIME-INR  APTT  POCT I-STAT TROPONIN I   Imaging Review Dg Chest Portable 1 View  03/03/2013   CLINICAL DATA:  Chest pain  EXAM: PORTABLE CHEST - 1 VIEW  COMPARISON:  03/02/2013 and 05/19/2011  FINDINGS: Cardiomediastinal silhouette is stable. Stable chronic mild interstitial prominence. No  acute infiltrate or pulmonary edema. Metallic fixation plate lower cervical spine again noted. Tiny nodular calcification just inferior to proximal right clavicle is stable from prior exams.  IMPRESSION: No active disease.  No significant change.   Electronically Signed   By: Natasha Mead   On: 03/03/2013 13:16   Dg Chest Portable 1 View  03/02/2013   *RADIOLOGY REPORT*  Clinical Data: Tachycardia  PORTABLE CHEST - 1 VIEW  Comparison: Chest radiograph 05/19/2011  Findings: Anterior cervical spinal fusion hardware redemonstrated. Stable cardiac and mediastinal contours.  No consolidative pulmonary opacities.  No pleural effusion or pneumothorax. Regional skeleton is grossly unremarkable  IMPRESSION: No acute cardiopulmonary process.   Original Report Authenticated By: Annia Belt, M.D   1310 the patient improved. Heart rate is now in 80s on the Cardizem drip.  MDM   1. Atrial fibrillation with rapid ventricular response    The patient responded to the Cardizem drip. Currently her blood pressure is 127/72 and the latest heart rate was in the 80s.   I favor atrial fibrillation with rapid ventricular response rather than some type of supraventricular tachycardia. Considering the fact that she was seen yesterday for the same problem and had to return to the emergent despite taking Cardizem as an outpatient, I will consult the medical service regarding admission for observation. Possible cardiology consultation   I personally performed the services described in this documentation, which was scribed in my presence.  The recorded information has been reviewed and is accurate.    Celene Kras, MD 03/03/13 1415  Celene Kras, MD 03/03/13 [phone]

## 2013-03-03 NOTE — ED Notes (Signed)
Pt states tachycardia which began Tuesday, intermittently. Seen Dr. Ouida Sills on Thursday and was put on diltiazem 180mg , which she took this morning. Pt was here in the ED for same yesterday and states she was given lopressor which she states helped yesterday. Only other symptom is weakness.

## 2013-03-04 ENCOUNTER — Inpatient Hospital Stay (HOSPITAL_COMMUNITY): Payer: Medicare Other

## 2013-03-04 LAB — CBC
HCT: 40.9 % (ref 36.0–46.0)
MCHC: 33.3 g/dL (ref 30.0–36.0)
MCV: 92.3 fL (ref 78.0–100.0)
Platelets: 189 10*3/uL (ref 150–400)
RDW: 13.3 % (ref 11.5–15.5)

## 2013-03-04 LAB — BASIC METABOLIC PANEL
BUN: 10 mg/dL (ref 6–23)
Calcium: 9 mg/dL (ref 8.4–10.5)
Chloride: 106 mEq/L (ref 96–112)
Creatinine, Ser: 0.71 mg/dL (ref 0.50–1.10)
GFR calc Af Amer: 89 mL/min — ABNORMAL LOW (ref >90)

## 2013-03-04 LAB — TSH: TSH: 1.264 u[IU]/mL (ref 0.350–4.500)

## 2013-03-04 MED ORDER — IOHEXOL 350 MG/ML SOLN
100.0000 mL | Freq: Once | INTRAVENOUS | Status: AC | PRN
  Administered 2013-03-04: 100 mL via INTRAVENOUS

## 2013-03-04 MED ORDER — ATORVASTATIN CALCIUM 10 MG PO TABS
10.0000 mg | ORAL_TABLET | Freq: Every day | ORAL | Status: DC
  Administered 2013-03-04 – 2013-03-05 (×2): 10 mg via ORAL
  Filled 2013-03-04 (×2): qty 1

## 2013-03-04 MED ORDER — METOPROLOL TARTRATE 1 MG/ML IV SOLN
5.0000 mg | Freq: Four times a day (QID) | INTRAVENOUS | Status: DC
  Filled 2013-03-04 (×2): qty 5

## 2013-03-05 DIAGNOSIS — I471 Supraventricular tachycardia, unspecified: Secondary | ICD-10-CM

## 2013-03-05 HISTORY — DX: Supraventricular tachycardia: I47.1

## 2013-03-05 HISTORY — DX: Supraventricular tachycardia, unspecified: I47.10

## 2013-03-05 MED ORDER — ASPIRIN EC 81 MG PO TBEC
81.0000 mg | DELAYED_RELEASE_TABLET | Freq: Every day | ORAL | Status: DC
  Administered 2013-03-05 – 2013-03-06 (×2): 81 mg via ORAL
  Filled 2013-03-05 (×2): qty 1

## 2013-03-05 MED ORDER — DILTIAZEM HCL 60 MG PO TABS: 75.0000 mg | ORAL_TABLET | Freq: Four times a day (QID) | ORAL | Status: DC

## 2013-03-05 MED ORDER — METOPROLOL TARTRATE 25 MG PO TABS
25.0000 mg | ORAL_TABLET | Freq: Two times a day (BID) | ORAL | Status: DC
  Administered 2013-03-05 – 2013-03-06 (×3): 25 mg via ORAL
  Filled 2013-03-05 (×3): qty 1

## 2013-03-05 MED ORDER — DILTIAZEM HCL 60 MG PO TABS
75.0000 mg | ORAL_TABLET | Freq: Four times a day (QID) | ORAL | Status: DC
  Administered 2013-03-05 (×2): 75 mg via ORAL
  Filled 2013-03-05 (×2): qty 1

## 2013-03-05 MED ORDER — DILTIAZEM HCL 100 MG IV SOLR
5.0000 mg/h | INTRAVENOUS | Status: DC
  Filled 2013-03-05: qty 100

## 2013-03-05 NOTE — Progress Notes (Signed)
UR Chart Review Completed  

## 2013-03-05 NOTE — Consult Note (Signed)
CARDIOLOGY CONSULT NOTE   Patient ID: Marcia Woods MRN: [id number] DOB/AGE: 1926/03/24 79 y.o.  Admit Date: 03/03/2013 Referring Physician: Carylon Perches Primary Physician: Carylon Perches, MD Consulting Cardiologist: Zaynah Chawla MD Reason for Consultation: Tachycardia/SVT   Clinical Summary Marcia Woods is a 79 y.o.female with history of hyperlipidemia and GERD, now admitted on 03/03/2013 with complaints of racing heart beat. The patient's symptoms actually began on Tuesday of last week, when she noticed rapid heart rate causing her to feel a little lightheaded , and weak . She had no associated chest pain or shortness of breath . She did have some low-grade nausea. The rapid heart rate was intermittent lasting from 5-10 minutes and subsiding on its own. This occurred all day Tuesday and Wednesday.          She saw Dr. Ouida Sills on Thursday.  ECG was completed revealing normal sinus rhythm. However, once the ECG was completed, heart rate increased again. This was evaluated by Dr. Ouida Sills apically to confirm her heart rate was very rapid. She was started on diltiazem 180 mg daily. The following day,she presented to the emergency room as her heart rate was very rapid again and she was feeling weak . In ER her heart rate was fluctuating between 76 beats per minute and 196 beats per minute. Echocardiogram was completed at that time. Demonstrating normal ejection fraction, no wall motion abnormalities, or hypertrophy. The patient was sent home and continued on diltiazem.   The following morning, the patient's heart rate was again elevated racing, with symptoms of generalized fatigue. She states that if she did not sit down she felt that she may fall, although she did not complain of any presyncope. The patient was started on a diltiazem drip, and admitted to ICU for further evaluation. ECG revealed normal sinus rhythm.   The patient admits to drinking a lot of caffeine, but has cut down. She was drinking a lot of  [employer] and coffee. She has changed to decaf. She admits to having had these episodes approximately 10-15 years ago when she was drinking a lot of caffeine. She was told at that time to cut down but admits to sometimes drinking more caffeine than she is "supposed to."  No Known Allergies  Medications Scheduled Medications: . ALPRAZolam  0.5 mg Oral QHS  . atorvastatin  10 mg Oral q1800  . enoxaparin (LOVENOX) injection  40 mg Subcutaneous Q24H  . metoprolol  5 mg Intravenous Q6H  . pantoprazole  40 mg Oral BH-q7a     Infusions: . sodium chloride 10 mL/hr at 03/05/13 0600  . diltiazem (CARDIZEM) infusion 15 mg/hr (03/05/13 0600)     PRN Medications:  acetaminophen, acetaminophen, ondansetron (ZOFRAN) IV, ondansetron   Past Medical History  Diagnosis Date  . Hyperlipemia   . Acid reflux   . Macular degeneration   . Constipation   . Anxiety   . Arthritis   . Vaginal atrophy     Past Surgical History  Procedure Laterality Date  . Knee arthroscopy  2010    left-[facility]  . Trigger finger release      right  . Hemorrhoid surgery    . Cataract surgery      bilaterally  . Abdominal hysterectomy    . Colonoscopy  04/30/2011    Procedure: COLONOSCOPY;  Surgeon: Malissa Hippo, MD;  Location: [facility];  Service: Endoscopy;  Laterality: N/A;  8:30 am  . Esophagogastroduodenoscopy  05/27/2011    Procedure: ESOPHAGOGASTRODUODENOSCOPY (EGD);  Surgeon: Joline Maxcy  Karilyn Cota, MD;  Location: [facility];  Service: Endoscopy;  Laterality: N/A;  300  . Cervical disc surgery    . Cholecystectomy  01/26/2012    Procedure: LAPAROSCOPIC CHOLECYSTECTOMY;  Surgeon: [name], MD;  Location: [facility];  Service: General;  Laterality: N/A;    Family History  Problem Relation Age of Onset  . Colon cancer Neg Hx   . Heart disease Mother   . Diabetes Mother   . Heart disease Father   . Diabetes Father     Social History Marcia Woods reports that she has never smoked. She has  never used smokeless tobacco. Marcia Woods reports that she does not drink alcohol.  Review of Systems Otherwise reviewed and negative except as outlined.  Physical Examination Blood pressure 108/53, pulse 56, temperature 97.5 F (36.4 C), temperature source Oral, resp. rate 9, height 5\' 5"  (1.651 m), weight 143 lb 8.3 oz (65.1 kg), SpO2 96.00%.  Intake/Output Summary (Last 24 hours) at 03/05/13 0801 Last data filed at 03/05/13 0600  Gross per 24 hour  Intake 1820.84 ml  Output    350 ml  Net 1470.84 ml   Telemetry: Sinus rhythm at present.  HEENT: Conjunctiva and lids normal, oropharynx clear with moist mucosa. Neck: Supple, no elevated JVP or carotid bruits, no thyromegaly. Lungs: Clear to auscultation, nonlabored breathing at rest. Cardiac: Regular rate and rhythm, no S3 or significant systolic murmur, no pericardial rub. Abdomen: Soft, nontender, no hepatomegaly, bowel sounds present, no guarding or rebound. Extremities: No pitting edema, distal pulses 2+. Skin: Warm and dry. Musculoskeletal: No kyphosis. Neuropsychiatric: Alert and oriented x3, affect grossly appropriate.   Lab Results  Basic Metabolic Panel:  Recent Labs Lab 03/02/13 1156 03/03/13 1231 03/04/13 0550  NA 139 138 139  K 4.1 4.0 3.6  CL 102 101 106  CO2 28 27 25   GLUCOSE 102* 94 95  BUN 9 9 10   CREATININE 0.71 0.83 0.71  CALCIUM 10.1 10.0 9.0    Liver Function Tests:  Recent Labs Lab 03/02/13 1156 03/03/13 1231  AST 31 31  ALT 16 18  ALKPHOS 67 68  BILITOT 0.4 0.4  PROT 7.9 7.8  ALBUMIN 4.1 3.9    CBC:  Recent Labs Lab 03/02/13 1156 03/03/13 1231 03/04/13 0550  WBC 4.8 4.6 6.7  NEUTROABS 2.7  --   --   HGB 14.7 14.7 13.6  HCT 44.4 44.5 40.9  MCV 91.9 91.8 92.3  PLT 219 231 189    TSH 1.264   Cardiac Enzymes:  Recent Labs Lab 03/02/13 1156 03/03/13 1757 03/03/13 2324 03/04/13 0550  TROPONINI <0.30 <0.30 <0.30 <0.30    Echocardiogram: 03/02/2013 Left ventricle:  The cavity size was normal. There was mild focal basal hypertrophy with no obstruction. Systolic function was normal. The estimated ejection fraction was in the range of 60% to 65%. Wall motion was normal; there were no regional wall motion abnormalities. The study is not technically sufficient to allow evaluation of LV diastolic function. There is evidence of elevated left atrial pressure. - Aortic valve: Valve area: 1.71cm^2(VTI). - Left atrium: The atrium was mildly dilated.   Radiology: Ct Angio Chest Pe W/cm &/or Wo Cm  03/04/2013   *RADIOLOGY REPORT*  Clinical Data: Short of breath, elevated D-dimer  CT ANGIOGRAPHY CHEST  Technique:    IMPRESSION:  1.  Focal ground-glass attenuation opacity in the posterior left upper lobe with a central air bronchogram is most consistent with early bronchopneumonia.  A low grade adenocarcinoma  could have a similar appearance and follow up imaging to resolution is recommended.  Recommend repeat CT scan of the chest in 6 weeks following an appropriate course of therapy.  2.  Negative for pulmonary embolus.  3.  Nonspecific 4 mm subpleural pulmonary nodule in the right lung apex.  If the patient is at high risk for bronchogenic carcinoma, follow-up chest CT at 1 year is recommended.  If the patient is at low risk, no follow-up is needed.  This recommendation follows the consensus statement: Guidelines for Management of Small Pulmonary Nodules Detected on CT Scans:  A Statement from the Fleischner Society as published in Radiology 2005; 237:395-400.   Original Report Authenticated By: Malachy Moan, M.D.    Dg Chest Portable 1 View  03/03/2013   CLINICAL DATA:  Chest pain  EXAM: PORTABLE CHEST - 1 VIEW  COMPARISON:  03/02/2013 and 05/19/2011  FINDINGS: Cardiomediastinal silhouette is stable. Stable chronic mild interstitial prominence. No acute infiltrate or pulmonary edema. Metallic fixation plate lower cervical spine again noted. Tiny nodular calcification  just inferior to proximal right clavicle is stable from prior exams.  IMPRESSION: No active disease.  No significant change.   Electronically Signed   By: Natasha Mead   On: 03/03/2013 13:16    ECGs: Sinus rhythm with prolonged PR interval. Tachycardia episodes are narrow complex, consider atypical atrial flutter, ectopic atrial tachycardia, possibly even reentrant long RP tachycardia.   Impression and Recommendations  1. PSVT: This has been intermittent for the last 5 days intermittently,  Recent institution of oral calcium channel blocker by Dr.Fagan, She is currently on Cardizem drip at 15 mg an hour, has had occasional recurrent tachycardia. Review of telemetry and ECG, demonstrate episodes of possible atypical atrial flutter versus atrial tachycardia or reentrant long R=P tachycardia. TSH is normal, potassium is low normal at 3.6. Echocardiogram reveals normal ejection fraction with no LVH, left atrium was mildly dilated. Blood pressure is tolerating Cardizem  without significant hypotension. We will consider a change to oral regimen.  2. Hypercholesterolemia: On pravastatin 40 mg at home. We will restart.   3. GERD: Remains on protonix 40 mg daily.   Signed: Bettey Mare. Lyman Bishop NP [facility] 03/05/2013, 8:01 AM   Attending note:  Patient seen and examined. Modified above note by Ms. Lawrence NP. Ms. Viverette presents with recent flareup of rapid palpitations over the last week as described. She does admit to having more brief episodes at least over the last 5 years. No syncope associated with this, no chest pain. Cardiac markers argue against ACS. Recent echocardiogram shows normal LVEF. ECGs reviewed - rhythm could be atypical atrial flutter, although just as likely would be atrial tachycardia or perhaps even a reentrant long RP tachycardia. Sudden onset and offset has been noted. She is on high-dose intravenous Cardizem at this time.  Plan will be to initiate oral Cardizem at 300  mg total dose daily, and also add oral Lopressor beginning at 25 mg twice daily. Suspect a combination regimen will be more effective than single formulation. She may even require amiodarone. Otherwise start aspirin, no anticoagulation at this time. We will follow with you. Hopefully she will be able to go home soon, presuming she tolerates conversion to oral treatment.  Jonelle Sidle, M.D., F.A.C.C.

## 2013-03-05 NOTE — Progress Notes (Signed)
Marcia Woods, Marcia Woods               ACCOUNT NO.:  [id number]  MEDICAL RECORD NO.:  [id number]  LOCATION:                            FACILITY:  [facility]  PHYSICIAN:  Kingsley Callander. Ouida Sills, MD       DATE OF BIRTH:  September 20, 1925  DATE OF PROCEDURE: DATE OF DISCHARGE:                                PROGRESS NOTE   The patient was admitted yesterday with recurrent tachycardia symptoms. She was found to have recurrent SVT and has been treated with a diltiazem drip.  She has had variable heart rates and appears to now possibly be in atrial flutter on telemetry monitoring with the rate in the 120-130 range.  She has not been hypotensive.  She has had normal troponins.  She had an echo performed on Friday which revealed normal left ventricular function with an ejection fraction of 60%.  There were no wall motion abnormalities.  There were no significant valvular abnormalities.  There was mild left atrial enlargement.  She denies chest pain.  She is not having difficulty breathing.  She was found to have a mildly elevated D-dimer.  PHYSICAL EXAMINATION:  VITAL SIGNS:  Temperature 97.9, heart rate in the 120s, blood pressure 118/54, oxygen saturation 96%. GENERAL:  No distress.  Alert and comfortable. LUNGS:  Clear. HEART:  Tachycardic and irregular. ABDOMEN:  Soft and nontender. EXTREMITIES:  No calf tenderness or swelling.  No edema. NEURO:  At baseline.  IMPRESSION/PLAN: 1. Recurrent supraventricular tachycardia with now possible atrial     flutter.  She is euthyroid.  TSH was performed as an outpatient     last week and was normal.  We will obtain a cardiology consultation     tomorrow morning.  We will continue IV diltiazem and will increase     her rate to 15 mg an hour now and we will also add IV Lopressor 5     mg every 6 hours.  Her kidney function, electrolytes, and blood     counts are normal. 2. Elevated D-dimer.  We will obtain a CT angiogram of the chest to     rule out pulmonary  embolus.     Kingsley Callander. Ouida Sills, MD     ROF/MEDQ  D:  03/04/2013  T:  03/04/2013  Job:  [id number]

## 2013-03-05 NOTE — Progress Notes (Signed)
Marcia Woods, Marcia Woods               ACCOUNT NO.:  [id number]  MEDICAL RECORD NO.:  [id number]  LOCATION:  [facility]                          FACILITY:  [facility]  PHYSICIAN:  Kingsley Callander. Ouida Sills, MD       DATE OF BIRTH:  06-Nov-1925  DATE OF PROCEDURE:  03/05/2013 DATE OF DISCHARGE:                                PROGRESS NOTE   SUBJECTIVE:  Patient has remained in sinus rhythm overnight without recurrent episodes of tachycardia.  She continues to receive diltiazem at 15 mg/hour.  She only received one dose of IV metoprolol yesterday. Her heart rate now is in the 60s in sinus rhythm on telemetry.  PHYSICAL EXAMINATION:  VITAL SIGNS:  Pulse 65, blood pressure 108/53, oxygen saturation 100%. LUNGS:  Clear. HEART:  Regular with no murmurs. ABDOMEN:  Soft and nontender. EXTREMITIES:  No edema.  IMPRESSION/PLAN: 1. Supraventricular tachycardia.  Cardiology consult pending today.     Continue IV diltiazem. 2. Elevated D-dimer.  Her chest CT reveals no evidence of pulmonary     embolus.  There was a focal ground-glass attenuation opacity in the     posterior left upper lobe, consistent with early bronchial     pneumonia.  Low-grade adenocarcinoma could not be ruled out.  She     has no symptoms of pneumonia.  She has no cough, sputum production,     or fever.  Her white count is 6.7.     Kingsley Callander. Ouida Sills, MD     ROF/MEDQ  D:  03/05/2013  T:  03/05/2013  Job:  [id number]

## 2013-03-06 LAB — BASIC METABOLIC PANEL
BUN: 11 mg/dL (ref 6–23)
Chloride: 107 mEq/L (ref 96–112)
Glucose, Bld: 94 mg/dL (ref 70–99)
Potassium: 3.9 mEq/L (ref 3.5–5.1)

## 2013-03-06 MED ORDER — ASPIRIN 81 MG PO TBEC: 81.0000 mg | DELAYED_RELEASE_TABLET | Freq: Every day | ORAL | Status: DC

## 2013-03-06 MED ORDER — METOPROLOL TARTRATE 25 MG PO TABS: 25.0000 mg | ORAL_TABLET | Freq: Two times a day (BID) | ORAL | Status: DC

## 2013-03-06 NOTE — Progress Notes (Signed)
Discussed discharge and AVS with patient and pt's daughter in law, both verbalized understanding. Pt vital signs are stable and pt is ready for discharge.

## 2013-03-06 NOTE — Discharge Summary (Signed)
NAMESONALI, WIVELL               ACCOUNT NO.:  [id number]  MEDICAL RECORD NO.:  [id number]  LOCATION:                            FACILITY:  [facility]  PHYSICIAN:  Kingsley Callander. Ouida Sills, MD       DATE OF BIRTH:  [birth year]  DATE OF ADMISSION:  03/03/2013 DATE OF DISCHARGE:  [facility]                              DISCHARGE SUMMARY   DISCHARGED DIAGNOSES: 1. Atrial flutter. 2. Hyperlipidemia. 3. Anxiety. 4. Macular degeneration. 5. Elevated D-dimer. 6. Left upper lobe opacity on chest CT. 7. Gastroesophageal reflux disease.  DISCHARGE MEDICATIONS: 1. Lopressor 25 mg b.i.d. 2. Diltiazem CD 180 mg daily. 3. Xanax 0.5 mg at bedtime. 4. Vitamin D 1000 units daily. 5. Colace 200 mg daily. 6. Multivitamin daily. 7. PreserVision1 b.i.d. 8. Protonix 40 mg daily. 9. Pravachol 40 mg daily. 10.Aspirin 81 mg daily.  HOSPITAL COURSE:  This patient is an 79 year old female, who presented to the emergency room with recurrent rapid heart rate.  She had been seen in the office 2 days prior to admission, at which time her EKG revealed a supraventricular tachycardia at 190 beats per minute.  She had been started on diltiazem 180 mg daily.  She was seen in the emergency room, the next day and had been treated with metoprolol for heart rates up to the 140 range.  She then returned on Saturday with a sustained heart rate elevations requiring a hospitalization.  She was started on IV diltiazem.  She converted to sinus rhythm.  She was felt to likely have an atypical atrial flutter.  She was seen in Cardiology consultation by Dr. Diona Browner.  Metoprolol was added.  She is euthyroid with a TSH of 1.26.  Her echo revealed normal left ventricular function with no wall motion abnormalities or significant valvular abnormalities.  She had an elevated D-dimer of 0.8.  She underwent a CT angiogram of the chest, which revealed a focal ground-glass attenuation airspace opacity. In the posterior left upper lung.   This was felt to possibly be consistent with early no pneumonia, but she has no symptoms of pneumonia.  She has not had a fever.  She does not have a leukocytosis. She denies cough.  It was felt that this could also be a low-grade adenocarcinoma.  A followup CT scan will be obtained in 6 weeks.  There was no evidence of pulmonary embolus. Cardiac enzymes were normal.  She is now maintaining sinus rhythm, with rates in the 60s.  She was felt to be improved and stable for discharge on the morning of the March 06, 2013.  She will be seen in followup in my office in 1 week.  She was started on aspirin 81 mg daily.     Kingsley Callander. Ouida Sills, MD     ROF/MEDQ  D:  03/06/2013  T:  03/06/2013  Job:  [id number]

## 2013-03-06 NOTE — Progress Notes (Signed)
Patient was very unsteady when ambulating to the bathroom.

## 2013-03-07 ENCOUNTER — Encounter (INDEPENDENT_AMBULATORY_CARE_PROVIDER_SITE_OTHER): Payer: Self-pay

## 2013-04-03 ENCOUNTER — Emergency Department (HOSPITAL_COMMUNITY): Payer: Medicare Other

## 2013-04-03 ENCOUNTER — Encounter (HOSPITAL_COMMUNITY): Payer: Self-pay | Admitting: Emergency Medicine

## 2013-04-03 ENCOUNTER — Emergency Department (HOSPITAL_COMMUNITY)
Admission: EM | Admit: 2013-04-03 | Discharge: 2013-04-03 | Disposition: A | Discharge status: Home / Self Care | Payer: Medicare Other | Attending: Emergency Medicine | Admitting: Emergency Medicine

## 2013-04-03 DIAGNOSIS — S82402A Unspecified fracture of shaft of left fibula, initial encounter for closed fracture: Secondary | ICD-10-CM

## 2013-04-03 DIAGNOSIS — Z7982 Long term (current) use of aspirin: Secondary | ICD-10-CM | POA: Insufficient documentation

## 2013-04-03 DIAGNOSIS — K219 Gastro-esophageal reflux disease without esophagitis: Secondary | ICD-10-CM | POA: Insufficient documentation

## 2013-04-03 DIAGNOSIS — F411 Generalized anxiety disorder: Secondary | ICD-10-CM | POA: Insufficient documentation

## 2013-04-03 DIAGNOSIS — S82899A Other fracture of unspecified lower leg, initial encounter for closed fracture: Secondary | ICD-10-CM | POA: Insufficient documentation

## 2013-04-03 DIAGNOSIS — Z79899 Other long term (current) drug therapy: Secondary | ICD-10-CM | POA: Insufficient documentation

## 2013-04-03 DIAGNOSIS — E785 Hyperlipidemia, unspecified: Secondary | ICD-10-CM | POA: Insufficient documentation

## 2013-04-03 DIAGNOSIS — Z8669 Personal history of other diseases of the nervous system and sense organs: Secondary | ICD-10-CM | POA: Insufficient documentation

## 2013-04-03 DIAGNOSIS — K5900 Constipation, unspecified: Secondary | ICD-10-CM | POA: Insufficient documentation

## 2013-04-03 DIAGNOSIS — Z8742 Personal history of other diseases of the female genital tract: Secondary | ICD-10-CM | POA: Insufficient documentation

## 2013-04-03 DIAGNOSIS — H539 Unspecified visual disturbance: Secondary | ICD-10-CM | POA: Insufficient documentation

## 2013-04-03 DIAGNOSIS — W010XXA Fall on same level from slipping, tripping and stumbling without subsequent striking against object, initial encounter: Secondary | ICD-10-CM | POA: Insufficient documentation

## 2013-04-03 DIAGNOSIS — Y929 Unspecified place or not applicable: Secondary | ICD-10-CM | POA: Insufficient documentation

## 2013-04-03 DIAGNOSIS — M129 Arthropathy, unspecified: Secondary | ICD-10-CM | POA: Insufficient documentation

## 2013-04-03 DIAGNOSIS — Y939 Activity, unspecified: Secondary | ICD-10-CM | POA: Insufficient documentation

## 2013-04-03 NOTE — ED Notes (Signed)
Pt c/o left ankle pain and swelling after a fall that occurred last night. Pt states she tripped over pillow and fell. Pt denies hitting head and denies LOC. Ice pack applied to injured area.

## 2013-04-03 NOTE — ED Notes (Signed)
Tripped and fell last night , Pain lt ankle , swelling present. No LOC,  Alert, talking

## 2013-04-03 NOTE — ED Provider Notes (Signed)
CSN: [id number]     Arrival date & time 04/03/13  1421 History   First MD Initiated Contact with Patient 04/03/13 1630     Chief Complaint  Patient presents with  . Fall   (Consider location/radiation/quality/duration/timing/severity/associated sxs/prior Treatment) HPI Comments: Pt is an 79 y/o female who tripped over an object in the floor last night and sustained a fall. She denies any LOC, unusual headache,  chest pain, or difficulty breathing.  Patient is a 79 y.o. female presenting with fall. The history is provided by the patient.  Fall This is a new problem. The current episode started yesterday. The problem has been gradually worsening. Associated symptoms include arthralgias and joint swelling. Pertinent negatives include no abdominal pain, chest pain, coughing, nausea, neck pain or vomiting. The symptoms are aggravated by standing and walking. She has tried nothing for the symptoms. The treatment provided no relief.    Past Medical History  Diagnosis Date  . Hyperlipemia   . Acid reflux   . Macular degeneration   . Constipation   . Anxiety   . Arthritis   . Vaginal atrophy    Past Surgical History  Procedure Laterality Date  . Knee arthroscopy  2010    left-[facility]  . Trigger finger release      right  . Hemorrhoid surgery    . Cataract surgery      bilaterally  . Abdominal hysterectomy    . Colonoscopy  04/30/2011    Procedure: COLONOSCOPY;  Surgeon: Malissa Hippo, MD;  Location: [facility];  Service: Endoscopy;  Laterality: N/A;  8:30 am  . Esophagogastroduodenoscopy  05/27/2011    Procedure: ESOPHAGOGASTRODUODENOSCOPY (EGD);  Surgeon: Malissa Hippo, MD;  Location: [facility];  Service: Endoscopy;  Laterality: N/A;  300  . Cervical disc surgery    . Cholecystectomy  01/26/2012    Procedure: LAPAROSCOPIC CHOLECYSTECTOMY;  Surgeon: [name], MD;  Location: [facility];  Service: General;  Laterality: N/A;   Family History  Problem Relation Age of  Onset  . Colon cancer Neg Hx   . Heart disease Mother   . Diabetes Mother   . Heart disease Father   . Diabetes Father    History  Substance Use Topics  . Smoking status: Never Smoker   . Smokeless tobacco: Never Used  . Alcohol Use: No   OB History   Grav Para Term Preterm Abortions TAB SAB Ect Mult Living   2 2             Review of Systems  Constitutional: Negative for activity change.       All ROS Neg except as noted in HPI  HENT: Negative for nosebleeds.   Eyes: Positive for visual disturbance. Negative for photophobia and discharge.  Respiratory: Negative for cough, shortness of breath and wheezing.   Cardiovascular: Negative for chest pain and palpitations.  Gastrointestinal: Positive for constipation. Negative for nausea, vomiting, abdominal pain and blood in stool.  Genitourinary: Negative for dysuria, frequency and hematuria.  Musculoskeletal: Positive for arthralgias and joint swelling. Negative for back pain and neck pain.  Skin: Negative.   Neurological: Negative for dizziness, seizures and speech difficulty.  Psychiatric/Behavioral: Negative for hallucinations and confusion. The patient is nervous/anxious.     Allergies  Review of patient's allergies indicates no known allergies.  Home Medications   Current Outpatient Rx  Name  Route  Sig  Dispense  Refill  . ALPRAZolam (XANAX) 0.5 MG tablet   Oral  Take 0.5 mg by mouth at bedtime. For sleep         . aspirin EC 81 MG EC tablet   Oral   Take 1 tablet (81 mg total) by mouth daily.   30 tablet   1   . cholecalciferol (VITAMIN D) 1000 UNITS tablet   Oral   Take 1,000 Units by mouth daily.         Marland Kitchen diltiazem (CARDIZEM CD) 180 MG 24 hr capsule   Oral   Take 180 mg by mouth daily.         Marland Kitchen docusate sodium (COLACE) 100 MG capsule   Oral   Take 200 mg by mouth every evening.         . metoprolol tartrate (LOPRESSOR) 25 MG tablet   Oral   Take 1 tablet (25 mg total) by mouth 2 (two)  times daily.   60 tablet   12   . Multiple Vitamins-Minerals (CENTRUM SILVER PO)   Oral   Take 1 tablet by mouth daily.           . Multiple Vitamins-Minerals (PRESERVISION AREDS 2 PO)   Oral   Take 1 capsule by mouth 2 (two) times daily.          . pantoprazole (PROTONIX) 40 MG tablet   Oral   Take 40 mg by mouth every morning.          . pravastatin (PRAVACHOL) 40 MG tablet   Oral   Take 40 mg by mouth at bedtime.           BP 155/58  Pulse 96  Temp(Src) 98.6 F (37 C) (Oral)  Resp 20  Ht 5' (1.524 m)  Wt 138 lb (62.596 kg)  BMI 26.95 kg/m2  SpO2 98% Physical Exam  Nursing note and vitals reviewed. Constitutional: She is oriented to person, place, and time. She appears well-developed and well-nourished.  Non-toxic appearance.  HENT:  Head: Normocephalic.  Right Ear: Tympanic membrane and external ear normal.  Left Ear: Tympanic membrane and external ear normal.  Eyes: EOM and lids are normal. Pupils are equal, round, and reactive to light.  Neck: Normal range of motion. Neck supple. Carotid bruit is not present.  Cardiovascular: Normal rate, regular rhythm, normal heart sounds, intact distal pulses and normal pulses.   Pulmonary/Chest: Breath sounds normal. No respiratory distress. She exhibits no tenderness and no bony tenderness.  Abdominal: Soft. Bowel sounds are normal. There is no tenderness. There is no guarding.  Musculoskeletal:       Right hip: She exhibits normal range of motion, normal strength, no bony tenderness and no swelling.       Left hip: She exhibits normal range of motion, normal strength, no tenderness and no swelling.       Left ankle: She exhibits decreased range of motion and swelling. Tenderness. Medial malleolus tenderness found.  Lymphadenopathy:       Head (right side): No submandibular adenopathy present.       Head (left side): No submandibular adenopathy present.    She has no cervical adenopathy.  Neurological: She is alert  and oriented to person, place, and time. She has normal strength. No cranial nerve deficit or sensory deficit.  Skin: Skin is warm and dry.  Psychiatric: She has a normal mood and affect. Her speech is normal.    ED Course  Procedures (including critical care time) Labs Review Labs Reviewed - No data to display Imaging Review Dg Ankle  Complete Left  04/03/2013   CLINICAL DATA:  Fall with left ankle pain and swelling.  EXAM: LEFT ANKLE COMPLETE - 3+ VIEW  COMPARISON:  None.  FINDINGS: Three views demonstrate lateral soft tissue swelling. There are subtle areas of lucency involving the distal fibula and there is a small cortical disruption along the lateral aspect of the fibula. Findings are suggestive for a nondisplaced fibular fracture. Ankle is located. Normal alignment of the ankle.  IMPRESSION: Nondisplaced fracture of the distal fibula.   Electronically Signed   By: Richarda Overlie M.D.   On: 04/03/2013 15:26    EKG Interpretation   None       MDM  No diagnosis found. **I have reviewed nursing notes, vital signs, and all appropriate lab and imaging results for this patient.* Pt sustained a nondisplaced fracture of the distal fibula. Pt placed in a walking boot. She is to see Dr Romeo Apple for evaluation and management. Pt states she does not want pain med other than tylenol or ibuprofen. Instructed on the need for elevation and orthopedic follow up. Pt acknowledges understanding.   Kathie Dike, PA-C 04/03/13 1723

## 2013-04-04 ENCOUNTER — Ambulatory Visit (INDEPENDENT_AMBULATORY_CARE_PROVIDER_SITE_OTHER): Payer: Medicare Other | Admitting: Orthopedic Surgery

## 2013-04-04 VITALS — BP 147/57 | Ht 62.0 in | Wt 138.0 lb

## 2013-04-04 DIAGNOSIS — S82409A Unspecified fracture of shaft of unspecified fibula, initial encounter for closed fracture: Secondary | ICD-10-CM

## 2013-04-04 NOTE — Progress Notes (Signed)
Patient ID: Marcia Woods, female   DOB: [birth year], 79 y.o.   MRN: [id number]  Chief Complaint  Patient presents with  . Ankle Pain    Fractured left Tib/Fib d/t injury 04/02/13    HISTORY:  This is A. an 79 year old female fractured her left fibula on Monday, October 13 fall down at home complains of dull 2/10 pain which is intermittent associated with bruising and swelling and worse when she's walking she is taking ibuprofen only intermittently  She is established patient with a new problem  She complains of occasional heart palpitations reflux joint pain swelling or muscle pain with otherwise normal review of systems she has a history of previous gallbladder surgery a partial hysterectomy knee surgery next surgery trigger finger surgery  History  Substance Use Topics  . Smoking status: Never Smoker   . Smokeless tobacco: Never Used  . Alcohol Use: No    Past Medical History  Diagnosis Date  . Hyperlipemia   . Acid reflux   . Macular degeneration   . Constipation   . Anxiety   . Arthritis   . Vaginal atrophy     Blood pressure 147/57, height 5\' 2"  (1.575 m), weight 138 lb (62.596 kg). General appearance is normal, the patient is alert and oriented x3 with normal mood and affect. 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

## 2013-04-04 NOTE — Patient Instructions (Signed)
Continue in boot for 6 weeks

## 2013-04-05 ENCOUNTER — Telehealth (INDEPENDENT_AMBULATORY_CARE_PROVIDER_SITE_OTHER): Payer: Self-pay | Admitting: *Deleted

## 2013-04-05 NOTE — Telephone Encounter (Signed)
Mrs. Plourde called me, she states that she took a fall fracturing her leg. She currently has an appointment with Dr.Rehman for 05/15/13. She is requesting to change that as it is very hard for her to get around, she was told it may take about 6 weeks to heal. She wants the appointment to still be with Dr.Rehman. Forwarded to Lupita Leash to cancel current appointment per patient and make another one with Dr.Rehman.

## 2013-04-05 NOTE — Telephone Encounter (Signed)
Apt has been rescheduled to 05/28/13 with Dr. Karilyn Cota.

## 2013-04-05 NOTE — ED Provider Notes (Signed)
Medical screening examination/treatment/procedure(s) were performed by non-physician practitioner and as supervising physician I was immediately available for consultation/collaboration.   Roney Marion, MD 04/05/13 [phone]

## 2013-04-17 ENCOUNTER — Telehealth: Payer: Self-pay | Admitting: Orthopedic Surgery

## 2013-04-17 NOTE — Telephone Encounter (Signed)
Marcia Woods asked if you will give her a prescription for Vicodin.  She didn't think she needed one but said Ibuprofen is not helping

## 2013-04-18 ENCOUNTER — Other Ambulatory Visit: Payer: Self-pay | Admitting: Orthopedic Surgery

## 2013-04-18 DIAGNOSIS — M25519 Pain in unspecified shoulder: Secondary | ICD-10-CM

## 2013-04-18 DIAGNOSIS — M1710 Unilateral primary osteoarthritis, unspecified knee: Secondary | ICD-10-CM

## 2013-04-18 DIAGNOSIS — IMO0002 Reserved for concepts with insufficient information to code with codable children: Secondary | ICD-10-CM

## 2013-04-18 MED ORDER — ACETAMINOPHEN-CODEINE #3 300-30 MG PO TABS: 1.0000 | ORAL_TABLET | ORAL | Status: DC | PRN

## 2013-04-18 NOTE — Telephone Encounter (Signed)
Can not do vicodin   Tylenol 3 only

## 2013-04-18 NOTE — Telephone Encounter (Signed)
Patient advised of the doctor's reply, aware to pick up prescription

## 2013-04-20 ENCOUNTER — Other Ambulatory Visit (HOSPITAL_COMMUNITY): Payer: Self-pay | Admitting: Internal Medicine

## 2013-04-20 DIAGNOSIS — Z09 Encounter for follow-up examination after completed treatment for conditions other than malignant neoplasm: Secondary | ICD-10-CM

## 2013-04-23 ENCOUNTER — Ambulatory Visit: Payer: Medicare Other | Admitting: Adult Health

## 2013-04-24 ENCOUNTER — Encounter: Payer: Self-pay | Admitting: Adult Health

## 2013-04-24 ENCOUNTER — Ambulatory Visit (INDEPENDENT_AMBULATORY_CARE_PROVIDER_SITE_OTHER): Payer: Medicare Other | Admitting: Adult Health

## 2013-04-24 VITALS — BP 145/90 | HR 84 | Ht 64.0 in | Wt 146.0 lb

## 2013-04-24 DIAGNOSIS — R002 Palpitations: Secondary | ICD-10-CM

## 2013-04-24 DIAGNOSIS — I471 Supraventricular tachycardia: Secondary | ICD-10-CM

## 2013-04-24 NOTE — Progress Notes (Deleted)
Name: Marcia Woods    DOB: 02-20-1926  Age: 79 y.o.  MR#: [id number]       PCP:  Carylon Perches, MD      Insurance: Payor: [employer] [place] [employer] / Plan: BLUE MEDICARE / Product Type: *No Product type* /   CC:    Chief Complaint  Patient presents with  . Tachycardia  . Atrial Flutter    VS Filed Vitals:   04/24/13 1437  BP: 145/90  Pulse: 84  Height: 5\' 4"  (1.626 m)  Weight: 146 lb (66.225 kg)    Weights Current Weight  04/24/13 146 lb (66.225 kg)  04/04/13 138 lb (62.596 kg)  04/03/13 138 lb (62.596 kg)    Blood Pressure  BP Readings from Last 3 Encounters:  04/24/13 145/90  04/04/13 147/57  04/03/13 143/69     Admit date:  (Not on file) Last encounter with RMR:  Visit date not found   Allergy Review of patient's allergies indicates no known allergies.  Current Outpatient Prescriptions  Medication Sig Dispense Refill  . acetaminophen-codeine (TYLENOL #3) 300-30 MG per tablet Take 1 tablet by mouth every 4 (four) hours as needed for pain.  60 tablet  5  . ALPRAZolam (XANAX) 0.5 MG tablet Take 0.25-0.5 mg by mouth at bedtime. For sleep      . aspirin EC 81 MG EC tablet Take 1 tablet (81 mg total) by mouth daily.  30 tablet  1  . cholecalciferol (VITAMIN D) 1000 UNITS tablet Take 1,000 Units by mouth daily.      Marland Kitchen diltiazem (CARDIZEM CD) 180 MG 24 hr capsule Take 240 mg by mouth daily.       Marland Kitchen docusate sodium (COLACE) 100 MG capsule Take 200 mg by mouth every evening.      Marland Kitchen ketotifen (THERA TEARS ALLERGY) 0.025 % ophthalmic solution Place 1 drop into both eyes daily as needed.      . metoprolol tartrate (LOPRESSOR) 25 MG tablet Take 1 tablet (25 mg total) by mouth 2 (two) times daily.  60 tablet  12  . Multiple Vitamins-Minerals (CENTRUM SILVER PO) Take 1 tablet by mouth daily.        . Multiple Vitamins-Minerals (PRESERVISION AREDS 2 PO) Take 1 capsule by mouth 2 (two) times daily.       . pantoprazole (PROTONIX) 40 MG tablet Take 40 mg by mouth every  morning.       . pravastatin (PRAVACHOL) 40 MG tablet Take 40 mg by mouth at bedtime.        No current facility-administered medications for this visit.    Discontinued Meds:   There are no discontinued medications.  Patient Active Problem List   Diagnosis Date Noted  . PSVT (paroxysmal supraventricular tachycardia) 03/05/2013  . Sinus tachycardia 03/03/2013  . Palpitations 03/03/2013  . Other and unspecified hyperlipidemia 03/03/2013  . GERD (gastroesophageal reflux disease) 03/03/2013  . Rectal bleeding 11/30/2012  . Abdominal bloating 11/20/2012  . Pelvic pressure in female 11/07/2012  . DERANGEMENT MENISCUS 04/16/2009  . KNEE, ARTHRITIS, DEGEN./OSTEO 11/20/2008  . KNEE PAIN 11/20/2008  . LOW BACK PAIN 10/30/2007  . LUMBAR SPASM 10/30/2007  . SHOULDER PAIN 09/14/2007  . IMPINGEMENT SYNDROME 09/14/2007    LABS    Component Value Date/Time   NA 140 03/06/2013 0527   NA 139 03/04/2013 0550   NA 138 03/03/2013 1231   K 3.9 03/06/2013 0527   K 3.6 03/04/2013 0550   K 4.0 03/03/2013 1231  CL 107 03/06/2013 0527   CL 106 03/04/2013 0550   CL 101 03/03/2013 1231   CO2 28 03/06/2013 0527   CO2 25 03/04/2013 0550   CO2 27 03/03/2013 1231   GLUCOSE 94 03/06/2013 0527   GLUCOSE 95 03/04/2013 0550   GLUCOSE 94 03/03/2013 1231   BUN 11 03/06/2013 0527   BUN 10 03/04/2013 0550   BUN 9 03/03/2013 1231   CREATININE 0.75 03/06/2013 0527   CREATININE 0.71 03/04/2013 0550   CREATININE 0.83 03/03/2013 1231   CALCIUM 9.6 03/06/2013 0527   CALCIUM 9.0 03/04/2013 0550   CALCIUM 10.0 03/03/2013 1231   GFRNONAA 76* 03/06/2013 0527   GFRNONAA 77* 03/04/2013 0550   GFRNONAA 63* 03/03/2013 1231   GFRAA 88* 03/06/2013 0527   GFRAA 89* 03/04/2013 0550   GFRAA 73* 03/03/2013 1231   CMP     Component Value Date/Time   NA 140 03/06/2013 0527   K 3.9 03/06/2013 0527   CL 107 03/06/2013 0527   CO2 28 03/06/2013 0527   GLUCOSE 94 03/06/2013 0527   BUN 11 03/06/2013 0527   CREATININE 0.75 03/06/2013 0527   CALCIUM  9.6 03/06/2013 0527   PROT 7.8 03/03/2013 1231   ALBUMIN 3.9 03/03/2013 1231   AST 31 03/03/2013 1231   ALT 18 03/03/2013 1231   ALKPHOS 68 03/03/2013 1231   BILITOT 0.4 03/03/2013 1231   GFRNONAA 76* 03/06/2013 0527   GFRAA 88* 03/06/2013 0527       Component Value Date/Time   WBC 6.7 03/04/2013 0550   WBC 4.6 03/03/2013 1231   WBC 4.8 03/02/2013 1156   HGB 13.6 03/04/2013 0550   HGB 14.7 03/03/2013 1231   HGB 14.7 03/02/2013 1156   HCT 40.9 03/04/2013 0550   HCT 44.5 03/03/2013 1231   HCT 44.4 03/02/2013 1156   MCV 92.3 03/04/2013 0550   MCV 91.8 03/03/2013 1231   MCV 91.9 03/02/2013 1156    Lipid Panel  No results found for this basename: chol, trig, hdl, cholhdl, vldl, ldlcalc    ABG No results found for this basename: phart, pco2, pco2art, po2, po2art, hco3, tco2, acidbasedef, o2sat     Lab Results  Component Value Date   TSH 1.264 03/03/2013   BNP (last 3 results) No results found for this basename: PROBNP,  in the last 8760 hours Cardiac Panel (last 3 results) No results found for this basename: CKTOTAL, CKMB, TROPONINI, RELINDX,  in the last 72 hours  Iron/TIBC/Ferritin No results found for this basename: iron, tibc, ferritin     EKG Orders placed during the hospital encounter of 03/03/13  . ED EKG  . ED EKG  . ED EKG  . ED EKG  . EKG 12-LEAD  . EKG 12-LEAD  . EKG 12-LEAD  . EKG 12-LEAD  . EKG 12-LEAD  . EKG 12-LEAD  . EKG 12-LEAD  . EKG  . EKG 12-LEAD     Prior Assessment and Plan Problem List as of 04/24/2013     Cardiovascular and Mediastinum   PSVT (paroxysmal supraventricular tachycardia)     Digestive   Rectal bleeding   GERD (gastroesophageal reflux disease)     Musculoskeletal and Integument   KNEE, ARTHRITIS, DEGEN./OSTEO   DERANGEMENT MENISCUS     Genitourinary   Pelvic pressure in female     Other   SHOULDER PAIN   KNEE PAIN   LOW BACK PAIN   LUMBAR SPASM   IMPINGEMENT SYNDROME   Abdominal bloating   Sinus tachycardia  Palpitations    Other and unspecified hyperlipidemia       Imaging: Dg Pelvis 1-2 Views  04/03/2013   CLINICAL DATA:  Recent fall.  EXAM: PELVIS - 1-2 VIEW  COMPARISON:  02/28/2013  FINDINGS: Single view of the pelvis was obtained. The pelvic bony ring is intact. Normal appearance of both hips. Symmetric appearance of the sacroiliac joints.  IMPRESSION: No acute bone abnormality to the pelvis.   Electronically Signed   By: Richarda Overlie M.D.   On: 04/03/2013 17:10   Dg Ankle Complete Left  04/03/2013   CLINICAL DATA:  Fall with left ankle pain and swelling.  EXAM: LEFT ANKLE COMPLETE - 3+ VIEW  COMPARISON:  None.  FINDINGS: Three views demonstrate lateral soft tissue swelling. There are subtle areas of lucency involving the distal fibula and there is a small cortical disruption along the lateral aspect of the fibula. Findings are suggestive for a nondisplaced fibular fracture. Ankle is located. Normal alignment of the ankle.  IMPRESSION: Nondisplaced fracture of the distal fibula.   Electronically Signed   By: Richarda Overlie M.D.   On: 04/03/2013 15:26

## 2013-04-24 NOTE — Assessment & Plan Note (Signed)
She is asymptomatic at this time. BP is well controlled despite use of both diltiazem and metoprolol, without symptoms of hypotension.

## 2013-04-24 NOTE — Assessment & Plan Note (Signed)
Heart rate is well controlled. She denies rapid rhythms at home. Continue dilt and metoprolol.

## 2013-04-24 NOTE — Patient Instructions (Signed)
Your physician recommends that you schedule a follow-up appointment in: 6 months You will receive a reminder letter two months in advance reminding you to call and schedule your appointment. If you don't receive this letter, please contact our office.  Your physician recommends that you continue on your current medications as directed. Please refer to the Current Medication list given to you today.     

## 2013-04-24 NOTE — Progress Notes (Signed)
HPI: Marcia Woods is a 79 year old patient of Dr. Diona Browner her following for ongoing assessment and management of atrial fibrillation and flutter, SVT , with recent hospitalization in September 2014. The patient was started on a Cardizem drip him and converted back to normal sinus rhythm. Echocardiogram was completed demonstrating normal LV function, with no significant valvular abnormalities. There was mild left atrial enlargement. The patient was asymptomatic. Was also noted on a CT scan to rule out PE that the patient had a focal groundglass attenuation airspace opacity in the posterior left lung. It was possible, that she may have and then no carcinoma. A followup CT scan was to be obtained in 6 weeks.    She was discharged on Lopressor, diltiazem 240 mg daily and aspirin. The dilt was increased by Dr.Fagan since discharge. She has had a fall recently, tripping in her, and fracturing her right foot. She is now in a walking cast.  She is without complaint of rapid HR or DOE. She medically compliant.   No Known Allergies  Current Outpatient Prescriptions  Medication Sig Dispense Refill  . acetaminophen-codeine (TYLENOL #3) 300-30 MG per tablet Take 1 tablet by mouth every 4 (four) hours as needed for pain.  60 tablet  5  . ALPRAZolam (XANAX) 0.5 MG tablet Take 0.25-0.5 mg by mouth at bedtime. For sleep      . aspirin EC 81 MG EC tablet Take 1 tablet (81 mg total) by mouth daily.  30 tablet  1  . cholecalciferol (VITAMIN D) 1000 UNITS tablet Take 1,000 Units by mouth daily.      Marland Kitchen diltiazem (CARDIZEM CD) 180 MG 24 hr capsule Take 240 mg by mouth daily.       Marland Kitchen docusate sodium (COLACE) 100 MG capsule Take 200 mg by mouth every evening.      Marland Kitchen ketotifen (THERA TEARS ALLERGY) 0.025 % ophthalmic solution Place 1 drop into both eyes daily as needed.      . metoprolol tartrate (LOPRESSOR) 25 MG tablet Take 1 tablet (25 mg total) by mouth 2 (two) times daily.  60 tablet  12  . Multiple  Vitamins-Minerals (CENTRUM SILVER PO) Take 1 tablet by mouth daily.        . Multiple Vitamins-Minerals (PRESERVISION AREDS 2 PO) Take 1 capsule by mouth 2 (two) times daily.       . pantoprazole (PROTONIX) 40 MG tablet Take 40 mg by mouth every morning.       . pravastatin (PRAVACHOL) 40 MG tablet Take 40 mg by mouth at bedtime.        No current facility-administered medications for this visit.    Past Medical History  Diagnosis Date  . Hyperlipemia   . Acid reflux   . Macular degeneration   . Constipation   . Anxiety   . Arthritis   . Vaginal atrophy     Past Surgical History  Procedure Laterality Date  . Knee arthroscopy  2010    left-[facility]  . Trigger finger release      right  . Hemorrhoid surgery    . Cataract surgery      bilaterally  . Abdominal hysterectomy    . Colonoscopy  04/30/2011    Procedure: COLONOSCOPY;  Surgeon: Malissa Hippo, MD;  Location[facility];  Service: Endoscopy;  Laterality: N/A;  8:30 am  . Esophagogastroduodenoscopy  05/27/2011    Procedure: ESOPHAGOGASTRODUODENOSCOPY (EGD);  Surgeon: Malissa Hippo, MD;  Location: [facility];  Service: Endoscopy;  Laterality: N/A;  300  . Cervical disc surgery    . Cholecystectomy  01/26/2012    Procedure: LAPAROSCOPIC CHOLECYSTECTOMY;  Surgeon: [name], MD;  Location: [facility];  Service: General;  Laterality: N/A;    ROS: Review of systems complete and found to be negative unless listed above PHYSICAL EXAM BP 145/90  Pulse 84  Ht 5\' 4"  (1.626 m)  Wt 146 lb (66.225 kg)  BMI 25.05 kg/m2  General: Well developed, well nourished, in no acute distress Head: Eyes PERRLA, No xanthomas.   Normal cephalic and atramatic  Lungs: Clear bilaterally to auscultation and percussion. Heart: HRIR S1 S2, 1.6 systolic murmur.  Pulses are 2+ & equal.            No carotid bruit. No JVD.  No abdominal bruits. No femoral bruits. Abdomen: Bowel sounds are positive, abdomen soft and non-tender without  masses or                  Hernia's noted. Msk:  Back normal, slow gait due to walking cast on right foot. Normal strength and tone for age. Extremities: No clubbing, cyanosis or edema.  DP +1 Neuro: Alert and oriented X 3. Psych:  Good affect, responds appropriately    ASSESSMENT AND PLAN

## 2013-05-03 ENCOUNTER — Encounter (HOSPITAL_COMMUNITY): Payer: Self-pay

## 2013-05-03 ENCOUNTER — Ambulatory Visit (HOSPITAL_COMMUNITY)
Admission: RE | Admit: 2013-05-03 | Discharge: 2013-05-03 | Disposition: A | Discharge status: Home / Self Care | Payer: Medicare Other | Source: Ambulatory Visit | Attending: Internal Medicine | Admitting: Internal Medicine

## 2013-05-03 DIAGNOSIS — J90 Pleural effusion, not elsewhere classified: Secondary | ICD-10-CM | POA: Insufficient documentation

## 2013-05-03 DIAGNOSIS — Z09 Encounter for follow-up examination after completed treatment for conditions other than malignant neoplasm: Secondary | ICD-10-CM

## 2013-05-03 DIAGNOSIS — M8448XA Pathological fracture, other site, initial encounter for fracture: Secondary | ICD-10-CM | POA: Insufficient documentation

## 2013-05-03 DIAGNOSIS — R911 Solitary pulmonary nodule: Secondary | ICD-10-CM | POA: Insufficient documentation

## 2013-05-03 DIAGNOSIS — J984 Other disorders of lung: Secondary | ICD-10-CM | POA: Insufficient documentation

## 2013-05-03 MED ORDER — IOHEXOL 300 MG/ML  SOLN
80.0000 mL | Freq: Once | INTRAMUSCULAR | Status: AC | PRN
  Administered 2013-05-03: 80 mL via INTRAVENOUS

## 2013-05-15 ENCOUNTER — Ambulatory Visit (INDEPENDENT_AMBULATORY_CARE_PROVIDER_SITE_OTHER): Payer: Medicare Other | Admitting: Internal Medicine

## 2013-05-22 ENCOUNTER — Telehealth (INDEPENDENT_AMBULATORY_CARE_PROVIDER_SITE_OTHER): Payer: Self-pay | Admitting: *Deleted

## 2013-05-22 ENCOUNTER — Ambulatory Visit (INDEPENDENT_AMBULATORY_CARE_PROVIDER_SITE_OTHER): Payer: Medicare Other

## 2013-05-22 ENCOUNTER — Ambulatory Visit (INDEPENDENT_AMBULATORY_CARE_PROVIDER_SITE_OTHER): Payer: Medicare Other | Admitting: Orthopedic Surgery

## 2013-05-22 ENCOUNTER — Encounter: Payer: Self-pay | Admitting: Orthopedic Surgery

## 2013-05-22 VITALS — BP 136/82 | Ht 62.0 in | Wt 138.0 lb

## 2013-05-22 DIAGNOSIS — M7552 Bursitis of left shoulder: Secondary | ICD-10-CM | POA: Insufficient documentation

## 2013-05-22 DIAGNOSIS — IMO0001 Reserved for inherently not codable concepts without codable children: Secondary | ICD-10-CM

## 2013-05-22 DIAGNOSIS — M67919 Unspecified disorder of synovium and tendon, unspecified shoulder: Secondary | ICD-10-CM

## 2013-05-22 DIAGNOSIS — S82892D Other fracture of left lower leg, subsequent encounter for closed fracture with routine healing: Secondary | ICD-10-CM

## 2013-05-22 DIAGNOSIS — S82899A Other fracture of unspecified lower leg, initial encounter for closed fracture: Secondary | ICD-10-CM

## 2013-05-22 HISTORY — DX: Other fracture of unspecified lower leg, initial encounter for closed fracture: S82.899A

## 2013-05-22 MED ORDER — HYDROCODONE-ACETAMINOPHEN 5-325 MG PO TABS: 1.0000 | ORAL_TABLET | Freq: Four times a day (QID) | ORAL | Status: DC | PRN

## 2013-05-22 NOTE — Patient Instructions (Signed)
Remove the brace  You have received a steroid shot. 15% of patients experience increased pain at the injection site with in the next 24 hours. This is best treated with ice and tylenol extra strength 2 tabs every 8 hours. If you are still having pain please call the office.

## 2013-05-22 NOTE — Progress Notes (Signed)
Patient ID: Marcia Woods, female   DOB: 06/03/1926, 79 y.o.   MRN: [id number]  Chief Complaint  Patient presents with  . Ankle Injury    Date of injury October 14 treated with walking brace  . Shoulder Pain    left shoulder pain aching at night     The patient presents for recheck of her left fibular fracture which was a Weber A. treated with a walking cam boot she's been doing well. Her x-ray shows healing. Her clinical exam shows mild tenderness and swelling with no loss of motion she should be able to remove that at this point  She's also complaining of new or recurrent left shoulder pain see separate note and dictation

## 2013-05-22 NOTE — Telephone Encounter (Signed)
Marcia Woods , left a message on my voicemail. She states a spot has been found on her lung, and she has an appointment with Dr. Tyrone Sage the same week that she has an appointment with Dr.Rehman ( 05/28/13). She is asking that this appointment be cancelled and make her another one with Dr.Rehman. She states that we can call her with that appointment or send her a letter ,either way. Forwarded to Lupita Leash to arrange.

## 2013-05-22 NOTE — Progress Notes (Signed)
  Chief Complaint  Patient presents with  . Ankle Injury    Date of injury October 14 treated with walking brace  . Shoulder Pain    left shoulder pain aching at night     Chief complaint of left shoulder pain times several weeks.  The patient reports previous history of bursitis in the left shoulder treated with injection presents now with new onset left shoulder pain including nonradiating aching pain over the left shoulder which is worse at night and associated with range of motion no weakness no trauma  Review of systems denies numbness tingling or weakness  Examination BP 136/82  Ht 5\' 2"  (1.575 m)  Wt 138 lb (62.596 kg)  BMI 25.23 kg/m2 General appearance is normal, the patient is alert and oriented x3 with normal mood and affect. Left shoulder tenderness in the area beneath the acromion posteriorly and around the bursal tissue of the deltoid area. She has painful range of motion in all planes but the shoulder remains stable there is no weakness in the rotator cuff the skin is intact has good distal pulse and normal sensation is no axillary adenopathy  Impression bursitis left shoulder  Recommend subacromial injection  Use Norco for pain as needed  Follow up as needed.  Subacromial Shoulder Injection Procedure Note  Pre-operative Diagnosis: left RC Syndrome  Post-operative Diagnosis: same  Indications: pain   Anesthesia: ethyl chloride   Procedure Details   Verbal consent was obtained for the procedure. The shoulder was prepped withalcohol and the skin was anesthetized. A 20 gauge needle was advanced into the subacromial space through posterior approach without difficulty  The space was then injected with 3 ml 1% lidocaine and 1 ml of depomedrol. The injection site was cleansed with isopropyl alcohol and a dressing was applied.  Complications:  None; patient tolerated the procedure well.

## 2013-05-24 ENCOUNTER — Other Ambulatory Visit: Payer: Self-pay

## 2013-05-24 ENCOUNTER — Institutional Professional Consult (permissible substitution) (INDEPENDENT_AMBULATORY_CARE_PROVIDER_SITE_OTHER): Payer: Medicare Other | Admitting: Cardiothoracic Surgery

## 2013-05-24 ENCOUNTER — Encounter: Payer: Self-pay | Admitting: Cardiothoracic Surgery

## 2013-05-24 VITALS — BP 138/96 | HR 67 | Resp 20 | Ht 62.0 in | Wt 138.0 lb

## 2013-05-24 DIAGNOSIS — R222 Localized swelling, mass and lump, trunk: Secondary | ICD-10-CM

## 2013-05-24 DIAGNOSIS — R918 Other nonspecific abnormal finding of lung field: Secondary | ICD-10-CM

## 2013-05-24 DIAGNOSIS — D381 Neoplasm of uncertain behavior of trachea, bronchus and lung: Secondary | ICD-10-CM

## 2013-05-24 NOTE — Progress Notes (Signed)
[address]       [place] [address]             [phone]                    DANIEL JOHNDROW [facility] Medical Record #[id number] Date of Birth: May 31, 1926  Referring: Carylon Perches, MD Primary Care: Carylon Perches, MD  Chief Complaint:    Chief Complaint  Patient presents with  . Lung Lesion    Surgical eval on Pulmonary nodule, Chest CT 05/03/13    History of Present Illness:    Marcia Woods 79 y.o. female is seen in the office  today for focal ill-defined area of ground-glass  opacity in the posterior left upper lobe abutting the major fissure  with air bronchograms within the area of ground-glass opacity. The  area of ground-glass measures approximately 3.9 x 1.7 cm which is unchanged from the exam in sept 2014.  In September of this year the patient developed a rapid heart rate he was seen for rapid atrial fibrillation, during that admission because of the elevated d-dimer and shortness of breath a CT scan of the chest to rule out pulmonary embolus was performed. A followup CT scan was performed 3 weeks ago it is similar in appearance.  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 She has no history of tuberculosis or known  tuberculosis exposure.    The patient is not on any anticoagulation, currently she is on aware of her heart rhythm but is again in atrial fibrillation in the office today.   Current Activity/ Functional Status:  Patient is independent with mobility/ambulation, transfers, ADL's, IADL's.  Zubrod Score: At the time of surgery this patient's most appropriate activity status/level should be described as: []  Normal activity, no symptoms [x]  Symptoms, fully ambulatory []  Symptoms, in bed less than or equal to 50% of the time []  Symptoms, in bed greater than 50% of the time but less than 100% []  Bedridden []  Moribund   Past Medical History  Diagnosis Date  .  Hyperlipemia   . Acid reflux   . Macular degeneration   . Constipation   . Anxiety   . Arthritis   . Vaginal atrophy   . PSVT (paroxysmal supraventricular tachycardia) 03/05/2013  . Ankle fracture 05/22/2013    Past Surgical History  Procedure Laterality Date  . Knee arthroscopy  2010    left-[facility]  . Trigger finger release      right  . Hemorrhoid surgery    . Cataract surgery      bilaterally  . Abdominal hysterectomy    . Colonoscopy  04/30/2011    Procedure: COLONOSCOPY;  Surgeon: Malissa Hippo, MD;  [facility];  Service: Endoscopy;  Laterality: N/A;  8:30 am  . Esophagogastroduodenoscopy  05/27/2011    Procedure: ESOPHAGOGASTRODUODENOSCOPY (EGD);  Surgeon: Malissa Hippo, MD;  [facility]: [facility];  Service: Endoscopy;  Laterality: N/A;  300  . Cervical disc surgery    . Cholecystectomy  01/26/2012    Procedure: LAPAROSCOPIC CHOLECYSTECTOMY;  Surgeon: [name], MD;  Location: [facility];  Service: General;  Laterality: N/A;    Family History  Problem Relation Age of Onset  . Colon cancer Neg Hx   . Heart disease Mother   . Diabetes Mother   . Heart disease Father   . Diabetes Father  History   Social History  . Marital Status: Widowed    Spouse Name: N/A    Number of Children: N/A  . Years of Education: N/A   Occupational History  . Not on file.   Social History Main Topics  . Smoking status: Never Smoker   . Smokeless tobacco: Never Used  . Alcohol Use: No  . Drug Use: No  . Sexual Activity: No          Social History Narrative  .  patient lives alone , she does not drive and never has     History  Smoking status  . Never Smoker   Smokeless tobacco  . Never Used    History  Alcohol Use No     Allergies  Allergen Reactions  . Codeine Nausea And Vomiting    Current Outpatient Prescriptions  Medication Sig Dispense Refill  . ALPRAZolam (XANAX) 0.5 MG tablet Take 0.25-0.5 mg by mouth at bedtime. For sleep       . aspirin EC 81 MG EC tablet Take 1 tablet (81 mg total) by mouth daily.  30 tablet  1  . cholecalciferol (VITAMIN D) 1000 UNITS tablet Take 1,000 Units by mouth daily.      Marland Kitchen diltiazem (CARDIZEM CD) 180 MG 24 hr capsule Take 240 mg by mouth daily.       Marland Kitchen docusate sodium (COLACE) 100 MG capsule Take 200 mg by mouth every evening.      Marland Kitchen ketotifen (THERA TEARS ALLERGY) 0.025 % ophthalmic solution Place 1 drop into both eyes daily as needed.      . metoprolol tartrate (LOPRESSOR) 25 MG tablet Take 1 tablet (25 mg total) by mouth 2 (two) times daily.  60 tablet  12  . Multiple Vitamins-Minerals (PRESERVISION AREDS 2 PO) Take 1 capsule by mouth 2 (two) times daily.       . pantoprazole (PROTONIX) 40 MG tablet Take 40 mg by mouth every morning.       . pravastatin (PRAVACHOL) 40 MG tablet Take 40 mg by mouth at bedtime.        No current facility-administered medications for this visit.       Review of Systems:     Cardiac Review of Systems: Y or N  Chest Pain [  n  ]  Resting SOB [ n  ] Exertional SOB  Cove.Etienne  ]  Orthopnea [ y ]   Pedal Edema [y left leg   ]    Palpitations [ y ] Syncope  [ n ]   Presyncope [n   ]  General Review of Systems: [Y] = yes [  ]=no Constitional: recent weight change [  ];  Wt loss over the last 3 months [ none  ] anorexia [  ]; fatigue [  ]; nausea [  ]; night sweats [  ]; fever [  ]; or chills [  ];          Dental: poor dentition[  ]; Last Dentist visit:   Eye : blurred vision [  ]; diplopia [   ]; vision changes [  ];  Amaurosis fugax[  ]; Resp: cough [  ];  wheezing[  ];  hemoptysis[  ]; shortness of breath[  ]; paroxysmal nocturnal dyspnea[  ]; dyspnea on exertion[ y ]; or orthopnea[  ];  GI:  gallstones[removed  ], vomiting[  ];  dysphagia[  ]; melena[  ];  hematochezia [  ]; heartburn[ y ];   Hx  of  Colonoscopy[  ]; GU: kidney stones [  ]; hematuria[  ];   dysuria [  ];  nocturia[  ];  history of     obstruction [ n ]; urinary frequency [  ]             Skin:  rash, swelling[  ];, hair loss[  ];  peripheral edema[  ];  or itching[  ]; Musculosketetal: myalgias[  ];  joint swelling[  ];  joint erythema[  ];  joint pain[  ];  back pain[  ];  Heme/Lymph: bruising[  ];  bleeding[  ];  anemia[  ];  Neuro: TIA[  ];  headaches[  ];  stroke[  ];  vertigo[  ];  seizures[  ];   paresthesias[  ];  difficulty walking[ n ];  Psych:depression[n  ]; anxiety[ n ];  Endocrine: diabetes[  ];  thyroid dysfunction[  ];  Immunizations: Flu up to date [ y ]; Pneumococcal up to date Cove.Etienne  ];  Other:  Physical Exam: BP 138/96  Pulse 67  Resp 20  Ht 5\' 2"  (1.575 m)  Wt 138 lb (62.596 kg)  BMI 25.23 kg/m2  SpO2 98%    General appearance: alert, cooperative and appears stated age Neurologic: intact Heart: irregularly irregular rhythm Lungs: clear to auscultation bilaterally Abdomen: soft, non-tender; bowel sounds normal; no masses,  no organomegaly Extremities: edema Patient has edema of the left ankle which has recently been removed from the cast secondary to ankle fracture and Homans sign is negative, no sign of DVT Patient has no cervical supraclavicular adenopathy no carotid bruits   Diagnostic Studies & Laboratory data:     Recent Radiology Findings:   Dg Ankle Complete Left  05/22/2013   3 views left ankle  Left ankle fracture  Previous x-rays compared  There is a distal fibular fracture transverse consistent with a Weber A  type injury  Fracture line is resolving, no displacement is noted. Ankle mortise is  intact  Impression healing Weber A fracture  Ct Chest W Contrast  05/03/2013   CLINICAL DATA:  Follow ground-glass opacity in the posterior left lung. No chest complaints.  EXAM: CT CHEST WITH CONTRAST  TECHNIQUE: Multidetector CT imaging of the chest was performed during intravenous contrast administration.  CONTRAST:  80mL OMNIPAQUE IOHEXOL 300 MG/ML  SOLN  COMPARISON:  03/04/2013.  FINDINGS: The central airways are patent. There is biapical scarring.  There is a stable 4 mm noncalcified right upper lobe pulmonary nodule (image 12/series 3). There is a focal ill-defined area of ground-glass opacity in the posterior left upper lobe abutting the major fissure with air bronchograms within the area of ground-glass opacity. The area of ground-glass measures approximately 3.9 x 1.7 cm which is unchanged from the prior exam. There is no pneumothorax. There are bilateral small pleural effusions.  There are no pathologically enlarged axillary, hilar or mediastinal lymph nodes.  The heart size is normal. There is no pericardial effusion. The thoracic aorta is normal in caliber.  Review of bone windows demonstrates no focal lytic or sclerotic lesions. There is a chronic L1 compression fracture. .  Limited non-contrast images of the upper abdomen were obtained. The adrenal glands appear normal. The remainder of the visualized abdominal organs are unremarkable.  IMPRESSION: 1. Stable focal area of ground-glass measuring 3.9 x 1.7 cm in the posterior left upper lobe, unchanged compared with 03/04/2013. This may be secondary to an infectious or inflammatory etiology. Adenocarcinoma in situ could present  in a similar fashion. Recommend thoracic surgery consultation.  2. There is a nonspecific 4 mm subpleural pulmonary nodule in the right upper lobe. If the patient is at high risk for bronchogenic carcinoma, follow-up chest CT at 1year is recommended. If the patient is at low risk, no follow-up is needed. This recommendation follows the consensus statement: Guidelines for Management of Small Pulmonary Nodules Detected on CT Scans: A Statement from the Fleischner Society as published in Radiology 2005; 237:395-400.   Electronically Signed   By: Elige Ko   On: 05/03/2013 13:28      Recent Lab Findings: Lab Results  Component Value Date   WBC 6.7 03/04/2013   HGB 13.6 03/04/2013   HCT 40.9 03/04/2013   PLT 189 03/04/2013   GLUCOSE 94 03/06/2013   ALT 18 03/03/2013   AST 31  03/03/2013   NA 140 03/06/2013   K 3.9 03/06/2013   CL 107 03/06/2013   CREATININE 1.10 05/03/2013   BUN 11 03/06/2013   CO2 28 03/06/2013   TSH 1.264 03/03/2013   INR 0.95 03/03/2013      Assessment / Plan:   focal ill-defined area of ground-glass opacity in the posterior left upper lobe abutting the major fissure approximately 3.9 x 1.7 cm- possible slow-growing adenocarcinoma versus inflammatory process Hyperlipemia;  Acid reflux;  Macular degeneration;   PSVT (paroxysmal supraventricular tachycardia) (03/05/2013);   Ankle fracture (05/22/2013). recent  I discussed with the patient her family possible diagnosis of the left upper lobe lesion, carcinoma versus inflammatory process. I recommended that we proceed with further evaluation including PET scan and pulmonary function studies. The patient is again in atrial fibrillation she will need followup cardiology consultation and cardiology clearance potential surgical resection of the lung lesion. I'll see the patient back in approximately one week after the PET scan and PFTs are completed to decide on further evaluation, with needle biopsy versus endoscopic navigation bronchoscopy with biopsy.   I spent 40 minutes counseling the patient face to face. The total time spent in the appointment was 60 minutes.  Delight Ovens MD      [address] [place] [address] Office [phone]   Beeper [phone]  05/24/2013 1:50 PM

## 2013-05-28 ENCOUNTER — Ambulatory Visit (INDEPENDENT_AMBULATORY_CARE_PROVIDER_SITE_OTHER): Payer: Medicare Other | Admitting: Internal Medicine

## 2013-05-28 ENCOUNTER — Telehealth: Payer: Self-pay | Admitting: *Deleted

## 2013-05-28 NOTE — Telephone Encounter (Signed)
At the current time I do not have any information regarding this surgical clearance request. I have called Tanya and asked her to have this information re-sent if possible. Marcia Woods was just seen in the office by Ms. Lawrence NP in November. I reviewed that note, no comment on pending surgery or clearance at that time.

## 2013-05-28 NOTE — Telephone Encounter (Signed)
Please advise 

## 2013-05-28 NOTE — Telephone Encounter (Signed)
Calling to see if surgical clearance was given for patient. States that a staff message was sent to Dr Diona Browner on 05/24/13. Dr Blima Dessert left her pager number (above) if Dr Diona Browner would like to speak to her.

## 2013-06-01 ENCOUNTER — Ambulatory Visit (INDEPENDENT_AMBULATORY_CARE_PROVIDER_SITE_OTHER): Payer: Medicare Other | Admitting: Cardiology

## 2013-06-01 ENCOUNTER — Encounter: Payer: Self-pay | Admitting: Cardiology

## 2013-06-01 VITALS — BP 114/64 | HR 78 | Ht 60.0 in | Wt 146.0 lb

## 2013-06-01 DIAGNOSIS — I4891 Unspecified atrial fibrillation: Secondary | ICD-10-CM

## 2013-06-01 DIAGNOSIS — I4821 Permanent atrial fibrillation: Secondary | ICD-10-CM | POA: Insufficient documentation

## 2013-06-01 DIAGNOSIS — Z01810 Encounter for preprocedural cardiovascular examination: Secondary | ICD-10-CM

## 2013-06-01 MED ORDER — METOPROLOL TARTRATE 25 MG PO TABS: 37.5000 mg | ORAL_TABLET | Freq: Two times a day (BID) | ORAL | Status: DC

## 2013-06-01 NOTE — Progress Notes (Signed)
Clinical Summary Ms. Balow is an 79 y.o.female presenting for preoperative consultation. I saw her as an inpatient cardiology consult back in September with PSVT. Rhythm at that time appeared to be atypical atrial flutter, although just as likely would be atrial tachycardia or perhaps even a reentrant long RP tachycardia. She was treated with rate control regimen and aspirin, had followup with Ms. Lawrence NP recently in November at which time she was symptomatically well controlled.  Echocardiogram in September of this year revealed mild basal septal hypertrophy with LVEF 60-65%, indeterminate diastolic dysfunction however evidence of elevated left atrial pressure was suggested, mild left atrial enlargement, no major valvular abnormalities.  Concurrently, she has been undergoing evaluation for an abnormal chest CT demonstrating a focal, ill-defined area of ground glass opacity in the posterior left upper lobe associated with air bronchograms. She saw Dr. Tyrone Sage on December 4 with concern for possibility of a slow-growing adenocarcinoma versus inflammatory process. She is being referred for further assessment via PET scan as well as PFTs, and ultimately undergo biopsy, possibly even resection. It is stated in this encounter that the patient was in atrial fibrillation at that time, no ECG available to confirm this, heart rate listed at 67 in the vital signs. Cardiac exam at that time describes an irregularly irregular rhythm.  She describes feeling a mild sense of palpitations at times, no progressive shortness of breath or chest pain. She reports compliance with her current medications. Just saw Dr. Ouida Sills earlier today. She has not yet undergone pulmonary testing, scheduled for next week.  ECG confirms atrial fibrillation today, low voltage, nonspecific ST changes. We reviewed arrhythmia. CHADSVASC score is 3 at this point. We discussed management strategies including rate control, antiarrhythmic  treatments, also anticoagulation. Plan is formulated below.   Allergies  Allergen Reactions  . Codeine Nausea And Vomiting    Current Outpatient Prescriptions  Medication Sig Dispense Refill  . ALPRAZolam (XANAX) 0.5 MG tablet Take 0.25-0.5 mg by mouth at bedtime. For sleep      . aspirin EC 81 MG EC tablet Take 1 tablet (81 mg total) by mouth daily.  30 tablet  1  . cholecalciferol (VITAMIN D) 1000 UNITS tablet Take 1,000 Units by mouth daily.      Marland Kitchen diltiazem (CARDIZEM CD) 180 MG 24 hr capsule Take 360 mg by mouth daily.       Marland Kitchen docusate sodium (COLACE) 100 MG capsule Take 200 mg by mouth every evening.      Marland Kitchen ketotifen (THERA TEARS ALLERGY) 0.025 % ophthalmic solution Place 1 drop into both eyes daily as needed.      . pantoprazole (PROTONIX) 40 MG tablet Take 40 mg by mouth every morning.       . pravastatin (PRAVACHOL) 40 MG tablet Take 40 mg by mouth at bedtime.       . metoprolol tartrate (LOPRESSOR) 25 MG tablet Take 1.5 tablets (37.5 mg total) by mouth 2 (two) times daily.  270 tablet  3   No current facility-administered medications for this visit.    Past Medical History  Diagnosis Date  . Hyperlipemia   . Acid reflux   . Macular degeneration   . Constipation   . Anxiety   . Arthritis   . Vaginal atrophy   . PSVT (paroxysmal supraventricular tachycardia) 03/05/2013  . Ankle fracture 05/22/2013    Past Surgical History  Procedure Laterality Date  . Knee arthroscopy  2010    left-[facility]  . Trigger finger release  right  . Hemorrhoid surgery    . Cataract surgery      bilaterally  . Abdominal hysterectomy    . Colonoscopy  04/30/2011    Procedure: COLONOSCOPY;  Surgeon: Malissa Hippo, MD;  [facility];  Service: Endoscopy;  Laterality: N/A;  8:30 am  . Esophagogastroduodenoscopy  05/27/2011    Procedure: ESOPHAGOGASTRODUODENOSCOPY (EGD);  Surgeon: Malissa Hippo, MD;  [facility];  Service: Endoscopy;  Laterality: N/A;  300  .  Cervical disc surgery    . Cholecystectomy  01/26/2012    Procedure: LAPAROSCOPIC CHOLECYSTECTOMY;  Surgeon: [name], MD;  Location: [facility];  Service: General;  Laterality: N/A;    Family History  Problem Relation Age of Onset  . Colon cancer Neg Hx   . Heart disease Mother   . Diabetes Mother   . Heart disease Father   . Diabetes Father     Social History Ms. Frysinger reports that she has never smoked. She has never used smokeless tobacco. Ms. Hajduk reports that she does not drink alcohol.  Review of Systems Negative except as outlined.  Physical Examination Filed Vitals:   06/01/13 1348  BP: 114/64  Pulse: 78   Filed Weights   06/01/13 1348  Weight: 146 lb (66.225 kg)   Patient appears comfortable at rest. HEENT: Conjunctiva and lids normal, oropharynx clear. Neck: Supple, no elevated JVP or carotid bruits, no thyromegaly. Lungs: Diminished breath sounds but clear to auscultation, nonlabored breathing at rest. Cardiac: Irregularly irregular, no S3, no pericardial rub. Abdomen: Soft, nontender,bowel sounds present, no guarding or rebound. Extremities: No pitting edema, distal pulses 1-2+. Skin: Warm and dry. Musculoskeletal: No kyphosis. Neuropsychiatric: Alert and oriented x3, affect grossly appropriate.   Problem List and Plan   Atrial fibrillation Possibly paroxysmal to persistent, absolute duration is uncertain. This is documented by ECG today in clinic.CHADSVASC score is 3. No indication for cardioversion or initiation of antiarrhythmic at this time. Lopressor dose will be increased to 37.5 mg twice daily, could be titrated further as necessary. Otherwise continue current dose of Cardizem CD. She will stay on aspirin for now in light of pending pulmonary workup which may include biopsy or even resection surgery in the near future. When these issues are clarified and she is otherwise stable, we can discuss anticoagulant treatments in lieu of  aspirin.  Preoperative cardiovascular examination Patient with atrial fibrillation as noted above, no definitive history of CAD, myocardial infarction, or cardiomyopathy. LVEF 60-65% by recent assessment. From a cardiac perspective, she should be able to proceed with potential lung biopsy or even wedge resection at an acceptable perioperative cardiac risk. Will obviously hold off on anticoagulation for atrial fibrillation in this setting, can be discussed later. She will stay on aspirin for now.    Jonelle Sidle, M.D., F.A.C.C.

## 2013-06-01 NOTE — Patient Instructions (Addendum)
Your physician recommends that you schedule a follow-up appointment in: ONE MONTH  Your physician has recommended you make the following change in your medication:   1) INCREASE LOPRESSOR TO 37.5MG  TWICE DAILY (ONE TABLET AND A HALF, TWICE DAILY)

## 2013-06-01 NOTE — Assessment & Plan Note (Signed)
Possibly paroxysmal to persistent, absolute duration is uncertain. This is documented by ECG today in clinic.CHADSVASC score is 3. No indication for cardioversion or initiation of antiarrhythmic at this time. Lopressor dose will be increased to 37.5 mg twice daily, could be titrated further as necessary. Otherwise continue current dose of Cardizem CD. She will stay on aspirin for now in light of pending pulmonary workup which may include biopsy or even resection surgery in the near future. When these issues are clarified and she is otherwise stable, we can discuss anticoagulant treatments in lieu of aspirin.

## 2013-06-01 NOTE — Assessment & Plan Note (Signed)
Patient with atrial fibrillation as noted above, no definitive history of CAD, myocardial infarction, or cardiomyopathy. LVEF 60-65% by recent assessment. From a cardiac perspective, she should be able to proceed with potential lung biopsy or even wedge resection at an acceptable perioperative cardiac risk. Will obviously hold off on anticoagulation for atrial fibrillation in this setting, can be discussed later. She will stay on aspirin for now.

## 2013-06-06 ENCOUNTER — Ambulatory Visit (HOSPITAL_COMMUNITY)
Admission: RE | Admit: 2013-06-06 | Discharge: 2013-06-06 | Disposition: A | Discharge status: Home / Self Care | Payer: Medicare Other | Source: Ambulatory Visit | Attending: Cardiothoracic Surgery | Admitting: Cardiothoracic Surgery

## 2013-06-06 ENCOUNTER — Encounter (HOSPITAL_COMMUNITY)
Admission: RE | Admit: 2013-06-06 | Discharge: 2013-06-06 | Disposition: A | Discharge status: Home / Self Care | Payer: Medicare Other | Source: Ambulatory Visit | Attending: Cardiothoracic Surgery | Admitting: Cardiothoracic Surgery

## 2013-06-06 ENCOUNTER — Encounter (HOSPITAL_COMMUNITY): Payer: Self-pay

## 2013-06-06 DIAGNOSIS — D381 Neoplasm of uncertain behavior of trachea, bronchus and lung: Secondary | ICD-10-CM | POA: Insufficient documentation

## 2013-06-06 LAB — PULMONARY FUNCTION TEST
DL/VA % pred: 85 %
DL/VA: 4.01 ml/min/mmHg/L
DLCO cor % pred: 57 %
DLCO cor: 13.17 ml/min/mmHg
DLCO unc % pred: 57 %
DLCO unc: 13.17 ml/min/mmHg
FEF 25-75 Pre: 1.14 L/sec
FEF2575-%Pred-Pre: 104 %
FEV1-%Pred-Pre: 101 %
FEV1-Pre: 1.69 L
FEV1FVC-%Pred-Pre: 98 %
FEV6-%Pred-Pre: 110 %
FEV6-Pre: 2.33 L
FEV6FVC-%Pred-Pre: 105 %
FVC-%Pred-Pre: 103 %
FVC-Pre: 2.35 L
Pre FEV1/FVC ratio: 72 %
Pre FEV6/FVC Ratio: 99 %
RV % pred: 110 %
RV: 2.69 L
TLC % pred: 101 %
TLC: 5 L

## 2013-06-06 LAB — GLUCOSE, CAPILLARY: Glucose-Capillary: 92 mg/dL (ref 70–99)

## 2013-06-06 MED ORDER — FLUDEOXYGLUCOSE F - 18 (FDG) INJECTION
20.6000 | Freq: Once | INTRAVENOUS | Status: AC | PRN
  Administered 2013-06-06: 20.6 via INTRAVENOUS

## 2013-06-07 ENCOUNTER — Other Ambulatory Visit: Payer: Self-pay | Admitting: *Deleted

## 2013-06-07 ENCOUNTER — Ambulatory Visit (INDEPENDENT_AMBULATORY_CARE_PROVIDER_SITE_OTHER): Payer: Medicare Other | Admitting: Cardiothoracic Surgery

## 2013-06-07 ENCOUNTER — Encounter: Payer: Self-pay | Admitting: Cardiothoracic Surgery

## 2013-06-07 VITALS — BP 144/93 | HR 100 | Resp 18 | Ht 60.0 in | Wt 146.0 lb

## 2013-06-07 DIAGNOSIS — R911 Solitary pulmonary nodule: Secondary | ICD-10-CM

## 2013-06-07 NOTE — Progress Notes (Signed)
[address]       [place] [address]             [phone]                      [name] [facility] Medical Record #[id number] Date of Birth: 03-11-1926  Referring: Carylon Perches, MD Primary Care: Carylon Perches, MD  Chief Complaint:    Chief Complaint  Patient presents with  . Lung Lesion    F/U discuss PFT's aand PET Scan    History of Present Illness:    Marcia Woods 79 y.o. female is seen in the office  today for focal ill-defined area of ground-glass  opacity in the posterior left upper lobe abutting the major fissure  with air bronchograms within the area of ground-glass opacity. The  area of ground-glass measures approximately 3.9 x 1.7 cm which is unchanged from the exam in sept 2014.  In September of this year the patient developed a rapid heart rate he was seen for rapid atrial fibrillation, during that admission because of the elevated d-dimer and shortness of breath a CT scan of the chest to rule out pulmonary embolus was performed. A followup CT scan was performed 3 weeks ago it is similar in appearance.  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 She has no history of tuberculosis or known  tuberculosis exposure.    The patient is not on any anticoagulation, currently she is on aware of her heart rhythm but is again in atrial fibrillation in the office today. She was seen by cardiology last week,   Current Activity/ Functional Status:  Patient is independent with mobility/ambulation, transfers, ADL's, IADL's.  Zubrod Score: At the time of surgery this patient's most appropriate activity status/level should be described as: []  Normal activity, no symptoms [x]  Symptoms, fully ambulatory []  Symptoms, in bed less than or equal to 50% of the time []  Symptoms, in bed greater than 50% of the time but less than 100% []  Bedridden []  Moribund   Past Medical History    Diagnosis Date  . Hyperlipemia   . Acid reflux   . Macular degeneration   . Constipation   . Anxiety   . Arthritis   . Vaginal atrophy   . PSVT (paroxysmal supraventricular tachycardia) 03/05/2013  . Ankle fracture 05/22/2013    Past Surgical History  Procedure Laterality Date  . Knee arthroscopy  2010    left-[facility]  . Trigger finger release      right  . Hemorrhoid surgery    . Cataract surgery      bilaterally  . Abdominal hysterectomy    . Colonoscopy  04/30/2011    Procedure: COLONOSCOPY;  Surgeon: Malissa Hippo, MD;  Location: [facility];  Service: Endoscopy;  Laterality: N/A;  8:30 am  . Esophagogastroduodenoscopy  05/27/2011    Procedure: ESOPHAGOGASTRODUODENOSCOPY (EGD);  Surgeon: Malissa Hippo, MD;  Location: [facility];  Service: Endoscopy;  Laterality: N/A;  300  . Cervical disc surgery    . Cholecystectomy  01/26/2012    Procedure: LAPAROSCOPIC CHOLECYSTECTOMY;  Surgeon: [name], MD;  Location: [facility] ORS;  Service: General;  Laterality: N/A;    Family History  Problem Relation Age of Onset  . Colon cancer Neg Hx   . Heart disease Mother   . Diabetes Mother   .  Heart disease Father   . Diabetes Father     History   Social History  . Marital Status: Widowed    Spouse Name: N/A    Number of Children: N/A  . Years of Education: N/A   Occupational History  . Not on file.   Social History Main Topics  . Smoking status: Never Smoker   . Smokeless tobacco: Never Used  . Alcohol Use: No  . Drug Use: No  . Sexual Activity: No          Social History Narrative  .  patient lives alone , she does not drive and never has     History  Smoking status  . Never Smoker   Smokeless tobacco  . Never Used    History  Alcohol Use No     Allergies  Allergen Reactions  . Codeine Nausea And Vomiting    Current Outpatient Prescriptions  Medication Sig Dispense Refill  . ALPRAZolam (XANAX) 0.5 MG tablet Take 0.25-0.5 mg by mouth at  bedtime. For sleep      . aspirin EC 81 MG EC tablet Take 1 tablet (81 mg total) by mouth daily.  30 tablet  1  . cholecalciferol (VITAMIN D) 1000 UNITS tablet Take 1,000 Units by mouth daily.      Marland Kitchen diltiazem (CARDIZEM CD) 180 MG 24 hr capsule Take 360 mg by mouth daily.       Marland Kitchen docusate sodium (COLACE) 100 MG capsule Take 200 mg by mouth every evening.      Marland Kitchen ketotifen (THERA TEARS ALLERGY) 0.025 % ophthalmic solution Place 1 drop into both eyes daily as needed.      . metoprolol tartrate (LOPRESSOR) 25 MG tablet Take 1.5 tablets (37.5 mg total) by mouth 2 (two) times daily.  270 tablet  3  . pantoprazole (PROTONIX) 40 MG tablet Take 40 mg by mouth every morning.       . pravastatin (PRAVACHOL) 40 MG tablet Take 40 mg by mouth at bedtime.        No current facility-administered medications for this visit.       Review of Systems:     Cardiac Review of Systems: Y or N  Chest Pain [  n  ]  Resting SOB [ n  ] Exertional SOB  Cove.Etienne  ]  Orthopnea [ y ]   Pedal Edema [y left leg   ]    Palpitations [ y ] Syncope  [ n ]   Presyncope [n   ]  General Review of Systems: [Y] = yes [  ]=no Constitional: recent weight change [  ];  Wt loss over the last 3 months [ none  ] anorexia [  ]; fatigue [  ]; nausea [  ]; night sweats [  ]; fever [  ]; or chills [  ];          Dental: poor dentition[  ]; Last Dentist visit:   Eye : blurred vision [  ]; diplopia [   ]; vision changes [  ];  Amaurosis fugax[  ]; Resp: cough [  ];  wheezing[  ];  hemoptysis[  ]; shortness of breath[  ]; paroxysmal nocturnal dyspnea[  ]; dyspnea on exertion[ y ]; or orthopnea[  ];  GI:  gallstones[removed  ], vomiting[  ];  dysphagia[  ]; melena[  ];  hematochezia [  ]; heartburn[ y ];   Hx of  Colonoscopy[  ]; GU: kidney stones [  ];  hematuria[  ];   dysuria [  ];  nocturia[  ];  history of     obstruction [ n ]; urinary frequency [  ]             Skin: rash, swelling[  ];, hair loss[  ];  peripheral edema[  ];  or itching[   ]; Musculosketetal: myalgias[  ];  joint swelling[  ];  joint erythema[  ];  joint pain[  ];  back pain[  ];  Heme/Lymph: bruising[  ];  bleeding[  ];  anemia[  ];  Neuro: TIA[  ];  headaches[  ];  stroke[  ];  vertigo[  ];  seizures[  ];   paresthesias[  ];  difficulty walking[ n ];  Psych:depression[n  ]; anxiety[ n ];  Endocrine: diabetes[  ];  thyroid dysfunction[  ];  Immunizations: Flu up to date [ y ]; Pneumococcal up to date Cove.Etienne  ];  Other:  Physical Exam: BP 144/93  Pulse 100  Resp 18  Ht 5' (1.524 m)  Wt 146 lb (66.225 kg)  BMI 28.51 kg/m2  SpO2 98%  General appearance: alert, cooperative and appears stated age Neurologic: intact Heart: irregularly irregular rhythm Lungs: clear to auscultation bilaterally Abdomen: soft, non-tender; bowel sounds normal; no masses,  no organomegaly Extremities: edema Patient has edema of the left ankle which has recently been removed from the cast secondary to ankle fracture and Homans sign is negative, no sign of DVT Patient has no cervical supraclavicular adenopathy no carotid bruits   Diagnostic Studies & Laboratory data:      Recent Radiology Findings: Nm Pet Image Initial (pi) Skull Base To Thigh  06/06/2013   CLINICAL DATA:  Initial treatment strategy for lung nodule.  EXAM: NUCLEAR MEDICINE PET SKULL BASE TO THIGH  FASTING BLOOD GLUCOSE:  Value: 92mg /dl  TECHNIQUE: 16.1 mCi W-96 FDG was injected intravenously. CT data was obtained and used for attenuation correction and anatomic localization only. (This was not acquired as a diagnostic CT examination.) Additional exam technical data entered on technologist worksheet.  COMPARISON:  Chest CT 05/03/2013.  FINDINGS: NECK  No hypermetabolic lymph nodes in the neck.  CHEST  4.1 x 1.9 cm ground-glass attenuation mass like opacity in the posterior aspect of the left upper lobe with internal air bronchograms appears similar to prior studies. This lesion demonstrates diffuse low-level  hypermetabolism (SUVmax = 2.1). No other suspicious appearing pulmonary nodules or masses are otherwise identified. Linear opacities in the lingula, right middle lobe and lower lobes of the lungs bilaterally are most compatible with areas of mild chronic scarring. No hypermetabolic mediastinal or hilar nodes. Heart size is mildly enlarged. There is no significant pericardial fluid, thickening or pericardial calcification. There is atherosclerosis of the thoracic aorta, the great vessels of the mediastinum and the coronary arteries, including calcified atherosclerotic plaque in the left anterior descending coronary arteries. Small hiatal hernia.  ABDOMEN/PELVIS  No abnormal hypermetabolic activity within the liver, pancreas, adrenal glands, or spleen. No hypermetabolic lymph nodes in the abdomen or pelvis. Multiple small calcifications scattered throughout the liver compatible with granulomas. Status post cholecystectomy. Normal appendix. Status post hysterectomy. Ovaries are not confidently identified may be surgically absent or atrophic.  SKELETON  No focal hypermetabolic activity to suggest skeletal metastasis. Orthopedic fixation hardware in the lower cervical spine incidentally noted.  IMPRESSION: 1. 4.1 x 1.9 cm ground-glass attenuation mass like opacity in the posterior aspect of the left upper lobe demonstrates low level hypermetabolism, concerning for  potential adenocarcinoma. Correlation with biopsy is recommended if clinically appropriate. 2. Additional incidental findings, as above.   Electronically Signed   By: Trudie Reed M.D.   On: 06/06/2013 14:04     Dg Ankle Complete Left  05/22/2013   3 views left ankle  Left ankle fracture  Previous x-rays compared  There is a distal fibular fracture transverse consistent with a Weber A  type injury  Fracture line is resolving, no displacement is noted. Ankle mortise is  intact  Impression healing Weber A fracture  Ct Chest W Contrast  05/03/2013    CLINICAL DATA:  Follow ground-glass opacity in the posterior left lung. No chest complaints.  EXAM: CT CHEST WITH CONTRAST  TECHNIQUE: Multidetector CT imaging of the chest was performed during intravenous contrast administration.  CONTRAST:  80mL OMNIPAQUE IOHEXOL 300 MG/ML  SOLN  COMPARISON:  03/04/2013.  FINDINGS: The central airways are patent. There is biapical scarring. There is a stable 4 mm noncalcified right upper lobe pulmonary nodule (image 12/series 3). There is a focal ill-defined area of ground-glass opacity in the posterior left upper lobe abutting the major fissure with air bronchograms within the area of ground-glass opacity. The area of ground-glass measures approximately 3.9 x 1.7 cm which is unchanged from the prior exam. There is no pneumothorax. There are bilateral small pleural effusions.  There are no pathologically enlarged axillary, hilar or mediastinal lymph nodes.  The heart size is normal. There is no pericardial effusion. The thoracic aorta is normal in caliber.  Review of bone windows demonstrates no focal lytic or sclerotic lesions. There is a chronic L1 compression fracture. .  Limited non-contrast images of the upper abdomen were obtained. The adrenal glands appear normal. The remainder of the visualized abdominal organs are unremarkable.  IMPRESSION: 1. Stable focal area of ground-glass measuring 3.9 x 1.7 cm in the posterior left upper lobe, unchanged compared with 03/04/2013. This may be secondary to an infectious or inflammatory etiology. Adenocarcinoma in situ could present in a similar fashion. Recommend thoracic surgery consultation.  2. There is a nonspecific 4 mm subpleural pulmonary nodule in the right upper lobe. If the patient is at high risk for bronchogenic carcinoma, follow-up chest CT at 1year is recommended. If the patient is at low risk, no follow-up is needed. This recommendation follows the consensus statement: Guidelines for Management of Small Pulmonary Nodules  Detected on CT Scans: A Statement from the Fleischner Society as published in Radiology 2005; 237:395-400.   Electronically Signed   By: Elige Ko   On: 05/03/2013 13:28      Recent Lab Findings: Lab Results  Component Value Date   WBC 6.7 03/04/2013   HGB 13.6 03/04/2013   HCT 40.9 03/04/2013   PLT 189 03/04/2013   GLUCOSE 94 03/06/2013   ALT 18 03/03/2013   AST 31 03/03/2013   NA 140 03/06/2013   K 3.9 03/06/2013   CL 107 03/06/2013   CREATININE 1.10 05/03/2013   BUN 11 03/06/2013   CO2 28 03/06/2013   TSH 1.264 03/03/2013   INR 0.95 03/03/2013   PFT'S- FEV1 1.6  101%  DLCO 57%   Assessment / Plan:   focal ill-defined area of ground-glass opacity in the posterior left upper lobe abutting the major fissure approximately 3.9 x 1.7 cm- possible slow-growing adenocarcinoma versus inflammatory process Hyperlipemia;  Acid reflux;  Macular degeneration;   PSVT (paroxysmal supraventricular tachycardia) (03/05/2013);   Ankle fracture (05/22/2013). recent  I discussed with the patient her  family possible diagnosis of the left upper lobe lesion, carcinoma versus inflammatory process. I recommended that we proceed with further evaluation including bronchoscopy with ENB, EBUS and Biopsy. Will plan for dec 22  Delight Ovens MD      [address] [place] [place] [address] Office [phone]   Beeper [phone]  06/07/2013 9:37 PM

## 2013-06-08 ENCOUNTER — Encounter (HOSPITAL_COMMUNITY)
Admission: RE | Admit: 2013-06-08 | Discharge: 2013-06-08 | Disposition: A | Discharge status: Home / Self Care | Payer: Medicare Other | Source: Ambulatory Visit | Attending: Cardiothoracic Surgery | Admitting: Cardiothoracic Surgery

## 2013-06-08 ENCOUNTER — Encounter (HOSPITAL_COMMUNITY): Payer: Self-pay | Admitting: Pharmacy Technician

## 2013-06-08 ENCOUNTER — Encounter (HOSPITAL_COMMUNITY): Payer: Self-pay

## 2013-06-08 VITALS — BP 126/89 | HR 98 | Temp 97.8°F | Resp 14 | Ht 63.0 in | Wt 148.1 lb

## 2013-06-08 DIAGNOSIS — R911 Solitary pulmonary nodule: Secondary | ICD-10-CM

## 2013-06-08 LAB — COMPREHENSIVE METABOLIC PANEL
ALT: 30 U/L (ref 0–35)
AST: 27 U/L (ref 0–37)
Albumin: 3.8 g/dL (ref 3.5–5.2)
Alkaline Phosphatase: 83 U/L (ref 39–117)
BUN: 12 mg/dL (ref 6–23)
CO2: 28 mEq/L (ref 19–32)
Calcium: 9.4 mg/dL (ref 8.4–10.5)
Chloride: 103 mEq/L (ref 96–112)
Creatinine, Ser: 0.96 mg/dL (ref 0.50–1.10)
GFR calc Af Amer: 61 mL/min — ABNORMAL LOW (ref >90)
GFR calc non Af Amer: 52 mL/min — ABNORMAL LOW (ref >90)
Glucose, Bld: 90 mg/dL (ref 70–99)
Potassium: 5 mEq/L (ref 3.5–5.1)
Sodium: 139 mEq/L (ref 135–145)
Total Bilirubin: 0.3 mg/dL (ref 0.3–1.2)
Total Protein: 7.3 g/dL (ref 6.0–8.3)

## 2013-06-08 LAB — CBC
HCT: 40.3 % (ref 36.0–46.0)
Hemoglobin: 13.3 g/dL (ref 12.0–15.0)
MCH: 30.2 pg (ref 26.0–34.0)
MCHC: 33 g/dL (ref 30.0–36.0)
MCV: 91.6 fL (ref 78.0–100.0)
Platelets: 184 10*3/uL (ref 150–400)
RBC: 4.4 MIL/uL (ref 3.87–5.11)
RDW: 13.5 % (ref 11.5–15.5)
WBC: 5.8 10*3/uL (ref 4.0–10.5)

## 2013-06-08 LAB — PROTIME-INR
INR: 0.98 (ref 0.00–1.49)
Prothrombin Time: 12.8 seconds (ref 11.6–15.2)

## 2013-06-08 LAB — APTT: aPTT: 31 seconds (ref 24–37)

## 2013-06-08 NOTE — Progress Notes (Signed)
Anesthesia Chart Review:  Patient is a 79 year old female scheduled for video bronchoscopy with endobronchial navigation on 06/11/13 by Dr. Tyrone Sage.  History includes newly diagnosed afib/flutter in 02/2013 with work-up revealing an incidental LUL lung mass, non-smoker, GERD, hiatal hernia, macular degeneration, HLD, anxiety, constipation, cholecystectomy, cervical disc surgery.  PCP is listed as Dr. Carylon Perches.  Cardiologist Dr. Willey Blade.  According to his 06/01/13 office note, "From a cardiac perspective, she should be able to proceed with potential lung biopsy or even wedge resection at an acceptable perioperative cardiac risk. Will obviously hold off on anticoagulation for atrial fibrillation in this setting, can be discussed later. She will stay on aspirin for now."  EKG on 06/01/13 showed: atrial fibrillation at 103 bpm, low voltage, nonspecific ST changes.   HR was 98 bpm today.  She is taking Lopressor and Cardizem.  Echo on 03/02/13 showed: - Left ventricle: The cavity size was normal. There was mild focal basal hypertrophy with no obstruction. Systolic function was normal. The estimated ejection fraction was in the range of 60% to 65%. Wall motion was normal; there were no regional wall motion abnormalities. The study is not technically sufficient to allow evaluation of LV diastolic function. There is evidence of elevated left atrial pressure. - Aortic valve: Valve area: 1.71cm^2(VTI). - Left atrium: The atrium was mildly dilated. - Trivial pulmonic and tricuspid regurgitation.  CXR on 06/08/13 showed: There is hyperinflation consistent with COPD. No definite acute cardiopulmonary abnormality is demonstrated on this study, but there is known pulmonary parenchymal abnormality in the left upper lobe posteriorly.   Pre-BD PFTS on 06/06/13 showed an FVC 2.35 (103%), FEV1 1.69 (101%), DLCO 13.17 (57%).  Preoperative labs noted.  If no acute changes then I anticipate that she can  proceed as planned.  Velna Ochs [facility] Short Stay Center/Anesthesiology Phone [phone] 06/08/2013 4:19 PM

## 2013-06-08 NOTE — Pre-Procedure Instructions (Addendum)
[facility]  06/08/2013   Your procedure is scheduled on:  06/11/13  Report to [facility] short stay admitting at 530 AM.  Call this number if you have problems the morning of surgery: [phone]   Remember:   Do not eat food or drink liquids after midnight.   Take these medicines the morning of surgery with A SIP OF WATER: cardizem,metoprolol, protonix              STOP all herbel meds, nsaids (aleve,naproxen,advil,ibuprofen) now   Do not wear jewelry, make-up or nail polish.  Do not wear lotions, powders, or perfumes. You may wear deodorant.  Do not shave 48 hours prior to surgery. Men may shave face and neck.  Do not bring valuables to the hospital.  [facility] is not responsible                  for any belongings or valuables.               Contacts, dentures or bridgework may not be worn into surgery.  Leave suitcase in the car. After surgery it may be brought to your room.  For patients admitted to the hospital, discharge time is determined by your                treatment team.               Patients discharged the day of surgery will not be allowed to drive  home.  Name and phone number of your driver:   Special Instructions: Shower using CHG 2 nights before surgery and the night before surgery.  If you shower the day of surgery use CHG.  Use special wash - you have one bottle of CHG for all showers.  You should use approximately 1/3 of the bottle for each shower.   Please read over the following fact sheets that you were given: Pain Booklet, Coughing and Deep Breathing and Surgical Site Infection Prevention

## 2013-06-11 ENCOUNTER — Encounter (HOSPITAL_COMMUNITY): Payer: Medicare Other | Admitting: Vascular Surgery

## 2013-06-11 ENCOUNTER — Ambulatory Visit (HOSPITAL_COMMUNITY): Payer: Medicare Other

## 2013-06-11 ENCOUNTER — Encounter (HOSPITAL_COMMUNITY)
Admission: RE | Disposition: A | Discharge status: Home / Self Care | Payer: Self-pay | Source: Ambulatory Visit | Attending: Cardiothoracic Surgery

## 2013-06-11 ENCOUNTER — Encounter (HOSPITAL_COMMUNITY): Payer: Self-pay | Admitting: *Deleted

## 2013-06-11 ENCOUNTER — Ambulatory Visit (HOSPITAL_COMMUNITY)
Admission: RE | Admit: 2013-06-11 | Discharge: 2013-06-11 | Disposition: A | Discharge status: Home / Self Care | Payer: Medicare Other | Source: Ambulatory Visit | Attending: Cardiothoracic Surgery | Admitting: Cardiothoracic Surgery

## 2013-06-11 ENCOUNTER — Ambulatory Visit (HOSPITAL_COMMUNITY): Payer: Medicare Other | Admitting: Anesthesiology

## 2013-06-11 DIAGNOSIS — Z01818 Encounter for other preprocedural examination: Secondary | ICD-10-CM | POA: Insufficient documentation

## 2013-06-11 DIAGNOSIS — I4891 Unspecified atrial fibrillation: Secondary | ICD-10-CM | POA: Insufficient documentation

## 2013-06-11 DIAGNOSIS — E785 Hyperlipidemia, unspecified: Secondary | ICD-10-CM | POA: Insufficient documentation

## 2013-06-11 DIAGNOSIS — H3530 Unspecified macular degeneration: Secondary | ICD-10-CM | POA: Insufficient documentation

## 2013-06-11 DIAGNOSIS — Z01812 Encounter for preprocedural laboratory examination: Secondary | ICD-10-CM | POA: Insufficient documentation

## 2013-06-11 DIAGNOSIS — K219 Gastro-esophageal reflux disease without esophagitis: Secondary | ICD-10-CM | POA: Insufficient documentation

## 2013-06-11 DIAGNOSIS — R911 Solitary pulmonary nodule: Secondary | ICD-10-CM

## 2013-06-11 DIAGNOSIS — D381 Neoplasm of uncertain behavior of trachea, bronchus and lung: Secondary | ICD-10-CM

## 2013-06-11 DIAGNOSIS — R222 Localized swelling, mass and lump, trunk: Secondary | ICD-10-CM | POA: Insufficient documentation

## 2013-06-11 HISTORY — PX: ENDOBRONCHIAL ULTRASOUND: SHX5096

## 2013-06-11 HISTORY — PX: BRONCHIAL BIOPSY: SHX5109

## 2013-06-11 HISTORY — PX: VIDEO BRONCHOSCOPY WITH ENDOBRONCHIAL NAVIGATION: SHX6175

## 2013-06-11 SURGERY — VIDEO BRONCHOSCOPY WITH ENDOBRONCHIAL NAVIGATION
Anesthesia: General

## 2013-06-11 MED ORDER — METOCLOPRAMIDE HCL 5 MG/ML IJ SOLN
5.0000 mg | Freq: Once | INTRAMUSCULAR | Status: AC
  Administered 2013-06-11: 5 mg via INTRAVENOUS

## 2013-06-11 MED ORDER — PROPOFOL 10 MG/ML IV BOLUS
INTRAVENOUS | Status: DC | PRN
  Administered 2013-06-11: 150 mg via INTRAVENOUS

## 2013-06-11 MED ORDER — LIDOCAINE HCL (CARDIAC) 20 MG/ML IV SOLN
INTRAVENOUS | Status: DC | PRN
  Administered 2013-06-11: 40 mg via INTRAVENOUS

## 2013-06-11 MED ORDER — 0.9 % SODIUM CHLORIDE (POUR BTL) OPTIME
TOPICAL | Status: DC | PRN
  Administered 2013-06-11: 1000 mL

## 2013-06-11 MED ORDER — NEOSTIGMINE METHYLSULFATE 1 MG/ML IJ SOLN
INTRAMUSCULAR | Status: DC | PRN
  Administered 2013-06-11: 4 mg via INTRAVENOUS

## 2013-06-11 MED ORDER — ROCURONIUM BROMIDE 100 MG/10ML IV SOLN
INTRAVENOUS | Status: DC | PRN
  Administered 2013-06-11: 40 mg via INTRAVENOUS
  Administered 2013-06-11: 5 mg via INTRAVENOUS
  Administered 2013-06-11 (×2): 10 mg via INTRAVENOUS

## 2013-06-11 MED ORDER — LACTATED RINGERS IV SOLN
INTRAVENOUS | Status: DC | PRN
  Administered 2013-06-11: 07:00:00 via INTRAVENOUS

## 2013-06-11 MED ORDER — ONDANSETRON HCL 4 MG/2ML IJ SOLN
INTRAMUSCULAR | Status: DC | PRN
  Administered 2013-06-11: 4 mg via INTRAVENOUS

## 2013-06-11 MED ORDER — METOCLOPRAMIDE HCL 5 MG/ML IJ SOLN
INTRAMUSCULAR | Status: AC
  Filled 2013-06-11: qty 2

## 2013-06-11 MED ORDER — GLYCOPYRROLATE 0.2 MG/ML IJ SOLN
INTRAMUSCULAR | Status: DC | PRN
  Administered 2013-06-11: .4 mg via INTRAVENOUS

## 2013-06-11 MED ORDER — PHENYLEPHRINE HCL 10 MG/ML IJ SOLN
10.0000 mg | INTRAVENOUS | Status: DC | PRN
  Administered 2013-06-11: 30 ug/min via INTRAVENOUS

## 2013-06-11 MED ORDER — FENTANYL CITRATE 0.05 MG/ML IJ SOLN: 25.0000 ug | INTRAMUSCULAR | Status: DC | PRN

## 2013-06-11 MED ORDER — FENTANYL CITRATE 0.05 MG/ML IJ SOLN
INTRAMUSCULAR | Status: DC | PRN
  Administered 2013-06-11: 100 ug via INTRAVENOUS

## 2013-06-11 SURGICAL SUPPLY — 29 items
BRUSH SUPERTRAX BIOPSY (INSTRUMENTS) IMPLANT
BRUSH SUPERTRAX NDL-TIP CYTO (INSTRUMENTS) ×2 IMPLANT
CANISTER SUCTION 2500CC (MISCELLANEOUS) ×2 IMPLANT
CHANNEL WORK EXTEND EDGE 180 (KITS) IMPLANT
CHANNEL WORK EXTEND EDGE 45 (KITS) IMPLANT
CHANNEL WORK EXTEND EDGE 90 (KITS) ×2 IMPLANT
CONT SPEC 4OZ CLIKSEAL STRL BL (MISCELLANEOUS) ×4 IMPLANT
COVER TABLE BACK 60X90 (DRAPES) ×2 IMPLANT
DRSG AQUACEL AG ADV 3.5X14 (GAUZE/BANDAGES/DRESSINGS) ×2 IMPLANT
FILTER STRAW FLUID ASPIR (MISCELLANEOUS) IMPLANT
FORCEPS BIOP SUPERTRX PREMAR (INSTRUMENTS) ×2 IMPLANT
GLOVE BIO SURGEON STRL SZ 6.5 (GLOVE) ×2 IMPLANT
KIT PROCEDURE EDGE 180 (KITS) IMPLANT
KIT PROCEDURE EDGE 45 (KITS) ×2 IMPLANT
KIT PROCEDURE EDGE 90 (KITS) IMPLANT
KIT ROOM TURNOVER OR (KITS) ×2 IMPLANT
MARKER SKIN DUAL TIP RULER LAB (MISCELLANEOUS) ×2 IMPLANT
NEEDLE SUPERTRX PREMARK BIOPSY (NEEDLE) ×2 IMPLANT
NEEDLE SYS SONOTIP II EBUSTBNA (NEEDLE) ×2 IMPLANT
NS IRRIG 1000ML POUR BTL (IV SOLUTION) ×2 IMPLANT
OIL SILICONE PENTAX (PARTS (SERVICE/REPAIRS)) ×2 IMPLANT
PAD ARMBOARD 7.5X6 YLW CONV (MISCELLANEOUS) ×4 IMPLANT
PATCHES PATIENT (LABEL) ×6 IMPLANT
SPONGE GAUZE 4X4 12PLY (GAUZE/BANDAGES/DRESSINGS) ×2 IMPLANT
SYR 20CC LL (SYRINGE) IMPLANT
SYR 20ML ECCENTRIC (SYRINGE) ×2 IMPLANT
TOWEL OR 17X24 6PK STRL BLUE (TOWEL DISPOSABLE) ×2 IMPLANT
TRAP SPECIMEN MUCOUS 40CC (MISCELLANEOUS) ×2 IMPLANT
TUBE CONNECTING 12X1/4 (SUCTIONS) ×2 IMPLANT

## 2013-06-11 NOTE — Transfer of Care (Signed)
Immediate Anesthesia Transfer of Care Note  Patient: Marcia Woods  Procedure(s) Performed: Procedure(s): VIDEO BRONCHOSCOPY WITH ENDOBRONCHIAL NAVIGATION (N/A) ENDOBRONCHIAL ULTRASOUND (N/A) TRANSBRONCHIAL BIOPSIES (N/A)  Patient Location: PACU  Anesthesia Type:General  Level of Consciousness: awake, alert , oriented and patient cooperative  Airway & Oxygen Therapy: Patient Spontanous Breathing and Patient connected to face mask oxygen  Post-op Assessment: Report given to PACU RN, Post -op Vital signs reviewed and stable, Patient moving all extremities, Patient moving all extremities X 4 and Patient able to stick tongue midline  Post vital signs: Reviewed and stable  Complications: No apparent anesthesia complications

## 2013-06-11 NOTE — H&P (Signed)
[address]       [place] [address]             [phone]                        Marcia Woods [facility] Medical Record #[id number] Date of Birth: 1925-12-30  Referring: Dr Ouida Sills Primary Care: Carylon Perches, MD  Chief Complaint:    Lung Mass  History of Present Illness:    Marcia Woods 79 y.o. female is seen in the office  today for focal ill-defined area of ground-glass  opacity in the posterior left upper lobe abutting the major fissure  with air bronchograms within the area of ground-glass opacity. The  area of ground-glass measures approximately 3.9 x 1.7 cm which is unchanged from the exam in sept 2014.  In September of this year the patient developed a rapid heart rate he was seen for rapid atrial fibrillation, during that admission because of the elevated d-dimer and shortness of breath a CT scan of the chest to rule out pulmonary embolus was performed. A followup CT scan was performed 3 weeks ago it is similar in appearance.  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 She has no history of tuberculosis or known  tuberculosis exposure.    The patient is not on any anticoagulation, currently she is on aware of her heart rhythm but is again in atrial fibrillation in the office today. She was seen by cardiology last week,   Current Activity/ Functional Status:  Patient is independent with mobility/ambulation, transfers, ADL's, IADL's.  Zubrod Score: At the time of surgery this patient's most appropriate activity status/level should be described as: []  Normal activity, no symptoms [x]  Symptoms, fully ambulatory []  Symptoms, in bed less than or equal to 50% of the time []  Symptoms, in bed greater than 50% of the time but less than 100% []  Bedridden []  Moribund   Past Medical History  Diagnosis Date  . Hyperlipemia   . Acid reflux   . Macular degeneration   . Constipation     . Anxiety   . Arthritis   . Vaginal atrophy   . PSVT (paroxysmal supraventricular tachycardia) 03/05/2013  . Ankle fracture 05/22/2013  . Shortness of breath     occ  . H/O hiatal hernia     Past Surgical History  Procedure Laterality Date  . Knee arthroscopy  2010    left-[facility]  . Trigger finger release      right  . Hemorrhoid surgery    . Cataract surgery      bilaterally  . Abdominal hysterectomy    . Colonoscopy  04/30/2011    Procedure: COLONOSCOPY;  Surgeon: Malissa Hippo, MD;  [facility];  Service: Endoscopy;  Laterality: N/A;  8:30 am  . Esophagogastroduodenoscopy  05/27/2011    Procedure: ESOPHAGOGASTRODUODENOSCOPY (EGD);  Surgeon: Malissa Hippo, MD;  Location: [facility];  Service: Endoscopy;  Laterality: N/A;  300  . Cervical disc surgery    . Cholecystectomy  01/26/2012    Procedure: LAPAROSCOPIC CHOLECYSTECTOMY;  Surgeon: [name], MD;  Location: [facility] ORS;  Service: General;  Laterality: N/A;    Family History  Problem Relation Age of Onset  . Colon cancer Neg Hx   . Heart disease Mother   . Diabetes Mother   . Heart disease Father   .  Diabetes Father     History   Social History  . Marital Status: Widowed    Spouse Name: N/A    Number of Children: N/A  . Years of Education: N/A   Occupational History  . Not on file.   Social History Main Topics  . Smoking status: Never Smoker   . Smokeless tobacco: Never Used  . Alcohol Use: No  . Drug Use: No  . Sexual Activity: No          Social History Narrative  .  patient lives alone , she does not drive and never has     History  Smoking status  . Never Smoker   Smokeless tobacco  . Never Used    History  Alcohol Use No     Allergies  Allergen Reactions  . Codeine Nausea And Vomiting    No current facility-administered medications for this encounter.   Facility-Administered Medications Ordered in Other Encounters  Medication Dose Route Frequency Provider  Last Rate Last Dose  . lactated ringers infusion    Continuous PRN Coralee Rud, CRNA           Review of Systems:     Cardiac Review of Systems: Y or N  Chest Pain [  n  ]  Resting SOB [ n  ] Exertional SOB  Cove.Etienne  ]  Orthopnea [ y ]   Pedal Edema [y left leg   ]    Palpitations [ y ] Syncope  [ n ]   Presyncope [n   ]  General Review of Systems: [Y] = yes [  ]=no Constitional: recent weight change [  ];  Wt loss over the last 3 months [ none  ] anorexia [  ]; fatigue [  ]; nausea [  ]; night sweats [  ]; fever [  ]; or chills [  ];          Dental: poor dentition[  ]; Last Dentist visit:   Eye : blurred vision [  ]; diplopia [   ]; vision changes [  ];  Amaurosis fugax[  ]; Resp: cough [  ];  wheezing[  ];  hemoptysis[  ]; shortness of breath[  ]; paroxysmal nocturnal dyspnea[  ]; dyspnea on exertion[ y ]; or orthopnea[  ];  GI:  gallstones[removed  ], vomiting[  ];  dysphagia[  ]; melena[  ];  hematochezia [  ]; heartburn[ y ];   Hx of  Colonoscopy[  ]; GU: kidney stones [  ]; hematuria[  ];   dysuria [  ];  nocturia[  ];  history of     obstruction [ n ]; urinary frequency [  ]             Skin: rash, swelling[  ];, hair loss[  ];  peripheral edema[  ];  or itching[  ]; Musculosketetal: myalgias[  ];  joint swelling[  ];  joint erythema[  ];  joint pain[  ];  back pain[  ];  Heme/Lymph: bruising[  ];  bleeding[  ];  anemia[  ];  Neuro: TIA[  ];  headaches[  ];  stroke[  ];  vertigo[  ];  seizures[  ];   paresthesias[  ];  difficulty walking[ n ];  Psych:depression[n  ]; anxiety[ n ];  Endocrine: diabetes[  ];  thyroid dysfunction[  ];  Immunizations: Flu up to date [ y ]; Pneumococcal up to date Cove.Etienne  ];  Other:  Physical Exam:  BP 141/95  Pulse 96  Temp(Src) 97.6 F (36.4 C) (Oral)  Resp 16  SpO2 99%  General appearance: alert, cooperative and appears stated age Neurologic: intact Heart: irregularly irregular rhythm Lungs: clear to auscultation bilaterally Abdomen: soft,  non-tender; bowel sounds normal; no masses,  no organomegaly Extremities: edema Patient has edema of the left ankle which has recently been removed from the cast secondary to ankle fracture and Homans sign is negative, no sign of DVT Patient has no cervical supraclavicular adenopathy no carotid bruits   Diagnostic Studies & Laboratory data:      Recent Radiology Findings: Nm Pet Image Initial (pi) Skull Base To Thigh  06/06/2013   CLINICAL DATA:  Initial treatment strategy for lung nodule.  EXAM: NUCLEAR MEDICINE PET SKULL BASE TO THIGH  FASTING BLOOD GLUCOSE:  Value: 92mg /dl  TECHNIQUE: 40.9 mCi W-11 FDG was injected intravenously. CT data was obtained and used for attenuation correction and anatomic localization only. (This was not acquired as a diagnostic CT examination.) Additional exam technical data entered on technologist worksheet.  COMPARISON:  Chest CT 05/03/2013.  FINDINGS: NECK  No hypermetabolic lymph nodes in the neck.  CHEST  4.1 x 1.9 cm ground-glass attenuation mass like opacity in the posterior aspect of the left upper lobe with internal air bronchograms appears similar to prior studies. This lesion demonstrates diffuse low-level hypermetabolism (SUVmax = 2.1). No other suspicious appearing pulmonary nodules or masses are otherwise identified. Linear opacities in the lingula, right middle lobe and lower lobes of the lungs bilaterally are most compatible with areas of mild chronic scarring. No hypermetabolic mediastinal or hilar nodes. Heart size is mildly enlarged. There is no significant pericardial fluid, thickening or pericardial calcification. There is atherosclerosis of the thoracic aorta, the great vessels of the mediastinum and the coronary arteries, including calcified atherosclerotic plaque in the left anterior descending coronary arteries. Small hiatal hernia.  ABDOMEN/PELVIS  No abnormal hypermetabolic activity within the liver, pancreas, adrenal glands, or spleen. No  hypermetabolic lymph nodes in the abdomen or pelvis. Multiple small calcifications scattered throughout the liver compatible with granulomas. Status post cholecystectomy. Normal appendix. Status post hysterectomy. Ovaries are not confidently identified may be surgically absent or atrophic.  SKELETON  No focal hypermetabolic activity to suggest skeletal metastasis. Orthopedic fixation hardware in the lower cervical spine incidentally noted.  IMPRESSION: 1. 4.1 x 1.9 cm ground-glass attenuation mass like opacity in the posterior aspect of the left upper lobe demonstrates low level hypermetabolism, concerning for potential adenocarcinoma. Correlation with biopsy is recommended if clinically appropriate. 2. Additional incidental findings, as above.   Electronically Signed   By: Trudie Reed M.D.   On: 06/06/2013 14:04     Dg Ankle Complete Left  05/22/2013   3 views left ankle  Left ankle fracture  Previous x-rays compared  There is a distal fibular fracture transverse consistent with a Weber A  type injury  Fracture line is resolving, no displacement is noted. Ankle mortise is  intact  Impression healing Weber A fracture  Ct Chest W Contrast  05/03/2013   CLINICAL DATA:  Follow ground-glass opacity in the posterior left lung. No chest complaints.  EXAM: CT CHEST WITH CONTRAST  TECHNIQUE: Multidetector CT imaging of the chest was performed during intravenous contrast administration.  CONTRAST:  80mL OMNIPAQUE IOHEXOL 300 MG/ML  SOLN  COMPARISON:  03/04/2013.  FINDINGS: The central airways are patent. There is biapical scarring. There is a stable 4 mm noncalcified right upper lobe pulmonary nodule (image 12/series  3). There is a focal ill-defined area of ground-glass opacity in the posterior left upper lobe abutting the major fissure with air bronchograms within the area of ground-glass opacity. The area of ground-glass measures approximately 3.9 x 1.7 cm which is unchanged from the prior exam. There is no  pneumothorax. There are bilateral small pleural effusions.  There are no pathologically enlarged axillary, hilar or mediastinal lymph nodes.  The heart size is normal. There is no pericardial effusion. The thoracic aorta is normal in caliber.  Review of bone windows demonstrates no focal lytic or sclerotic lesions. There is a chronic L1 compression fracture. .  Limited non-contrast images of the upper abdomen were obtained. The adrenal glands appear normal. The remainder of the visualized abdominal organs are unremarkable.  IMPRESSION: 1. Stable focal area of ground-glass measuring 3.9 x 1.7 cm in the posterior left upper lobe, unchanged compared with 03/04/2013. This may be secondary to an infectious or inflammatory etiology. Adenocarcinoma in situ could present in a similar fashion. Recommend thoracic surgery consultation.  2. There is a nonspecific 4 mm subpleural pulmonary nodule in the right upper lobe. If the patient is at high risk for bronchogenic carcinoma, follow-up chest CT at 1year is recommended. If the patient is at low risk, no follow-up is needed. This recommendation follows the consensus statement: Guidelines for Management of Small Pulmonary Nodules Detected on CT Scans: A Statement from the Fleischner Society as published in Radiology 2005; 237:395-400.   Electronically Signed   By: Elige Ko   On: 05/03/2013 13:28      Recent Lab Findings: Lab Results  Component Value Date   WBC 5.8 06/08/2013   HGB 13.3 06/08/2013   HCT 40.3 06/08/2013   PLT 184 06/08/2013   GLUCOSE 90 06/08/2013   ALT 30 06/08/2013   AST 27 06/08/2013   NA 139 06/08/2013   K 5.0 06/08/2013   CL 103 06/08/2013   CREATININE 0.96 06/08/2013   BUN 12 06/08/2013   CO2 28 06/08/2013   TSH 1.264 03/03/2013   INR 0.98 06/08/2013   PFT'S- FEV1 1.6  101%  DLCO 57%   Assessment / Plan:   focal ill-defined area of ground-glass opacity in the posterior left upper lobe abutting the major fissure approximately 3.9  x 1.7 cm- possible slow-growing adenocarcinoma versus inflammatory process Hyperlipemia;  Acid reflux;  Macular degeneration;   PSVT (paroxysmal supraventricular tachycardia) (03/05/2013);   Ankle fracture (05/22/2013). recent  I discussed with the patient her family possible diagnosis of the left upper lobe lesion, carcinoma versus inflammatory process. I recommended that we proceed with further evaluation including bronchoscopy with ENB, EBUS and Biopsy.   The goals risks and alternatives of the planned surgical procedure bronchoscopy with ENB, EBUS and Biopsy  have been discussed with the patient in detail. The risks of the procedure including death, infection, stroke, myocardial infarction, bleeding, blood transfusion have all been discussed specifically.  I have quoted Marcia Woods a 2 % of perioperative mortality and a complication rate as high as 15 %. The patient's questions have been answered.Marcia Woods is willing  to proceed with the planned procedure.   Delight Ovens MD      [address] [place] [place] [address] Office [phone]   Beeper [phone]  06/11/2013 7:20 AM

## 2013-06-11 NOTE — Anesthesia Preprocedure Evaluation (Addendum)
Anesthesia Evaluation  Patient identified by MRN, date of birth, ID band Patient awake    Reviewed: Allergy & Precautions, H&P , NPO status , Patient's Chart, lab work & pertinent test results, reviewed documented beta blocker date and time   History of Anesthesia Complications Negative for: history of anesthetic complications  Airway Mallampati: I TM Distance: >3 FB Neck ROM: Full    Dental  (+) Edentulous Upper and Edentulous Lower   Pulmonary shortness of breath and with exertion,    Pulmonary exam normal + decreased breath sounds      Cardiovascular negative cardio ROS  + dysrhythmias Atrial Fibrillation Rhythm:Irregular Rate:Normal     Neuro/Psych negative psych ROS   GI/Hepatic negative GI ROS, Neg liver ROS, hiatal hernia, GERD-  Medicated and Controlled,  Endo/Other  negative endocrine ROS  Renal/GU negative Renal ROS     Musculoskeletal   Abdominal Normal abdominal exam  (+)   Peds  Hematology   Anesthesia Other Findings   Reproductive/Obstetrics                          Anesthesia Physical Anesthesia Plan  ASA: III  Anesthesia Plan: General   Post-op Pain Management:    Induction: Intravenous  Airway Management Planned: Oral ETT  Additional Equipment:   Intra-op Plan:   Post-operative Plan: Extubation in OR  Informed Consent: I have reviewed the patients History and Physical, chart, labs and discussed the procedure including the risks, benefits and alternatives for the proposed anesthesia with the patient or authorized representative who has indicated his/her understanding and acceptance.   Dental advisory given  Plan Discussed with: CRNA, Anesthesiologist and Surgeon  Anesthesia Plan Comments:        Anesthesia Quick Evaluation

## 2013-06-11 NOTE — Preoperative (Signed)
Beta Blockers   Reason not to administer Beta Blockers:Patient took BB and Ca Channel Blocker this AM @ 4:00

## 2013-06-11 NOTE — Anesthesia Postprocedure Evaluation (Signed)
  Anesthesia Post-op Note  Patient: Marcia Woods  Procedure(s) Performed: Procedure(s): VIDEO BRONCHOSCOPY WITH ENDOBRONCHIAL NAVIGATION (N/A) ENDOBRONCHIAL ULTRASOUND (N/A) TRANSBRONCHIAL BIOPSIES (N/A)  Patient Location: PACU  Anesthesia Type:General  Level of Consciousness: awake  Airway and Oxygen Therapy: Patient Spontanous Breathing  Post-op Pain: mild  Post-op Assessment: Post-op Vital signs reviewed  Post-op Vital Signs: Reviewed  Complications: No apparent anesthesia complications

## 2013-06-11 NOTE — Brief Op Note (Signed)
      [address]       [place] [address]             [phone]      06/11/2013  10:12 AM  PATIENT:  Marcia Woods  79 y.o. female  PRE-OPERATIVE DIAGNOSIS:  LUNG LESION  POST-OPERATIVE DIAGNOSIS:  LUNG LESION  PROCEDURE:  Procedure(s): VIDEO BRONCHOSCOPY WITH ENDOBRONCHIAL NAVIGATION (N/A) ENDOBRONCHIAL ULTRASOUND (N/A) TRANSBRONCHIAL BIOPSIES (N/A)  SURGEON:  Surgeon(s) and Role:    * Delight Ovens, MD - Primary  PHYSICIAN ASSISTANT:   ASSISTANTS: none   ANESTHESIA:   general  EBL:     BLOOD ADMINISTERED:none  DRAINS: none   LOCAL MEDICATIONS USED:  NONE  SPECIMEN:  Source of Specimen:  left upper lobe lesion  DISPOSITION OF SPECIMEN:  PATHOLOGY  COUNTS:  NO count needed   DICTATION: .Dragon Dictation  PLAN OF CARE: Discharge to home after PACU  PATIENT DISPOSITION:  PACU - hemodynamically stable.   Delay start of Pharmacological VTE agent (>24hrs) due to surgical blood loss or risk of bleeding: yes

## 2013-06-12 ENCOUNTER — Encounter (HOSPITAL_COMMUNITY): Payer: Self-pay | Admitting: Cardiothoracic Surgery

## 2013-06-12 NOTE — Op Note (Signed)
NAMEHEBAH, BOGOSIAN NO.:  [id number]  MEDICAL RECORD NO.:  [id number]  LOCATION:  [facility]                         FACILITY:  [facility]  PHYSICIAN:  Sheliah Plane, MD    DATE OF BIRTH:  06-20-1926  DATE OF PROCEDURE:  06/11/2013 DATE OF DISCHARGE:  06/11/2013                              OPERATIVE REPORT   PREOPERATIVE DIAGNOSIS:  Left upper lobe ground-glass lung mass, suspicious for carcinoma.  POSTOPERATIVE DIAGNOSIS:  Left upper lobe ground-glass lung mass, suspicious for carcinoma.  SURGICAL PROCEDURE:  Bronchoscopy, EBUS, electromagnetic navigational bronchoscopy with transbronchial biopsies and brushings.  SURGEON:  Sheliah Plane, MD  BRIEF HISTORY:  The patient is a 79 year old female who was found incidentally in September 2014, to have a infiltrative process in the left upper lobe.  Followup CT scan and PET scan in December showed persistence of the infiltrative process in the posterior segment of the left upper lobe.  A PET scan showed no evidence of any other that the area on PET scan was mildly hypermetabolic, there was no other suspicious areas.  Bronchoscopy with biopsy was recommended to the patient who agreed and signed informed consent.  DESCRIPTION OF PROCEDURE:  The patient underwent general endotracheal anesthesia without incident.  A single-lumen endotracheal tube was placed.  Appropriate time-out was performed.  We then proceeded with a fiberoptic bronchoscope and a survey bronchoscopy was performed to the subsegmental level both in the right and left mainstem bronchus, bronchial tree.  There were no endobronchial lesions noted.  The scope was removed, and the EBUS scope was placed and consistent with the CT and PET scan findings.  There were no significantly enlarged mediastinal nodes that could be biopsied.  The scope was removed.  Appropriate sensors were placed on the patient for ENB.  The previously created plan was loaded into  the ENB hardware registration of the data with the sensor with bronchoscopy was then performed.  Initially, we selected a 90-degree working channel but after some difficulty in manipulating to the target area, we switched to a 90-degree working channel.  As the plan dictated, we proceeded at the posterior segment, and localized the target which was relatively large and diffuse; and under fluoroscopic guidance, multiple passes with the needle brush were done creating cytologic slides.  In addition, multiple passes with the biopsy forceps were performed.  Biopsy specimens were sent for permanent.  On the initial smear of the cytologic specimens, atypia were noted but no definite diagnosis could be made.  We encountered no significant bleeding.  Bronchial washings were also sent.  The scope was removed.  The patient tolerated the procedure without any bleeding, was awakened and extubated in the operating room and transferred to the recovery room for further postoperative care.     Sheliah Plane, MD     EG/MEDQ  D:  06/12/2013  T:  06/12/2013  Job:  [id number]

## 2013-06-13 LAB — CULTURE, RESPIRATORY W GRAM STAIN

## 2013-06-18 ENCOUNTER — Ambulatory Visit (INDEPENDENT_AMBULATORY_CARE_PROVIDER_SITE_OTHER): Payer: Medicare Other | Admitting: Cardiothoracic Surgery

## 2013-06-18 ENCOUNTER — Encounter: Payer: Self-pay | Admitting: Cardiothoracic Surgery

## 2013-06-18 VITALS — BP 120/84 | HR 120 | Resp 20 | Ht 63.0 in | Wt 148.0 lb

## 2013-06-18 DIAGNOSIS — Z9889 Other specified postprocedural states: Secondary | ICD-10-CM

## 2013-06-18 DIAGNOSIS — R911 Solitary pulmonary nodule: Secondary | ICD-10-CM

## 2013-06-18 NOTE — Progress Notes (Signed)
Marcia Woods [facility] Medical Record #[id number] Date of Birth: [birth year]  Referring: Carylon Perches, MD Primary Care: Carylon Perches, MD  Chief Complaint:    Chief Complaint  Patient presents with  . Routine Post Op    Furthur discuss BX results, S/P EBUS 06/11/13    History of Present Illness:    Marcia Woods 79 y.o. female being worked up  for focal ill-defined area of ground-glass  opacity in the posterior left upper lobe abutting the major fissure  with air bronchograms within the area of ground-glass opacity. The  area of ground-glass measures approximately 3.9 x 1.7 cm which is unchanged from the exam in sept 2014.  In September of this year the patient developed a rapid heart rate he was seen for rapid atrial fibrillation, during that admission because of the elevated d-dimer and shortness of breath a CT scan of the chest to rule out pulmonary embolus was performed. 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 She has no history of tuberculosis or known  tuberculosis exposure.    The patient is not on any anticoagulation, currently she is on aware of her heart rhythm but is again in atrial fibrillation in the office today. She was seen by cardiology 2 weeks ago,   Last week Bronchoscopy with ENB and Biopsy was done: Path: Within the tissue biopsies, there is a single minute focus of epithelium with highly atypical features. Although this focus is suspicious for malignancy, given the minute size, definitive classification is not possible.  Current Activity/ Functional Status:  Patient is independent with mobility/ambulation, transfers, ADL's, IADL's.  Zubrod Score: At the time of surgery this patient's most appropriate activity status/level should be described as: []  Normal activity, no symptoms [x]  Symptoms, fully  ambulatory []  Symptoms, in bed less than or equal to 50% of the time []  Symptoms, in bed greater than 50% of the time but less than 100% []  Bedridden []  Moribund   Past Medical History  Diagnosis Date  . Hyperlipemia   . Acid reflux   . Macular degeneration   . Constipation   . Anxiety   . Arthritis   . Vaginal atrophy   . PSVT (paroxysmal supraventricular tachycardia) 03/05/2013  . Ankle fracture 05/22/2013  . Shortness of breath     occ  . H/O hiatal hernia     Past Surgical History  Procedure Laterality Date  . Knee arthroscopy  2010    left-[facility]  . Trigger finger release      right  . Hemorrhoid surgery    . Cataract surgery      bilaterally  . Abdominal hysterectomy    . Colonoscopy  04/30/2011    Procedure: COLONOSCOPY;  Surgeon: Malissa Hippo, MD;  Location: [facility];  Service: Endoscopy;  Laterality: N/A;  8:30 am  . Esophagogastroduodenoscopy  05/27/2011    Procedure: ESOPHAGOGASTRODUODENOSCOPY (EGD);  Surgeon: Malissa Hippo, MD;  Location: [facility];  Service: Endoscopy;  Laterality: N/A;  300  . Cervical disc surgery    . Cholecystectomy  01/26/2012    Procedure: LAPAROSCOPIC CHOLECYSTECTOMY;  Surgeon: [name], MD;  Location: [facility] ORS;  Service: General;  Laterality: N/A;  . Video bronchoscopy with endobronchial navigation N/A  06/11/2013    Procedure: VIDEO BRONCHOSCOPY WITH ENDOBRONCHIAL NAVIGATION;  Surgeon: Delight Ovens, MD;  Location: [facility] OR;  Service: Thoracic;  Laterality: N/A;  . Endobronchial ultrasound N/A 06/11/2013    Procedure: ENDOBRONCHIAL ULTRASOUND;  Surgeon: Delight Ovens, MD;  Location[facility] OR;  Service: Thoracic;  Laterality: N/A;  . Bronchial biopsy N/A 06/11/2013    Procedure: TRANSBRONCHIAL BIOPSIES;  Surgeon: Delight Ovens, MD;  Location: [facility] OR;  Service: Thoracic;  Laterality: N/A;    Family History  Problem Relation Age of Onset  . Colon cancer Neg Hx   . Heart disease Mother   . Diabetes Mother     . Heart disease Father   . Diabetes Father     History   Social History  . Marital Status: Widowed    Spouse Name: N/A    Number of Children: N/A  . Years of Education: N/A   Occupational History  . Not on file.   Social History Main Topics  . Smoking status: Never Smoker   . Smokeless tobacco: Never Used  . Alcohol Use: No  . Drug Use: No  . Sexual Activity: No          Social History Narrative  .  patient lives alone , she does not drive and never has     History  Smoking status  . Never Smoker   Smokeless tobacco  . Never Used    History  Alcohol Use No     Allergies  Allergen Reactions  . Codeine Nausea And Vomiting    Current Outpatient Prescriptions  Medication Sig Dispense Refill  . ALPRAZolam (XANAX) 0.5 MG tablet Take 0.25-0.5 mg by mouth at bedtime. For sleep      . aspirin EC 81 MG EC tablet Take 1 tablet (81 mg total) by mouth daily.  30 tablet  1  . cholecalciferol (VITAMIN D) 1000 UNITS tablet Take 1,000 Units by mouth daily.      Marland Kitchen diltiazem (CARDIZEM CD) 180 MG 24 hr capsule Take 360 mg by mouth daily.       Marland Kitchen docusate sodium (COLACE) 100 MG capsule Take 200 mg by mouth every evening.      Marland Kitchen ketotifen (THERA TEARS ALLERGY) 0.025 % ophthalmic solution Place 1 drop into both eyes daily as needed (for irritation).       . metoprolol tartrate (LOPRESSOR) 25 MG tablet Take 1.5 tablets (37.5 mg total) by mouth 2 (two) times daily.  270 tablet  3  . pantoprazole (PROTONIX) 40 MG tablet Take 40 mg by mouth every morning.       . pravastatin (PRAVACHOL) 40 MG tablet Take 40 mg by mouth at bedtime.        No current facility-administered medications for this visit.       Review of Systems:     Cardiac Review of Systems: Y or N  Chest Pain [  n  ]  Resting SOB [ n  ] Exertional SOB  Cove.Etienne  ]  Orthopnea [ y ]   Pedal Edema [y left leg   ]    Palpitations [ y ] Syncope  [ n ]   Presyncope [n   ]  General Review of Systems: [Y] = yes [   ]=no Constitional: recent weight change [  ];  Wt loss over the last 3 months [ none  ] anorexia [  ]; fatigue [  ]; nausea [  ]; night sweats [  ]; fever [  ];  or chills [  ];          Dental: poor dentition[  ]; Last Dentist visit:   Eye : blurred vision [  ]; diplopia [   ]; vision changes [  ];  Amaurosis fugax[  ]; Resp: cough [  ];  wheezing[  ];  hemoptysis[  ]; shortness of breath[  ]; paroxysmal nocturnal dyspnea[  ]; dyspnea on exertion[ y ]; or orthopnea[  ];  GI:  gallstones[removed  ], vomiting[  ];  dysphagia[  ]; melena[  ];  hematochezia [  ]; heartburn[ y ];   Hx of  Colonoscopy[  ]; GU: kidney stones [  ]; hematuria[  ];   dysuria [  ];  nocturia[  ];  history of     obstruction [ n ]; urinary frequency [  ]             Skin: rash, swelling[  ];, hair loss[  ];  peripheral edema[  ];  or itching[  ]; Musculosketetal: myalgias[  ];  joint swelling[  ];  joint erythema[  ];  joint pain[  ];  back pain[  ];  Heme/Lymph: bruising[  ];  bleeding[  ];  anemia[  ];  Neuro: TIA[  ];  headaches[  ];  stroke[  ];  vertigo[  ];  seizures[  ];   paresthesias[  ];  difficulty walking[ n ];  Psych:depression[n  ]; anxiety[ n ];  Endocrine: diabetes[  ];  thyroid dysfunction[  ];  Immunizations: Flu up to date [ y ]; Pneumococcal up to date Cove.Etienne  ];  Other:  Physical Exam: BP 120/84  Pulse 120  Resp 20  Ht 5\' 3"  (1.6 m)  Wt 148 lb (67.132 kg)  BMI 26.22 kg/m2  SpO2 97%  General appearance: alert, cooperative and appears stated age Neurologic: intact Heart: irregularly irregular rhythm Lungs: clear to auscultation bilaterally Abdomen: soft, non-tender; bowel sounds normal; no masses,  no organomegaly Extremities: edema Patient has edema of the left ankle which has recently been removed from the cast secondary to ankle fracture and Homans sign is negative, no sign of DVT Patient has no cervical supraclavicular adenopathy no carotid bruits   Diagnostic Studies & Laboratory data:       Recent Radiology Findings: Nm Pet Image Initial (pi) Skull Base To Thigh  06/06/2013   CLINICAL DATA:  Initial treatment strategy for lung nodule.  EXAM: NUCLEAR MEDICINE PET SKULL BASE TO THIGH  FASTING BLOOD GLUCOSE:  Value: 92mg /dl  TECHNIQUE: 16.1 mCi W-96 FDG was injected intravenously. CT data was obtained and used for attenuation correction and anatomic localization only. (This was not acquired as a diagnostic CT examination.) Additional exam technical data entered on technologist worksheet.  COMPARISON:  Chest CT 05/03/2013.  FINDINGS: NECK  No hypermetabolic lymph nodes in the neck.  CHEST  4.1 x 1.9 cm ground-glass attenuation mass like opacity in the posterior aspect of the left upper lobe with internal air bronchograms appears similar to prior studies. This lesion demonstrates diffuse low-level hypermetabolism (SUVmax = 2.1). No other suspicious appearing pulmonary nodules or masses are otherwise identified. Linear opacities in the lingula, right middle lobe and lower lobes of the lungs bilaterally are most compatible with areas of mild chronic scarring. No hypermetabolic mediastinal or hilar nodes. Heart size is mildly enlarged. There is no significant pericardial fluid, thickening or pericardial calcification. There is atherosclerosis of the thoracic aorta, the great vessels of the mediastinum and the coronary  arteries, including calcified atherosclerotic plaque in the left anterior descending coronary arteries. Small hiatal hernia.  ABDOMEN/PELVIS  No abnormal hypermetabolic activity within the liver, pancreas, adrenal glands, or spleen. No hypermetabolic lymph nodes in the abdomen or pelvis. Multiple small calcifications scattered throughout the liver compatible with granulomas. Status post cholecystectomy. Normal appendix. Status post hysterectomy. Ovaries are not confidently identified may be surgically absent or atrophic.  SKELETON  No focal hypermetabolic activity to suggest skeletal  metastasis. Orthopedic fixation hardware in the lower cervical spine incidentally noted.  IMPRESSION: 1. 4.1 x 1.9 cm ground-glass attenuation mass like opacity in the posterior aspect of the left upper lobe demonstrates low level hypermetabolism, concerning for potential adenocarcinoma. Correlation with biopsy is recommended if clinically appropriate. 2. Additional incidental findings, as above.   Electronically Signed   By: Trudie Reed M.D.   On: 06/06/2013 14:04     Dg Ankle Complete Left  05/22/2013   3 views left ankle  Left ankle fracture  Previous x-rays compared  There is a distal fibular fracture transverse consistent with a Weber A  type injury  Fracture line is resolving, no displacement is noted. Ankle mortise is  intact  Impression healing Weber A fracture  Ct Chest W Contrast  05/03/2013   CLINICAL DATA:  Follow ground-glass opacity in the posterior left lung. No chest complaints.  EXAM: CT CHEST WITH CONTRAST  TECHNIQUE: Multidetector CT imaging of the chest was performed during intravenous contrast administration.  CONTRAST:  80mL OMNIPAQUE IOHEXOL 300 MG/ML  SOLN  COMPARISON:  03/04/2013.  FINDINGS: The central airways are patent. There is biapical scarring. There is a stable 4 mm noncalcified right upper lobe pulmonary nodule (image 12/series 3). There is a focal ill-defined area of ground-glass opacity in the posterior left upper lobe abutting the major fissure with air bronchograms within the area of ground-glass opacity. The area of ground-glass measures approximately 3.9 x 1.7 cm which is unchanged from the prior exam. There is no pneumothorax. There are bilateral small pleural effusions.  There are no pathologically enlarged axillary, hilar or mediastinal lymph nodes.  The heart size is normal. There is no pericardial effusion. The thoracic aorta is normal in caliber.  Review of bone windows demonstrates no focal lytic or sclerotic lesions. There is a chronic L1 compression  fracture. .  Limited non-contrast images of the upper abdomen were obtained. The adrenal glands appear normal. The remainder of the visualized abdominal organs are unremarkable.  IMPRESSION: 1. Stable focal area of ground-glass measuring 3.9 x 1.7 cm in the posterior left upper lobe, unchanged compared with 03/04/2013. This may be secondary to an infectious or inflammatory etiology. Adenocarcinoma in situ could present in a similar fashion. Recommend thoracic surgery consultation.  2. There is a nonspecific 4 mm subpleural pulmonary nodule in the right upper lobe. If the patient is at high risk for bronchogenic carcinoma, follow-up chest CT at 1year is recommended. If the patient is at low risk, no follow-up is needed. This recommendation follows the consensus statement: Guidelines for Management of Small Pulmonary Nodules Detected on CT Scans: A Statement from the Fleischner Society as published in Radiology 2005; 237:395-400.   Electronically Signed   By: Elige Ko   On: 05/03/2013 13:28      Recent Lab Findings: Lab Results  Component Value Date   WBC 5.8 06/08/2013   HGB 13.3 06/08/2013   HCT 40.3 06/08/2013   PLT 184 06/08/2013   GLUCOSE 90 06/08/2013   ALT 30 06/08/2013  AST 27 06/08/2013   NA 139 06/08/2013   K 5.0 06/08/2013   CL 103 06/08/2013   CREATININE 0.96 06/08/2013   BUN 12 06/08/2013   CO2 28 06/08/2013   TSH 1.264 03/03/2013   INR 0.98 06/08/2013   PFT'S- FEV1 1.6  101%  DLCO 57%   Assessment / Plan:   Focal ill-defined area of ground-glass opacity in the posterior left upper lobe abutting the major fissure approximately 3.9 x 1.7 cm- possible slow-growing adenocarcinoma versus inflammatory process Hyperlipemia Acid reflux Macular degeneration PSVT (paroxysmal supraventricular tachycardia) (03/05/2013) Ankle fracture (05/22/2013). recent  I discussed with the patient and  her daughter- in- law the  path findings of the left upper lobe lesion lesion. I have  recommended to her we proceed with surgical resection. She notes the with her age she is absolutely not interested in surgical resection. She is agreeable with discussing treatment with radiation oncology.  cT2a,cN0,cM0  Stage IB  But with path finding: Within the tissue biopsies, there is a single minute focus of epithelium with highly atypical features. Although this focus is suspicious for malignancy, given the minute size, definitive classification is not possible.  After seen in [facility] by radiation oncology can decide  If to proceed with treatment based on above findings or attempt transthoracic  Ct guided needle bx  Delight Ovens MD [address] [place] [address] Office [phone]   Beeper [phone]  06/18/2013 1:54 PM

## 2013-06-19 ENCOUNTER — Telehealth: Payer: Self-pay | Admitting: *Deleted

## 2013-06-19 NOTE — Telephone Encounter (Signed)
Called and spoke with pt regarding appt for [facility] 06/28/13 at 2:45.  She verbalized understanding of time and place of appt.

## 2013-06-27 ENCOUNTER — Encounter: Payer: Self-pay | Admitting: *Deleted

## 2013-06-28 ENCOUNTER — Ambulatory Visit: Payer: Medicare Other | Admitting: Physical Therapy

## 2013-06-28 ENCOUNTER — Encounter: Payer: Self-pay | Admitting: Radiation Oncology

## 2013-06-28 ENCOUNTER — Ambulatory Visit
Admission: RE | Admit: 2013-06-28 | Discharge: 2013-06-28 | Disposition: A | Discharge status: Home / Self Care | Payer: Medicare Other | Source: Ambulatory Visit | Attending: Radiation Oncology | Admitting: Radiation Oncology

## 2013-06-28 DIAGNOSIS — R918 Other nonspecific abnormal finding of lung field: Secondary | ICD-10-CM

## 2013-06-28 DIAGNOSIS — R911 Solitary pulmonary nodule: Secondary | ICD-10-CM

## 2013-06-28 NOTE — Progress Notes (Signed)
Radiation Oncology         [phone] ________________________________  Initial outpatient Consultation  Multidisciplinary Thoracic Oncology Clinic [facility])   Name: Marcia Woods MRN: [id number]  Date: 06/28/2013  DOB: 1925/12/16  [id number], MD  Grace Isaac, MD   REFERRING PHYSICIAN: Grace Isaac, MD  DIAGNOSIS: 80 year old woman with a 4.1 cm left upper lung nodule  HISTORY OF PRESENT ILLNESS::Marcia Woods is a 80 y.o. female who was admitted to the hospital in September 2014 with rapid heart rate due to atrial fibrillation. She underwent chest CT at that time to rule out pulmonary embolism and a 3.9 cm groundglass opacity was seen in the left upper lobe of lung.  Repeat chest CT on 05/03/2013 demonstrated no major change to the ground glass opacity now measuring 3.9x1.7 cm. She underwent PET CT on 06/06/2013. The lesion appeared to measure 4.1x1.9 cm on the study with an SUV max of 2.1. On 06/11/2013, she underwent electrical navigation bronchoscopy and biopsy showing atypical cells without malignancy.  Her case was presented at our multidisciplinary thoracic oncology conference earlier today and she was referred to discuss ongoing observation, versus empiric radiation treatment options versus continued attempts to obtain tissue diagnosis.  PREVIOUS RADIATION THERAPY: No  PAST MEDICAL HISTORY:  has a past medical history of Hyperlipemia; Acid reflux; Macular degeneration; Constipation; Anxiety; Arthritis; Vaginal atrophy; PSVT (paroxysmal supraventricular tachycardia) (03/05/2013); Ankle fracture (05/22/2013); Shortness of breath; and H/O hiatal hernia.    PAST SURGICAL HISTORY: Past Surgical History  Procedure Laterality Date  . Knee arthroscopy  2010    left-[facility]  . Trigger finger release      right  . Hemorrhoid surgery    . Cataract surgery      bilaterally  . Abdominal hysterectomy    . Colonoscopy  04/30/2011    Procedure: COLONOSCOPY;  Surgeon:  Rogene Houston, MD;  [facility];  Service: Endoscopy;  Laterality: N/A;  8:30 am  . Esophagogastroduodenoscopy  05/27/2011    Procedure: ESOPHAGOGASTRODUODENOSCOPY (EGD);  Surgeon: Rogene Houston, MD;  [facility];  Service: Endoscopy;  Laterality: N/A;  300  . Cervical disc surgery    . Cholecystectomy  01/26/2012    Procedure: LAPAROSCOPIC CHOLECYSTECTOMY;  Surgeon: Jamesetta So, MD;  Location: [facility];  Service: General;  Laterality: N/A;  . Video bronchoscopy with endobronchial navigation N/A 06/11/2013    Procedure: VIDEO BRONCHOSCOPY WITH ENDOBRONCHIAL NAVIGATION;  Surgeon: Grace Isaac, MD;  Location: [place];  Service: Thoracic;  Laterality: N/A;  . Endobronchial ultrasound N/A 06/11/2013    Procedure: ENDOBRONCHIAL ULTRASOUND;  Surgeon: Grace Isaac, MD;  Location: [facility] OR;  Service: Thoracic;  Laterality: N/A;  . Bronchial biopsy N/A 06/11/2013    Procedure: TRANSBRONCHIAL BIOPSIES;  Surgeon: Grace Isaac, MD;  Location: [facility] OR;  Service: Thoracic;  Laterality: N/A;    FAMILY HISTORY: family history includes Diabetes in her father and mother; Heart disease in her father and mother. There is no history of Colon cancer.  SOCIAL HISTORY:  reports that she has never smoked. She has never used smokeless tobacco. She reports that she does not drink alcohol or use illicit drugs.  ALLERGIES: Codeine  MEDICATIONS:  Current Outpatient Prescriptions  Medication Sig Dispense Refill  . ALPRAZolam (XANAX) 0.5 MG tablet Take 0.25-0.5 mg by mouth at bedtime. For sleep      . aspirin EC 81 MG EC tablet Take 1 tablet (81 mg total) by mouth daily.  [place]  tablet  1  . cholecalciferol (VITAMIN D) 1000 UNITS tablet Take 1,000 Units by mouth daily.      Marland Kitchen diltiazem (CARDIZEM CD) 180 MG 24 hr capsule Take 360 mg by mouth daily.       Marland Kitchen docusate sodium (COLACE) 100 MG capsule Take 200 mg by mouth every evening.      Marland Kitchen ketotifen (THERA TEARS ALLERGY) 0.025 % ophthalmic  solution Place 1 drop into both eyes daily as needed (for irritation).       . metoprolol tartrate (LOPRESSOR) 25 MG tablet Take 1.5 tablets (37.5 mg total) by mouth 2 (two) times daily.  270 tablet  3  . pantoprazole (PROTONIX) 40 MG tablet Take 40 mg by mouth every morning.       . pravastatin (PRAVACHOL) 40 MG tablet Take 40 mg by mouth at bedtime.        No current facility-administered medications for this encounter.    REVIEW OF SYSTEMS:  A 15 point review of systems is documented in the electronic medical record. This was obtained by the nursing staff. However, I reviewed this with the patient to discuss relevant findings and make appropriate changes.  A comprehensive review of systems was negative.   PHYSICAL EXAM:  The patient is a well-nourished well-developed woman respiration is alert and oriented. She appears somewhat younger than her stated age. Respiratory effort is unremarkable. Speech is fluent and appropriate. Gait is unremarkable.  KPS = 90  100 - Normal; no complaints; no evidence of disease. 90   - Able to carry on normal activity; minor signs or symptoms of disease. 80   - Normal activity with effort; some signs or symptoms of disease. 39   - Cares for self; unable to carry on normal activity or to do active work. 60   - Requires occasional assistance, but is able to care for most of his personal needs. 50   - Requires considerable assistance and frequent medical care. 52   - Disabled; requires special care and assistance. 89   - Severely disabled; hospital admission is indicated although death not imminent. 37   - Very sick; hospital admission necessary; active supportive treatment necessary. 10   - Moribund; fatal processes progressing rapidly. 0     - Dead  Karnofsky DA, Abelmann [place], Craver LS and Burchenal [facility] [phone]) The use of the nitrogen mustards in the palliative treatment of carcinoma: with particular reference to bronchogenic carcinoma Cancer 1  [id number]  LABORATORY DATA:  Lab Results  Component Value Date   WBC 5.8 06/08/2013   HGB 13.3 06/08/2013   HCT 40.3 06/08/2013   MCV 91.6 06/08/2013   PLT 184 06/08/2013   Lab Results  Component Value Date   NA 139 06/08/2013   K 5.0 06/08/2013   CL 103 06/08/2013   CO2 28 06/08/2013   Lab Results  Component Value Date   ALT 30 06/08/2013   AST 27 06/08/2013   ALKPHOS 83 06/08/2013   BILITOT 0.3 06/08/2013     RADIOGRAPHY: Dg Chest 2 View Within Previous 72 Hours.  Films Obtained On Friday Are Acceptable For Monday And Tuesday Cases  06/08/2013   CLINICAL DATA:  Preoperative for lung biopsy  EXAM: CHEST  2 VIEW  COMPARISON:  PET-CT study of June 06, 2013.  FINDINGS: The lungs are hyperinflated. There is no focal infiltrate. The known left upper lobe abnormality is not visible on this study. The cardiopericardial silhouette is mildly enlarged. The pulmonary vascularity is not  engorged. There is no pleural effusion or pneumothorax. The mediastinum is normal in width. There is mild tortuosity of the descending thoracic aorta. There is partial compression of the body of approximately L2. The patient has undergone previous lower anterior cervicothoracic fusion.  IMPRESSION: There is hyperinflation consistent with COPD. No definite acute cardiopulmonary abnormality is demonstrated on this study, but there is known pulmonary parenchymal abnormality in the left upper lobe posteriorly.   Electronically Signed   By: David  Martinique   On: 06/08/2013 15:18   Nm Pet Image Initial (pi) Skull Base To Thigh  06/06/2013   CLINICAL DATA:  Initial treatment strategy for lung nodule.  EXAM: NUCLEAR MEDICINE PET SKULL BASE TO THIGH  FASTING BLOOD GLUCOSE:  Value: 92mg /dl  TECHNIQUE: 20.6 mCi F-18 FDG was injected intravenously. CT data was obtained and used for attenuation correction and anatomic localization only. (This was not acquired as a diagnostic CT examination.) Additional exam technical data  entered on technologist worksheet.  COMPARISON:  Chest CT 05/03/2013.  FINDINGS: NECK  No hypermetabolic lymph nodes in the neck.  CHEST  4.1 x 1.9 cm ground-glass attenuation mass like opacity in the posterior aspect of the left upper lobe with internal air bronchograms appears similar to prior studies. This lesion demonstrates diffuse low-level hypermetabolism (SUVmax = 2.1). No other suspicious appearing pulmonary nodules or masses are otherwise identified. Linear opacities in the lingula, right middle lobe and lower lobes of the lungs bilaterally are most compatible with areas of mild chronic scarring. No hypermetabolic mediastinal or hilar nodes. Heart size is mildly enlarged. There is no significant pericardial fluid, thickening or pericardial calcification. There is atherosclerosis of the thoracic aorta, the great vessels of the mediastinum and the coronary arteries, including calcified atherosclerotic plaque in the left anterior descending coronary arteries. Small hiatal hernia.  ABDOMEN/PELVIS  No abnormal hypermetabolic activity within the liver, pancreas, adrenal glands, or spleen. No hypermetabolic lymph nodes in the abdomen or pelvis. Multiple small calcifications scattered throughout the liver compatible with granulomas. Status post cholecystectomy. Normal appendix. Status post hysterectomy. Ovaries are not confidently identified may be surgically absent or atrophic.  SKELETON  No focal hypermetabolic activity to suggest skeletal metastasis. Orthopedic fixation hardware in the lower cervical spine incidentally noted.  IMPRESSION: 1. 4.1 x 1.9 cm ground-glass attenuation mass like opacity in the posterior aspect of the left upper lobe demonstrates low level hypermetabolism, concerning for potential adenocarcinoma. Correlation with biopsy is recommended if clinically appropriate. 2. Additional incidental findings, as above.   Electronically Signed   By: Vinnie Langton M.D.   On: 06/06/2013 14:04   Dg  Chest Port 1 View  06/11/2013   CLINICAL DATA:  Follow-up bronchoscopy  EXAM: PORTABLE CHEST - 1 VIEW  COMPARISON:  Chest x-ray of June 08, 2013.  FINDINGS: The lungs are well-expanded. No pneumothorax is demonstrated. Minimally increased density in the left upper lobe is noted which may reflect subsegmental atelectasis. The cardiopericardial silhouette is mildly enlarged though stable. The pulmonary vascularity is not engorged. There is no pleural effusion. The observed portions of the bony thorax appear normal.  IMPRESSION: 1. There is no evidence of a post procedure pneumothorax or pleural effusion. 2. Minimal density in the left upper lobe likely reflects subsegmental atelectasis. 3. Mild enlargement of the cardiopericardial silhouette is demonstrated but there is no pulmonary vascular congestion.   Electronically Signed   By: David  Martinique   On: 06/11/2013 10:47   Dg C-arm Bronchoscopy  06/11/2013   CLINICAL DATA: Lung  Lesion, intraoperative   C-ARM BRONCHOSCOPY  Fluoroscopy was utilized by the requesting physician.  No radiographic  interpretation.       IMPRESSION: This patient is a very nice 80 year old woman with an approximate 4 cm groundglass opacity in the left upper lobe lung. Presumably, the lesion appears to represent a adenocarcinoma in situ versus a benign process. Her electro navigation bronchoscopy was nondiagnostic in terms of tissue diagnosis. The patient could potentially undergo repeat electro navigation bronchoscopy, versus CT-guided biopsy, versus short interval followup CT, versus empiric radiotherapy for putative cancer.  PLAN: The patient is opposed to undergoing any further procedures at this time. She does not wish to undergo biopsy currently or any empiric radiation therapy. She is very willing to undergo short interval followup CT in 3 months to assess her ongoing progress, and to monitor the left upper lung nodule.  Today, I ordered a CT scan of the chest to be  performed around March 9 with the intention of presenting her case back to the multidisciplinary thoracic oncology conference and clinic on Thursday, 08/30/2013  I spent 20 minutes minutes face to face with the patient and more than 50% of that time was spent in counseling and/or coordination of care.   ------------------------------------------------  Sheral Apley. Tammi Klippel, M.D.

## 2013-06-29 ENCOUNTER — Telehealth: Payer: Self-pay | Admitting: *Deleted

## 2013-06-29 ENCOUNTER — Encounter: Payer: Self-pay | Admitting: *Deleted

## 2013-06-29 NOTE — Telephone Encounter (Signed)
Called patient to inform of test, spoke with the patient's son- Rex and he is aware of this test

## 2013-07-02 ENCOUNTER — Ambulatory Visit: Payer: Medicare Other | Admitting: Cardiology

## 2013-07-03 ENCOUNTER — Encounter: Payer: Self-pay | Admitting: Cardiology

## 2013-07-03 ENCOUNTER — Ambulatory Visit (INDEPENDENT_AMBULATORY_CARE_PROVIDER_SITE_OTHER): Payer: Medicare Other | Admitting: Cardiology

## 2013-07-03 VITALS — BP 119/67 | HR 86 | Ht 62.0 in | Wt 150.0 lb

## 2013-07-03 DIAGNOSIS — I4891 Unspecified atrial fibrillation: Secondary | ICD-10-CM

## 2013-07-03 DIAGNOSIS — R911 Solitary pulmonary nodule: Secondary | ICD-10-CM

## 2013-07-03 MED ORDER — METOPROLOL TARTRATE 25 MG PO TABS: 25.0000 mg | ORAL_TABLET | Freq: Three times a day (TID) | ORAL | Status: DC

## 2013-07-03 NOTE — Assessment & Plan Note (Signed)
As outlined above, suspicious for adenocarcinoma in situ but not a definitive diagnosis. She has had evaluation by Dr. Servando Snare and also Dr. Tammi Klippel. Plan at this time after reviewing available options is followup chest CT in 3 months.

## 2013-07-03 NOTE — Progress Notes (Signed)
Clinical Summary Marcia Woods is an 80 y.o.female last seen in December 2014. Since then she has undergone further evaluation by Dr. Servando Snare, had bronchoscopy with ENB and biopsy, findings concerning for adenocarcinoma in situ although this was not diagnostic. Resection was discussed with the patient and her daughter-in-law, records indicate that surgery was declined. She tells me that she will be having a followup chest CT in 3 months per evaluation by radiation oncology, no active treatment is planned at this time. I reviewed the recent note.  She reports intermittent palpitations, still has episodic atrial fibrillation most likely. We continued on a combination of calcium channel blocker and beta blocker for rate control, and for now she is on aspirin. Based on her CHADSVASC score of 3, her annual risk of stroke is 3.4% on aspirin versus projected 1.1% on Eliquis which we discussed as an option for anticoagulation. She does not endorse any major bleeding problems, although does tell me that she has a chronic eye condition with "bleeding retina" followed by Dr. Zadie Rhine in [place]. She has a visit to see him tomorrow.   Allergies  Allergen Reactions  . Codeine Nausea And Vomiting    Current Outpatient Prescriptions  Medication Sig Dispense Refill  . ALPRAZolam (XANAX) 0.5 MG tablet Take 0.25-0.5 mg by mouth at bedtime. For sleep      . aspirin EC 81 MG EC tablet Take 1 tablet (81 mg total) by mouth daily.  30 tablet  1  . cholecalciferol (VITAMIN D) 1000 UNITS tablet Take 1,000 Units by mouth daily.      Marland Kitchen diltiazem (CARDIZEM CD) 180 MG 24 hr capsule Take 360 mg by mouth daily.       Marland Kitchen docusate sodium (COLACE) 100 MG capsule Take 200 mg by mouth every evening.      Marland Kitchen ketotifen (THERA TEARS ALLERGY) 0.025 % ophthalmic solution Place 1 drop into both eyes daily as needed (for irritation).       . pantoprazole (PROTONIX) 40 MG tablet Take 40 mg by mouth every morning.       . pravastatin  (PRAVACHOL) 40 MG tablet Take 40 mg by mouth at bedtime.       . metoprolol tartrate (LOPRESSOR) 25 MG tablet Take 1 tablet (25 mg total) by mouth 3 (three) times daily.  120 tablet  6   No current facility-administered medications for this visit.    Past Medical History  Diagnosis Date  . Hyperlipemia   . GERD (gastroesophageal reflux disease)   . Macular degeneration   . Constipation   . Anxiety   . Arthritis   . Vaginal atrophy   . PSVT (paroxysmal supraventricular tachycardia) 03/05/2013  . Ankle fracture 05/22/2013  . Atrial fibrillation     Social History Ms. Cordell reports that she has never smoked. She has never used smokeless tobacco. Ms. Elizarraraz reports that she does not drink alcohol.  Review of Systems Negative except as outlined.  Physical Examination Filed Vitals:   07/03/13 1532  BP: 119/67  Pulse: 86   Filed Weights   07/03/13 1532  Weight: 150 lb (68.04 kg)    Patient appears comfortable at rest.  HEENT: Conjunctiva and lids normal, oropharynx clear.  Neck: Supple, no elevated JVP or carotid bruits, no thyromegaly.  Lungs: Diminished breath sounds but clear to auscultation, nonlabored breathing at rest.  Cardiac: Irregularly irregular, no S3, no pericardial rub.  Abdomen: Soft, nontender,bowel sounds present, no guarding or rebound.  Extremities: No pitting edema, distal  pulses 1-2+.    Problem List and Plan   Atrial fibrillation For now we will continue a strategy of heart rate control, change Lopressor dosing to 25 mg 3 times a day to spread out coverage. Otherwise continue current dose of Cardizem CD. She remains on aspirin, and we have discussed anticoagulation potentially with Eliquis for additional stroke risk reduction. I asked her to check with Dr. Zadie Rhine tomorrow regarding the status of her chronic eye condition to make sure this is not a potential contraindication to using anticoagulants. Her daughter states that they will let us know, and we  can make a decision from there. Recent lab work reviewed.  Solitary lung nodule As outlined above, suspicious for adenocarcinoma in situ but not a definitive diagnosis. She has had evaluation by Dr. Servando Snare and also Dr. Tammi Klippel. Plan at this time after reviewing available options is followup chest CT in 3 months.    Satira Sark, M.D., F.A.C.C.

## 2013-07-03 NOTE — Patient Instructions (Signed)
Your physician recommends that you schedule a follow-up appointment in: [place] has recommended you make the following change in your medication:   1) INCREASE LOPRESSOR 25MG  TO THREE TIMES DAILY

## 2013-07-03 NOTE — Assessment & Plan Note (Signed)
For now we will continue a strategy of heart rate control, change Lopressor dosing to 25 mg 3 times a day to spread out coverage. Otherwise continue current dose of Cardizem CD. She remains on aspirin, and we have discussed anticoagulation potentially with Eliquis for additional stroke risk reduction. I asked her to check with Dr. Zadie Rhine tomorrow regarding the status of her chronic eye condition to make sure this is not a potential contraindication to using anticoagulants. Her daughter states that they will let us know, and we can make a decision from there. Recent lab work reviewed.

## 2013-07-04 ENCOUNTER — Telehealth: Payer: Self-pay | Admitting: *Deleted

## 2013-07-04 ENCOUNTER — Other Ambulatory Visit: Payer: Self-pay

## 2013-07-04 ENCOUNTER — Telehealth: Payer: Self-pay | Admitting: Cardiology

## 2013-07-04 DIAGNOSIS — I4891 Unspecified atrial fibrillation: Secondary | ICD-10-CM

## 2013-07-04 MED ORDER — APIXABAN 2.5 MG PO TABS: 2.5000 mg | ORAL_TABLET | Freq: Two times a day (BID) | ORAL | Status: DC

## 2013-07-04 NOTE — Telephone Encounter (Signed)
Pt aware to stop ASA,start Eliquis 2.5 mg bid labs before next visit    rx to [facility]

## 2013-07-04 NOTE — Telephone Encounter (Signed)
PT HAS GOT THE OKAY BY EYE DOCTORTO START ON ELIQUIS PLEASE CALL IT IN TO [place]

## 2013-07-04 NOTE — Telephone Encounter (Signed)
Would begin Eliquis at 2.5 mg twice daily for now. Technically, based on weight and creatinine, she should be able to take the higher dose of 5 mg twice daily, but I would like to start low to make sure she tolerates it first. She needs to stop aspirin as well. Schedule CBC and BMET for her followup visit.

## 2013-07-04 NOTE — Telephone Encounter (Signed)
Dr.Rankin approved the use of Eliquis, please advise dosage

## 2013-07-04 NOTE — Telephone Encounter (Signed)
Patient states that she can not afford co pay for Eliquis / wants to know if there is a generic / tgs

## 2013-07-05 NOTE — Telephone Encounter (Signed)
Please advise per our office does not receive samples in the amount of 2.5mg  and they can not be split and coupon will not assist with pt per no insurance and pt assistance can take an adequate amount of time to be processed per company

## 2013-07-06 NOTE — Telephone Encounter (Signed)
-  Spoke to pt to advise results/instructions. Pt understood. Pt will call back on Monday to make sure they arrive

## 2013-07-06 NOTE — Telephone Encounter (Signed)
Spoke to [place] with Eliquis whom advised that he will bring the 2.5 samples on Monday, Dr. Domenic Polite made aware, message left for pt to advise

## 2013-07-06 NOTE — Telephone Encounter (Signed)
Please contact our Eliquis rep and explain the difficulty this patient is having. They may be able to provide the lower dose samples or determine if another assistance plan could work. Alternatively, we could use Coumadin.

## 2013-07-10 LAB — FUNGUS CULTURE W SMEAR: Fungal Smear: NONE SEEN

## 2013-07-17 NOTE — Telephone Encounter (Signed)
Noted finally received the samples and pt assistance form to be completed by pt to see if she can qualify for free/lower cost medication, samples and card placed up front, pt aware and will pick up tomorrow. Pt will call back to our office with any further concerns if necessary

## 2013-07-23 ENCOUNTER — Telehealth: Payer: Self-pay | Admitting: Orthopedic Surgery

## 2013-07-23 NOTE — Telephone Encounter (Signed)
Marcia Woods seen 05/22/13 for follow up of left ankle fracture.  At that visit she removed the boot.  Said her ankle was swollen then and is still swelling. Asking if she needs to be seen again or what do you advise. Her # [phone]

## 2013-07-23 NOTE — Telephone Encounter (Signed)
APPLY ICE AND ELEVEATE THE LEG   DOES NOT NEED RE EVALUATION

## 2013-07-23 NOTE — Telephone Encounter (Signed)
Advised the patient of doctor's reply °

## 2013-07-24 LAB — AFB CULTURE WITH SMEAR (NOT AT ARMC): Acid Fast Smear: NONE SEEN

## 2013-08-27 ENCOUNTER — Encounter (HOSPITAL_COMMUNITY): Payer: Self-pay

## 2013-08-27 ENCOUNTER — Ambulatory Visit (HOSPITAL_COMMUNITY)
Admission: RE | Admit: 2013-08-27 | Discharge: 2013-08-27 | Disposition: A | Discharge status: Home / Self Care | Payer: Medicare Other | Source: Ambulatory Visit | Attending: Radiation Oncology | Admitting: Radiation Oncology

## 2013-08-27 DIAGNOSIS — J438 Other emphysema: Secondary | ICD-10-CM | POA: Insufficient documentation

## 2013-08-27 DIAGNOSIS — J984 Other disorders of lung: Secondary | ICD-10-CM | POA: Insufficient documentation

## 2013-08-27 DIAGNOSIS — R918 Other nonspecific abnormal finding of lung field: Secondary | ICD-10-CM

## 2013-08-28 ENCOUNTER — Telehealth: Payer: Self-pay | Admitting: *Deleted

## 2013-08-28 NOTE — Telephone Encounter (Signed)
Called pt set up for [facility] 08/30/13 per Dr. Johny Shears request.  She verbalized understanding of time and place of appt

## 2013-08-30 ENCOUNTER — Encounter: Payer: Self-pay | Admitting: *Deleted

## 2013-08-30 ENCOUNTER — Ambulatory Visit
Admission: RE | Admit: 2013-08-30 | Discharge: 2013-08-30 | Disposition: A | Discharge status: Home / Self Care | Payer: Medicare Other | Source: Ambulatory Visit | Attending: Radiation Oncology | Admitting: Radiation Oncology

## 2013-08-30 ENCOUNTER — Encounter: Payer: Self-pay | Admitting: Radiation Oncology

## 2013-08-30 ENCOUNTER — Ambulatory Visit: Payer: Medicare Other | Admitting: Physical Therapy

## 2013-08-30 DIAGNOSIS — R911 Solitary pulmonary nodule: Secondary | ICD-10-CM

## 2013-08-30 NOTE — Progress Notes (Signed)
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  Narrative:  The patient returns today for routine follow-up.  I met with her initially on 06/28/2013. At that time, we discussed the potential for nodule biopsy to confirm malignancy. Thereafter, the patient may be an appropriate candidate for stereotactic body radiotherapy. The patient was related to consider biopsy at that time and also reluctant to consider any further treatment. She was agreeable to ongoing surveillance and ongoing conversation regarding potential diagnostic and therapeutic options. She returns today following an updated chest CT reviewed in our multidisciplinary conference earlier today.                              ALLERGIES:  is allergic to codeine.  Meds: Current Outpatient Prescriptions  Medication Sig Dispense Refill  . ALPRAZolam (XANAX) 0.5 MG tablet Take 0.25-0.5 mg by mouth at bedtime. For sleep      . apixaban (ELIQUIS) 2.5 MG TABS tablet Take 1 tablet (2.5 mg total) by mouth 2 (two) times daily.  60 tablet  3  . cholecalciferol (VITAMIN D) 1000 UNITS tablet Take 1,000 Units by mouth daily.      Marland Kitchen diltiazem (CARDIZEM CD) 180 MG 24 hr capsule Take 360 mg by mouth daily.       Marland Kitchen docusate sodium (COLACE) 100 MG capsule Take 200 mg by mouth every evening.      Marland Kitchen ketotifen (THERA TEARS ALLERGY) 0.025 % ophthalmic solution Place 1 drop into both eyes daily as needed (for irritation).       . metoprolol tartrate (LOPRESSOR) 25 MG tablet Take 1 tablet (25 mg total) by mouth 3 (three) times daily.  120 tablet  6  . pantoprazole (PROTONIX) 40 MG  tablet Take 40 mg by mouth every morning.       . pravastatin (PRAVACHOL) 40 MG tablet Take 40 mg by mouth at bedtime.        No current facility-administered medications for this encounter.    Physical Findings: The patient is in no acute distress. Patient is alert and oriented. .  No significant changes.  Lab Findings: Lab Results  Component Value Date   WBC 5.8 06/08/2013   HGB 13.3 06/08/2013   HCT 40.3 06/08/2013   MCV 91.6 06/08/2013   PLT 184 06/08/2013    @LASTCHEM @  Radiographic Findings: Ct Chest Wo Contrast  08/27/2013   CLINICAL DATA:  Pulmonary lesion.  EXAM: CT CHEST WITHOUT CONTRAST  TECHNIQUE: Multidetector CT imaging of the chest was performed following the standard protocol without IV contrast.  COMPARISON:  PET-CT from 06/06/2013 and chest CT from 05/03/2013  FINDINGS: There is no axillary lymphadenopathy. 11 mm short axis paratracheal lymph node is unchanged. 7 mm short axis subcarinal lymph node is stable. Heart size is upper normal. There is no pericardial effusion. Tiny right pleural effusion noted. The patient had a tiny left pleural effusion previously which has resolved in the interval.  Lung windows demonstrate emphysema. Posterior left upper lobe ground-glass opacity persists, measuring 3.8 x 1.6 cm today compared to 3.9 x 1.7 cm previously.  The 4 mm posterior right upper lobe nodule seen on image 16 today is unchanged. Chronic subsegmental atelectasis or scarring in the right middle lobe and both posterior lower lobes is stable. Chronic atelectasis or scarring in the lingula is also unchanged.  Bone windows reveal no worrisome lytic or sclerotic osseous lesions. Compression deformity of the L1 vertebral body is stable.  IMPRESSION: Stable exam. No interval change in the 3.9 cm posterior left upper lobe ground-glass opacity.  4 mm right upper lobe nodule is also stable.  No new or progressive findings.   Electronically Signed   By: Marcia Woods M.D.   On:  08/27/2013 14:27    Impression:  The patient has a left upper lung ground glass opacity suspicious for malignancy.  Her chest CT remains unchanged.  Plan:  Today, we discussed her recent CT. We also revisited the diagnostic options including percutaneous CT-guided biopsy. We also revisited the therapeutic options including stereotactic body radiotherapy. Each of these were discussed in detail in terms of the rationale, logistics, and effects.  We talked briefly about the potential to proceed with him empiric stereotactic body radiotherapy given the preponderance of evidence including highly suspicious biopsy and radiographic appearance  At this point, given the stability of the lung nodule, the patient does not desire to proceed with any further workup other than ongoing surveillance.  The patient is agreeable to proceed with followup chest CT in 3 months and return to radiation oncology clinic for followup with me.  _____________________________________  Marcia Woods. Marcia Woods, M.D.

## 2013-08-31 ENCOUNTER — Telehealth: Payer: Self-pay | Admitting: *Deleted

## 2013-08-31 NOTE — Telephone Encounter (Signed)
CALLED PATIENT TO INFORM OF TEST ON 12-03-13 - ARRIVAL TIME - 12:45 PM @ [facility], AND HIS [place] [facility] VISIT WITH DR. MANNING ON 12-05-13- ARRIVAL TIME - 2:30 PM , SPOKE WITH PATIENT'S SON REX AND THEY ARE AWARE OF THESE APPTS.

## 2013-08-31 NOTE — Telephone Encounter (Signed)
XXXX 

## 2013-09-11 ENCOUNTER — Telehealth: Payer: Self-pay | Admitting: Orthopedic Surgery

## 2013-09-11 NOTE — Telephone Encounter (Signed)
Patient called, states wishes to cancel appointment for re-check and Xray of right knee for 09/13/13;  I  offered re-schedule - states will call back when ready to schedule.

## 2013-09-13 ENCOUNTER — Ambulatory Visit: Payer: Medicare Other | Admitting: Orthopedic Surgery

## 2013-10-04 LAB — BASIC METABOLIC PANEL
BUN: 9 mg/dL (ref 6–23)
CALCIUM: 9.6 mg/dL (ref 8.4–10.5)
CO2: 29 mEq/L (ref 19–32)
Chloride: 102 mEq/L (ref 96–112)
Creat: 0.83 mg/dL (ref 0.50–1.10)
Glucose, Bld: 87 mg/dL (ref 70–99)
Potassium: 4.8 mEq/L (ref 3.5–5.3)
Sodium: 138 mEq/L (ref 135–145)

## 2013-10-04 LAB — CBC
HEMATOCRIT: 41.6 % (ref 36.0–46.0)
Hemoglobin: 14.2 g/dL (ref 12.0–15.0)
MCH: 30.5 pg (ref 26.0–34.0)
MCHC: 34.1 g/dL (ref 30.0–36.0)
MCV: 89.3 fL (ref 78.0–100.0)
Platelets: 221 10*3/uL (ref 150–400)
RBC: 4.66 MIL/uL (ref 3.87–5.11)
RDW: 14.3 % (ref 11.5–15.5)
WBC: 4.6 10*3/uL (ref 4.0–10.5)

## 2013-10-08 ENCOUNTER — Encounter: Payer: Self-pay | Admitting: Cardiology

## 2013-10-08 ENCOUNTER — Ambulatory Visit (INDEPENDENT_AMBULATORY_CARE_PROVIDER_SITE_OTHER): Payer: Medicare Other | Admitting: Cardiology

## 2013-10-08 VITALS — BP 133/82 | HR 99 | Ht 63.0 in | Wt 148.0 lb

## 2013-10-08 DIAGNOSIS — R911 Solitary pulmonary nodule: Secondary | ICD-10-CM

## 2013-10-08 DIAGNOSIS — I4891 Unspecified atrial fibrillation: Secondary | ICD-10-CM

## 2013-10-08 MED ORDER — DILTIAZEM HCL ER COATED BEADS 240 MG PO CP24: 240.0000 mg | ORAL_CAPSULE | Freq: Every day | ORAL | Status: DC

## 2013-10-08 MED ORDER — METOPROLOL TARTRATE 50 MG PO TABS: 50.0000 mg | ORAL_TABLET | Freq: Two times a day (BID) | ORAL | Status: DC

## 2013-10-08 MED ORDER — APIXABAN 5 MG PO TABS: 5.0000 mg | ORAL_TABLET | Freq: Two times a day (BID) | ORAL | Status: DC

## 2013-10-08 NOTE — Assessment & Plan Note (Signed)
Plan to adjust medications as an effort to improve edema by reducing Cardizem CD to 240 mg daily, and increase Lopressor to 50 mg twice daily. May need higher dose of beta blocker depending on her heart rate control and symptoms. Eliquis will be placed at 5 mg twice daily. Followup arranged in the next few months.

## 2013-10-08 NOTE — Patient Instructions (Addendum)
Your physician recommends that you schedule a follow-up appointment in: 2 months   Your physician has recommended you make the following change in your medication:    INCREASE  Eliquis to 5 mg twice a day   INCREASE  Cardizem CD to 240 mg daily   INCREASE  Lopressor to 50 mg twice a day    Thank you for choosing [facility] !

## 2013-10-08 NOTE — Assessment & Plan Note (Signed)
Continued conservative followup as noted above.

## 2013-10-08 NOTE — Progress Notes (Signed)
Clinical Summary Marcia Woods is an 80 y.o.female last seen in January of this year. She reports chronic dyspnea on exertion, no chest pain or major sense of palpitations. She is complaining of leg edema that has been worse since she has been on Cardizem CD.  Recent followup lab work in April showed hemoglobin 14.2, platelets 221, potassium 4.8, BUN 9, creatinine 0.8. She has been on Eliquis 2.5 mg BID initially. She has had no bleeding problems.  Records reviewed, she was seen in March for followup of a left upper lobe lung nodule, low grade adenocarcinoma in situ. Followup chest CT was stable at that time, and decision was to continue conservative followup for now.   Allergies  Allergen Reactions  . Codeine Nausea And Vomiting    Current Outpatient Prescriptions  Medication Sig Dispense Refill  . ALPRAZolam (XANAX) 0.5 MG tablet Take 0.25-0.5 mg by mouth at bedtime. For sleep      . cholecalciferol (VITAMIN D) 1000 UNITS tablet Take 1,000 Units by mouth daily.      Marland Kitchen docusate sodium (COLACE) 100 MG capsule Take 200 mg by mouth every evening.      Marland Kitchen ketotifen (THERA TEARS ALLERGY) 0.025 % ophthalmic solution Place 1 drop into both eyes daily as needed (for irritation).       . metoprolol tartrate (LOPRESSOR) 50 MG tablet Take 1 tablet (50 mg total) by mouth 2 (two) times daily.  180 tablet  3  . pantoprazole (PROTONIX) 40 MG tablet Take 40 mg by mouth every morning.       . pravastatin (PRAVACHOL) 40 MG tablet Take 40 mg by mouth at bedtime.       Marland Kitchen apixaban (ELIQUIS) 5 MG TABS tablet Take 1 tablet (5 mg total) by mouth 2 (two) times daily.  60 tablet  3  . diltiazem (CARDIZEM CD) 240 MG 24 hr capsule Take 1 capsule (240 mg total) by mouth daily.  90 capsule  3   No current facility-administered medications for this visit.    Past Medical History  Diagnosis Date  . Hyperlipemia   . GERD (gastroesophageal reflux disease)   . Macular degeneration   . Constipation   . Anxiety   .  Arthritis   . Vaginal atrophy   . PSVT (paroxysmal supraventricular tachycardia) 03/05/2013  . Ankle fracture 05/22/2013  . Atrial fibrillation     Social History Ms. Decelles reports that she has never smoked. She has never used smokeless tobacco. Ms. Dennen reports that she does not drink alcohol.  Review of Systems Negative except as outlined.  Physical Examination Filed Vitals:   10/08/13 1118  BP: 133/82  Pulse: 99   Filed Weights   10/08/13 1118  Weight: 148 lb (67.132 kg)    Patient appears comfortable at rest.  HEENT: Conjunctiva and lids normal, oropharynx clear.  Neck: Supple, no elevated JVP or carotid bruits, no thyromegaly.  Lungs: Diminished breath sounds but clear to auscultation, nonlabored breathing at rest.  Cardiac: Irregularly irregular, no S3, no pericardial rub.  Abdomen: Soft, nontender,bowel sounds present, no guarding or rebound.  Extremities: No pitting edema, distal pulses 1-2+.    Problem List and Plan   Atrial fibrillation Plan to adjust medications as an effort to improve edema by reducing Cardizem CD to 240 mg daily, and increase Lopressor to 50 mg twice daily. May need higher dose of beta blocker depending on her heart rate control and symptoms. Eliquis will be placed at 5 mg twice daily.  Followup arranged in the next few months.  Solitary lung nodule Continued conservative followup as noted above.    Satira Sark, M.D., F.A.C.C.

## 2013-11-01 ENCOUNTER — Telehealth: Payer: Self-pay | Admitting: *Deleted

## 2013-11-01 NOTE — Telephone Encounter (Signed)
Pt states she was told at last office visit we would check into getting her help with a blood thinner Rx she is taking. She has never heard back about it. She is paying $70 a month and states she con not afford it.

## 2013-11-01 NOTE — Telephone Encounter (Signed)
Made pt aware. Investigated and pt has medicaid part D, this is the best price she can get with her insurance.

## 2013-11-02 ENCOUNTER — Telehealth: Payer: Self-pay | Admitting: *Deleted

## 2013-11-02 NOTE — Telephone Encounter (Signed)
Pt states that she is wanting samples. Samples at front desk for pt to pick up. Pt states she will not call all the time for samples, but would just like a little help about every 3-4 months.

## 2013-11-02 NOTE — Telephone Encounter (Signed)
PT IS CALLING BACK ABOUT SAMPLES OF BLOOD THINNERS. YESTERDAY WE CALLED BACK AND SPOKE WITH DAUGHTER IN LAW. SHE WANTS TO TALK TO [place] [phone]

## 2013-12-03 ENCOUNTER — Ambulatory Visit (HOSPITAL_COMMUNITY)
Admission: RE | Admit: 2013-12-03 | Discharge: 2013-12-03 | Disposition: A | Discharge status: Home / Self Care | Payer: Medicare Other | Source: Ambulatory Visit | Attending: Radiation Oncology | Admitting: Radiation Oncology

## 2013-12-03 ENCOUNTER — Encounter (HOSPITAL_COMMUNITY): Payer: Self-pay

## 2013-12-03 DIAGNOSIS — S32009B Unspecified fracture of unspecified lumbar vertebra, initial encounter for open fracture: Secondary | ICD-10-CM | POA: Insufficient documentation

## 2013-12-03 DIAGNOSIS — X58XXXA Exposure to other specified factors, initial encounter: Secondary | ICD-10-CM | POA: Insufficient documentation

## 2013-12-03 DIAGNOSIS — I517 Cardiomegaly: Secondary | ICD-10-CM | POA: Insufficient documentation

## 2013-12-03 DIAGNOSIS — R911 Solitary pulmonary nodule: Secondary | ICD-10-CM | POA: Insufficient documentation

## 2013-12-03 DIAGNOSIS — Y929 Unspecified place or not applicable: Secondary | ICD-10-CM | POA: Insufficient documentation

## 2013-12-04 ENCOUNTER — Encounter: Payer: Self-pay | Admitting: Radiation Oncology

## 2013-12-04 ENCOUNTER — Telehealth: Payer: Self-pay | Admitting: *Deleted

## 2013-12-04 NOTE — Progress Notes (Signed)
Thoracic Location of Tumor / Histology:4.1 cm left upper lung sub-solid nodule low-grade adenocarcinoma in situ, formally referred to as bronchoalveolar carcinoma.  Dr. Tammi Klippel originally met patient on 06/28/2013.   Biopsies of left upper lobe lung mass revealed: minute focus of highly atypical epithelium  Tobacco/Marijuana/Snuff/ETOH use: Never  Past/Anticipated interventions by cardiothoracic surgery, if any: Dr. Servando Snare recommended surgical resection back in December of 2014 but, patient wasn't at all interested.   Past/Anticipated interventions by medical oncology, if any: None  Signs/Symptoms  Weight changes, if any: None noted  Respiratory complaints, if any: Dyspnea  Hemoptysis, if any: None noted  Pain issues, if any:  None noted  SAFETY ISSUES:  Prior radiation? NO  Pacemaker/ICD? NO  Possible current pregnancy? NO  Is the patient on methotrexate? NO  Current Complaints / other details:  80 year old female. Agreeable to active surveillance. Returning today to review stable chest CT report from 12/03/2013.

## 2013-12-04 NOTE — Telephone Encounter (Signed)
Called patient to inform of test and fu visit  For December 2015, spoke with patient and she is aware of these appts.

## 2013-12-04 NOTE — Progress Notes (Signed)
  Radiation Oncology         [phone] ________________________________  Name: Marcia Woods MRN: [id number]  Date: 12/05/2013  DOB: 03/30/1926  Telephone contact:  I reviewed the Chest CT performed yesterday and called the patient to review the result.  The sub-solid left upper lung nodule which we suspect to be a slow growing cancerous process has not changed since March.  We discussed the implications of this.  I would still recommend CT-guided percutaneous biopsy and SBRT, but, the patient would prefer to continue surveillance.  I cancelled her follow-up visit tomorrow.  I will set up chest CT in 6 months and then follow-up with her.  ________________________________  Sheral Apley. Tammi Klippel, M.D.

## 2013-12-05 ENCOUNTER — Ambulatory Visit
Admission: RE | Admit: 2013-12-05 | Discharge: 2013-12-05 | Disposition: A | Discharge status: Home / Self Care | Payer: Medicare Other | Source: Ambulatory Visit | Attending: Radiation Oncology | Admitting: Radiation Oncology

## 2013-12-05 ENCOUNTER — Ambulatory Visit: Payer: Medicare Other

## 2013-12-05 DIAGNOSIS — R911 Solitary pulmonary nodule: Secondary | ICD-10-CM

## 2013-12-24 ENCOUNTER — Ambulatory Visit (INDEPENDENT_AMBULATORY_CARE_PROVIDER_SITE_OTHER): Payer: Medicare Other | Admitting: Cardiology

## 2013-12-24 ENCOUNTER — Encounter: Payer: Self-pay | Admitting: Cardiology

## 2013-12-24 VITALS — BP 136/87 | HR 81 | Ht 64.0 in | Wt 151.8 lb

## 2013-12-24 DIAGNOSIS — I4891 Unspecified atrial fibrillation: Secondary | ICD-10-CM

## 2013-12-24 DIAGNOSIS — I482 Chronic atrial fibrillation, unspecified: Secondary | ICD-10-CM

## 2013-12-24 DIAGNOSIS — I471 Supraventricular tachycardia: Secondary | ICD-10-CM

## 2013-12-24 DIAGNOSIS — R609 Edema, unspecified: Secondary | ICD-10-CM

## 2013-12-24 DIAGNOSIS — R600 Localized edema: Secondary | ICD-10-CM | POA: Insufficient documentation

## 2013-12-24 MED ORDER — FUROSEMIDE 20 MG PO TABS: 20.0000 mg | ORAL_TABLET | Freq: Every day | ORAL | Status: DC

## 2013-12-24 NOTE — Progress Notes (Signed)
Clinical Summary Marcia Woods is an 80 y.o.female last seen in April of this year. Medications were adjusted at that time including reduction of Cardizem CD 240 mg daily with increasing Lopressor to 50 mg twice daily. She was having trouble with leg edema and the thought was that by reducing her calcium channel blocker this might improve. She has not had much improvement at this point.  ECG today shows atrial fibrillation at 88 beats per minute, decreased R-wave progression, nonspecific T-wave changes.  Lab work from April showed hemoglobin 14.2, platelets 221, potassium 4.8, BUN 9, creatinine 0.8.  We discussed trying a low-dose diuretic.   Allergies  Allergen Reactions  . Codeine Nausea And Vomiting    Current Outpatient Prescriptions  Medication Sig Dispense Refill  . ALPRAZolam (XANAX) 0.5 MG tablet Take 0.25-0.5 mg by mouth at bedtime. For sleep      . apixaban (ELIQUIS) 5 MG TABS tablet Take 1 tablet (5 mg total) by mouth 2 (two) times daily.  60 tablet  3  . cholecalciferol (VITAMIN D) 1000 UNITS tablet Take 1,000 Units by mouth daily.      Marland Kitchen diltiazem (CARDIZEM CD) 240 MG 24 hr capsule Take 1 capsule (240 mg total) by mouth daily.  90 capsule  3  . docusate sodium (COLACE) 100 MG capsule Take 200 mg by mouth every evening.      Marland Kitchen ketotifen (THERA TEARS ALLERGY) 0.025 % ophthalmic solution Place 1 drop into both eyes daily as needed (for irritation).       . metoprolol tartrate (LOPRESSOR) 50 MG tablet Take 1 tablet (50 mg total) by mouth 2 (two) times daily.  180 tablet  3  . pantoprazole (PROTONIX) 40 MG tablet Take 40 mg by mouth every morning.       . pravastatin (PRAVACHOL) 40 MG tablet Take 40 mg by mouth at bedtime.       . furosemide (LASIX) 20 MG tablet Take 1 tablet (20 mg total) by mouth daily.  90 tablet  3   No current facility-administered medications for this visit.    Past Medical History  Diagnosis Date  . Hyperlipemia   . GERD (gastroesophageal reflux  disease)   . Macular degeneration   . Constipation   . Anxiety   . Arthritis   . Vaginal atrophy   . PSVT (paroxysmal supraventricular tachycardia) 03/05/2013  . Ankle fracture 05/22/2013  . Atrial fibrillation     Social History Marcia Woods reports that she has never smoked. She has never used smokeless tobacco. Marcia Woods reports that she does not drink alcohol.  Review of Systems No chest pain. Intermittent sense of palpitations. No bleeding episodes. Other systems reviewed and negative.  Physical Examination Filed Vitals:   12/24/13 1134  BP: 136/87  Pulse: 81   Filed Weights   12/24/13 1134  Weight: 151 lb 12.8 oz (68.856 kg)    Patient appears comfortable at rest.  HEENT: Conjunctiva and lids normal, oropharynx clear.  Neck: Supple, no elevated JVP or carotid bruits, no thyromegaly.  Lungs: Diminished breath sounds but clear to auscultation, nonlabored breathing at rest.  Cardiac: Irregularly irregular, no S3, no pericardial rub.  Abdomen: Soft, nontender,bowel sounds present, no guarding or rebound.  Extremities: No pitting edema, distal pulses 1-2+.    Problem List and Plan   Leg edema Continue current regimen, add Lasix 20 mg daily. She might not need to take this every day long-term however. Followup BMET in the next few weeks to  determine if she needs a potassium supplement.  Atrial fibrillation Continue Eliquis, Cardizem CD, and Lopressor.    Satira Sark, M.D., F.A.C.C.

## 2013-12-24 NOTE — Assessment & Plan Note (Signed)
Continue current regimen, add Lasix 20 mg daily. She might not need to take this every day long-term however. Followup BMET in the next few weeks to determine if she needs a potassium supplement.

## 2013-12-24 NOTE — Assessment & Plan Note (Signed)
Continue Eliquis, Cardizem CD, and Lopressor.

## 2013-12-24 NOTE — Patient Instructions (Signed)
Your physician recommends that you schedule a follow-up appointment in: 3 months with Dr.McDowell      Your physician has recommended you make the following change in your medication:       START Lasix 20 mg daily  Please get blood work in 3 weeks (BMET)     Thank you for choosing [facility] !

## 2014-01-16 LAB — BASIC METABOLIC PANEL
BUN: 11 mg/dL (ref 6–23)
CALCIUM: 9.7 mg/dL (ref 8.4–10.5)
CO2: 25 mEq/L (ref 19–32)
Chloride: 101 mEq/L (ref 96–112)
Creat: 0.91 mg/dL (ref 0.50–1.10)
Glucose, Bld: 89 mg/dL (ref 70–99)
Potassium: 4.3 mEq/L (ref 3.5–5.3)
SODIUM: 138 meq/L (ref 135–145)

## 2014-01-22 IMAGING — CR DG CHEST 1V PORT
1 series · 1 of 1 positions shown · non-contrast
Comparison: Chest x-ray of June 08, 2013.

CLINICAL DATA: Follow-up bronchoscopy

EXAM:
PORTABLE CHEST - 1 VIEW

[AP]
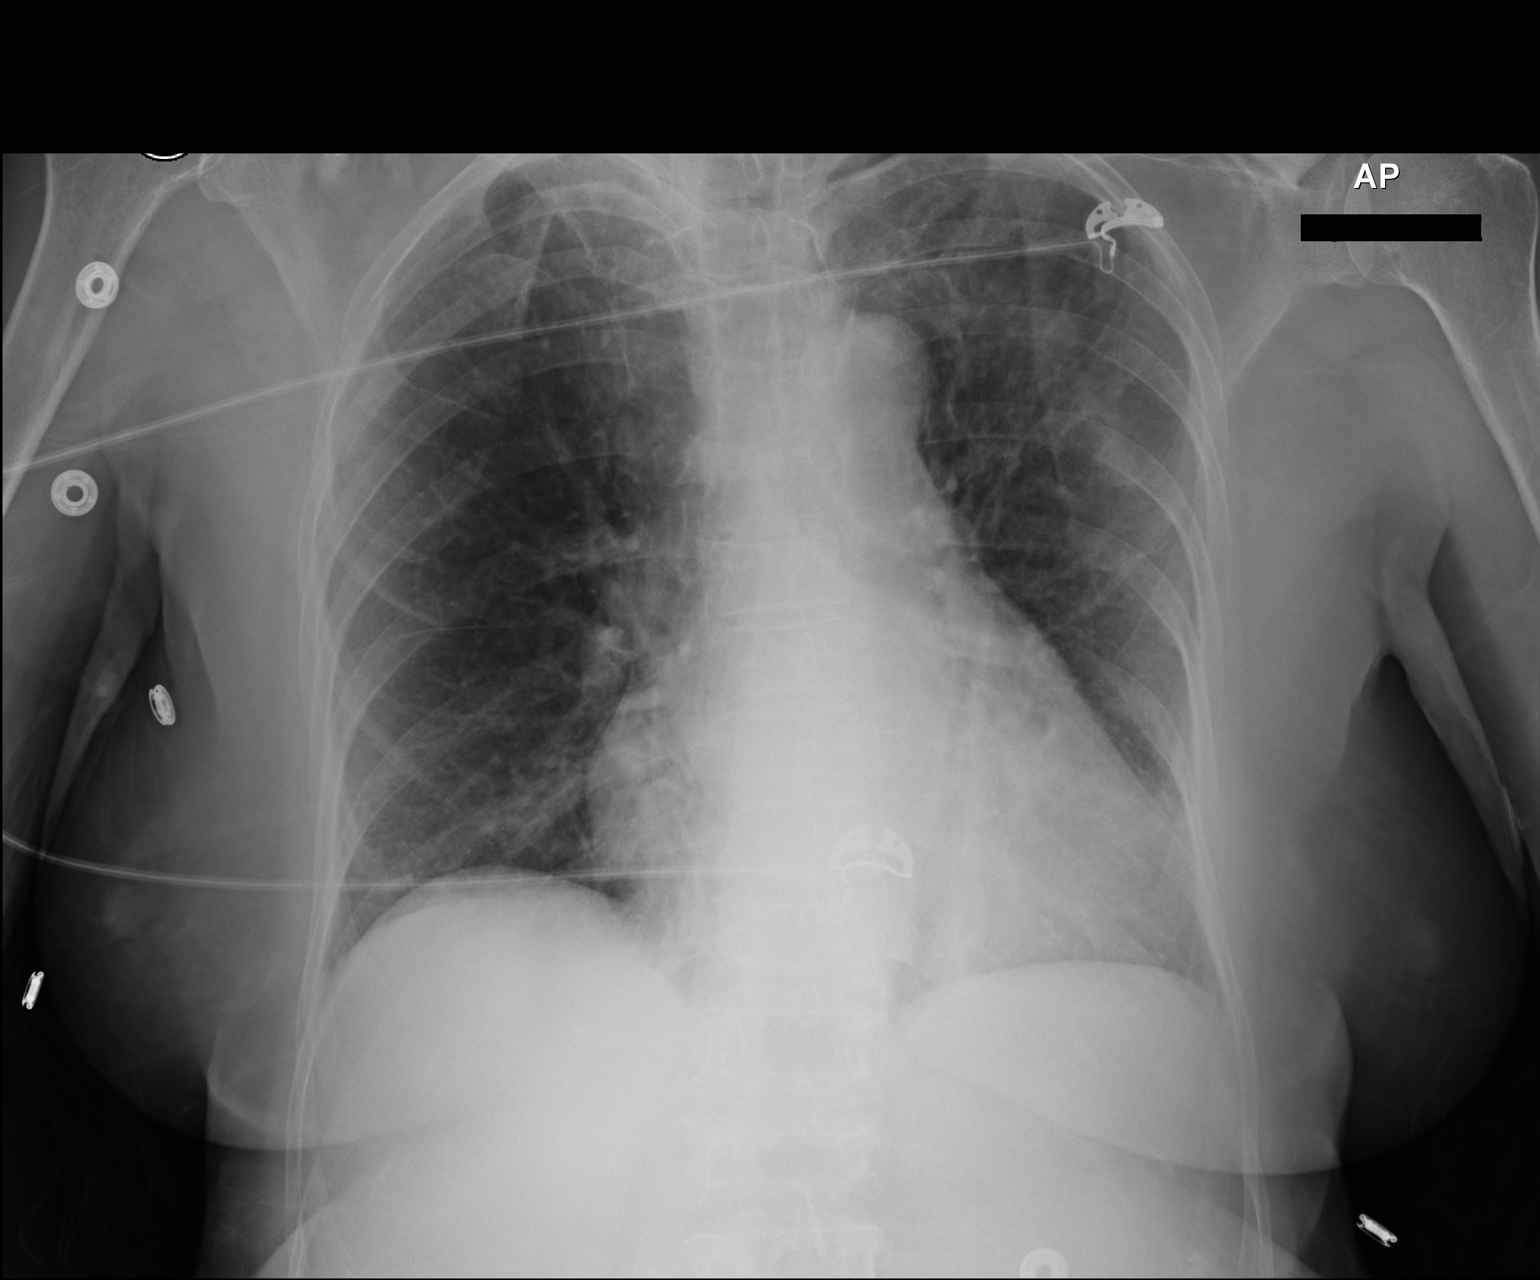

[1 of 1 positions shown; findings below may reference images not displayed]

FINDINGS: The lungs are well-expanded. No pneumothorax is demonstrated.
Minimally increased density in the left upper lobe is noted which
may reflect subsegmental atelectasis. The cardiopericardial
silhouette is mildly enlarged though stable. The pulmonary
vascularity is not engorged. There is no pleural effusion. The
observed portions of the bony thorax appear normal.
IMPRESSION: 1. There is no evidence of a post procedure pneumothorax or pleural
effusion.
2. Minimal density in the left upper lobe likely reflects
subsegmental atelectasis.
3. Mild enlargement of the cardiopericardial silhouette is
demonstrated but there is no pulmonary vascular congestion.

## 2014-01-29 ENCOUNTER — Ambulatory Visit (INDEPENDENT_AMBULATORY_CARE_PROVIDER_SITE_OTHER): Payer: Medicare Other | Admitting: Adult Health

## 2014-01-29 ENCOUNTER — Encounter: Payer: Self-pay | Admitting: Adult Health

## 2014-01-29 VITALS — BP 142/84 | Ht 63.0 in | Wt 146.0 lb

## 2014-01-29 DIAGNOSIS — N9489 Other specified conditions associated with female genital organs and menstrual cycle: Secondary | ICD-10-CM

## 2014-01-29 DIAGNOSIS — N898 Other specified noninflammatory disorders of vagina: Secondary | ICD-10-CM

## 2014-01-29 NOTE — Progress Notes (Signed)
Subjective:     Patient ID: Marcia Woods, female   DOB: 1925-12-27, 80 y.o.   MRN: [id number]  HPI Marcia Woods is a 80 year old white female in complaining of vaginal odor.She denies any itching or burning or discharge.She is going on 7 day cruise in September and "does not want to smell."She is not having sex.  Review of Systems See HPI Reviewed past medical,surgical, social and family history. Reviewed medications and allergies.     Objective:   Physical Exam BP 142/84  Ht 5\' 3"  (1.6 m)  Wt 146 lb (66.225 kg)  BMI 25.87 kg/m2   Skin warm and dry.Pelvic: external genitalia has no lesions,skin thin, vagina: has loss of color,moisture and rugae, no discharge,the cervix and uterus are absent, adnexa: no masses or tenderness noted.No odor was noted. Discussed that after menopause can get odor, with changes in ph, will try luvena.  Assessment:     Vaginal odor    Plan:     Try luvena Follow up prn

## 2014-01-29 NOTE — Patient Instructions (Signed)
Try luvena  follow up prn

## 2014-03-07 ENCOUNTER — Telehealth: Payer: Self-pay | Admitting: *Deleted

## 2014-03-07 NOTE — Telephone Encounter (Signed)
Pt needs samples of eliquis she can not afford to fill them this month

## 2014-03-07 NOTE — Telephone Encounter (Signed)
Pt given 1 month samples eliquis

## 2014-03-27 ENCOUNTER — Encounter: Payer: Self-pay | Admitting: Cardiology

## 2014-03-27 ENCOUNTER — Ambulatory Visit (INDEPENDENT_AMBULATORY_CARE_PROVIDER_SITE_OTHER): Payer: Medicare Other | Admitting: Cardiology

## 2014-03-27 VITALS — BP 138/88 | HR 80 | Ht 64.0 in | Wt 145.0 lb

## 2014-03-27 DIAGNOSIS — I4891 Unspecified atrial fibrillation: Secondary | ICD-10-CM

## 2014-03-27 DIAGNOSIS — R600 Localized edema: Secondary | ICD-10-CM

## 2014-03-27 NOTE — Patient Instructions (Signed)
Your physician wants you to follow-up in: 4 months with Dr. Ferne Reus will receive a reminder letter in the mail two months in advance. If you don't receive a letter, please call our office to schedule the follow-up appointment.  Your physician recommends that you return for lab work just before your next appointment BMP/CBC  Thank you for choosing [facility]!!

## 2014-03-27 NOTE — Progress Notes (Signed)
    Clinical Summary Ms. Marcia Woods is an 80 y.o.female last seen in July. She is here with a friend. She does tell me that leg edema improved by using Lasix. No sense of palpitations. She occasionally feels somewhat dizzy. She was able to go on a seven-day cruise to the [place] with her family, got back recently.  Lab work in July showed potassium 4.3, BUN 11, creatinine 0.9. This was after starting Lasix.  She denies any spontaneous bleeding problems.   Allergies  Allergen Reactions  . Codeine Nausea And Vomiting    Current Outpatient Prescriptions  Medication Sig Dispense Refill  . ALPRAZolam (XANAX) 0.5 MG tablet Take 0.25-0.5 mg by mouth at bedtime. For sleep      . apixaban (ELIQUIS) 5 MG TABS tablet Take 1 tablet (5 mg total) by mouth 2 (two) times daily.  60 tablet  3  . cholecalciferol (VITAMIN D) 1000 UNITS tablet Take 1,000 Units by mouth daily.      Marland Kitchen diltiazem (CARDIZEM CD) 240 MG 24 hr capsule Take 1 capsule (240 mg total) by mouth daily.  90 capsule  3  . docusate sodium (COLACE) 100 MG capsule Take 200 mg by mouth every evening.      . furosemide (LASIX) 20 MG tablet Take 1 tablet (20 mg total) by mouth daily.  90 tablet  3  . ketotifen (THERA TEARS ALLERGY) 0.025 % ophthalmic solution Place 1 drop into both eyes daily as needed (for irritation).       . metoprolol tartrate (LOPRESSOR) 50 MG tablet Take 1 tablet (50 mg total) by mouth 2 (two) times daily.  180 tablet  3  . pantoprazole (PROTONIX) 40 MG tablet Take 40 mg by mouth every morning.       . pravastatin (PRAVACHOL) 40 MG tablet Take 40 mg by mouth at bedtime.        No current facility-administered medications for this visit.    Past Medical History  Diagnosis Date  . Hyperlipemia   . GERD (gastroesophageal reflux disease)   . Macular degeneration   . Constipation   . Anxiety   . Arthritis   . Vaginal atrophy   . PSVT (paroxysmal supraventricular tachycardia) 03/05/2013  . Ankle fracture 05/22/2013  .  Atrial fibrillation   . Vaginal odor 01/29/2014    Social History Ms. Shiffman reports that she has never smoked. She has never used smokeless tobacco. Ms. Flott reports that she does not drink alcohol.  Review of Systems Stable NYHA class II dyspnea. Other systems reviewed and negative except as outlined.  Physical Examination Filed Vitals:   03/27/14 1201  BP: 138/88  Pulse: 80   Filed Weights   03/27/14 1201  Weight: 145 lb (65.772 kg)    Patient appears comfortable at rest.  HEENT: Conjunctiva and lids normal, oropharynx clear.  Neck: Supple, no elevated JVP or carotid bruits, no thyromegaly.  Lungs: Diminished breath sounds but clear to auscultation, nonlabored breathing at rest.  Cardiac: Irregularly irregular, no S3, no pericardial rub.  Abdomen: Soft, nontender,bowel sounds present, no guarding or rebound.  Extremities: No pitting edema, distal pulses 1-2+.    Problem List and Plan   Atrial fibrillation Continue current medical regimen including Cardizem CD, Lopressor, and Eliquis. Followup CBC in BMET for next visit in 4 months.  Leg edema Improved with Lasix.    Satira Sark, M.D., F.A.C.C.

## 2014-03-27 NOTE — Assessment & Plan Note (Addendum)
Continue current medical regimen including Cardizem CD, Lopressor, and Eliquis. Followup CBC in BMET for next visit in 4 months.

## 2014-03-27 NOTE — Assessment & Plan Note (Signed)
Improved with Lasix.

## 2014-04-17 ENCOUNTER — Telehealth: Payer: Self-pay | Admitting: Cardiology

## 2014-04-17 NOTE — Telephone Encounter (Signed)
Pt notified we have no samples currently. I have left message with rep asking for more samples

## 2014-04-17 NOTE — Telephone Encounter (Signed)
Samples of Eliquis / tgs

## 2014-04-22 ENCOUNTER — Encounter: Payer: Self-pay | Admitting: Cardiology

## 2014-05-01 ENCOUNTER — Telehealth: Payer: Self-pay | Admitting: Cardiovascular Disease

## 2014-05-01 NOTE — Telephone Encounter (Signed)
Pt aware samples ready to be picked up

## 2014-05-01 NOTE — Telephone Encounter (Signed)
Patient calling for samples of Eliquis / tgs

## 2014-05-07 ENCOUNTER — Telehealth: Payer: Self-pay | Admitting: Adult Health

## 2014-05-07 ENCOUNTER — Other Ambulatory Visit: Payer: Self-pay

## 2014-05-07 ENCOUNTER — Telehealth: Payer: Self-pay | Admitting: *Deleted

## 2014-05-07 ENCOUNTER — Encounter: Payer: Self-pay | Admitting: Cardiology

## 2014-05-07 NOTE — Telephone Encounter (Signed)
Received labs in DR. McDowell's folder

## 2014-05-07 NOTE — Telephone Encounter (Signed)
Pt in doughnut whole,wil give pt 1 month samples

## 2014-05-07 NOTE — Telephone Encounter (Signed)
[facility] investigating

## 2014-05-07 NOTE — Telephone Encounter (Signed)
Patient states that she was given coupon for 30 days of Eliquis / states that pharmacy will not take it / wants to know why / tgs

## 2014-06-05 ENCOUNTER — Encounter: Payer: Self-pay | Admitting: Radiation Therapy

## 2014-06-05 ENCOUNTER — Ambulatory Visit (HOSPITAL_COMMUNITY)
Admission: RE | Admit: 2014-06-05 | Discharge: 2014-06-05 | Disposition: A | Discharge status: Home / Self Care | Payer: Medicare Other | Source: Ambulatory Visit | Attending: Radiation Oncology | Admitting: Radiation Oncology

## 2014-06-05 DIAGNOSIS — I517 Cardiomegaly: Secondary | ICD-10-CM | POA: Diagnosis not present

## 2014-06-05 DIAGNOSIS — I700 Atherosclerosis of aorta: Secondary | ICD-10-CM | POA: Diagnosis not present

## 2014-06-05 DIAGNOSIS — I2510 Atherosclerotic heart disease of native coronary artery without angina pectoris: Secondary | ICD-10-CM | POA: Insufficient documentation

## 2014-06-05 DIAGNOSIS — R911 Solitary pulmonary nodule: Secondary | ICD-10-CM

## 2014-06-05 DIAGNOSIS — K753 Granulomatous hepatitis, not elsewhere classified: Secondary | ICD-10-CM | POA: Insufficient documentation

## 2014-06-05 DIAGNOSIS — K449 Diaphragmatic hernia without obstruction or gangrene: Secondary | ICD-10-CM | POA: Insufficient documentation

## 2014-06-05 DIAGNOSIS — J984 Other disorders of lung: Secondary | ICD-10-CM | POA: Diagnosis not present

## 2014-06-05 NOTE — Progress Notes (Signed)
Marcia Woods called to cancel her follow-up appointment with Dr. Tammi Klippel scheduled for 12/17. She has a CT chest scheduled for 12/16 but rather than come in for her results, she said that Dr. Tammi Klippel normally calls her to save her the drive to [place]. I have cancelled this appointment and will route this message to both Dr. Tammi Klippel and his nurse Sam to keep an eye out for the scan report.     [facility] R[facility] (R[facility] (T[facility] [place] [phone] Office [phone] Pager [phone] Fax [email or web address]

## 2014-06-06 ENCOUNTER — Other Ambulatory Visit: Payer: Self-pay | Admitting: Radiation Oncology

## 2014-06-06 ENCOUNTER — Ambulatory Visit: Payer: Medicare Other | Admitting: Radiation Oncology

## 2014-06-06 DIAGNOSIS — R911 Solitary pulmonary nodule: Secondary | ICD-10-CM

## 2014-06-07 ENCOUNTER — Telehealth: Payer: Self-pay | Admitting: Radiation Oncology

## 2014-06-07 ENCOUNTER — Telehealth: Payer: Self-pay | Admitting: *Deleted

## 2014-06-07 NOTE — Telephone Encounter (Signed)
Phoned patient at home per Dr. Johny Shears order and explained that the area of concern still remains unchanged and that Enid Derry will call with her appointment for a CT scan in six months. Patient verbalized understanding and expressed appreciation for the call.

## 2014-06-07 NOTE — Telephone Encounter (Signed)
-----   Message from Lora Paula, MD sent at 06/06/2014  5:37 PM EST ----- OK, let her know the area of concern still remains unchanged, so, we can repeat CT in 6 months.  MM   ----- Message -----    From: Heywood Footman, RN    Sent: 06/06/2014  10:56 AM      To: Lora Paula, MD  You did have a follow up with her but, she cancelled it. If I recall correctly she is the lady that said, "I don't need an appointment because he always call my niece." I will be glad to call her with results. What do you want me to say? Sam ----- Message -----    From: Lora Paula, MD    Sent: 06/05/2014   7:12 PM      To: Heywood Footman, RN  Do I have a follow-up with her?

## 2014-06-07 NOTE — Telephone Encounter (Signed)
CALLED PATIENT TO INFORM OF TEST FOR 12-03-14 @ 11 AM @ [facility], SPOKE WITH PATIENT AND SHE IS AWARE OF THIS TEST

## 2014-06-10 ENCOUNTER — Other Ambulatory Visit (HOSPITAL_COMMUNITY): Payer: Self-pay | Admitting: Internal Medicine

## 2014-06-10 ENCOUNTER — Ambulatory Visit (HOSPITAL_COMMUNITY)
Admission: RE | Admit: 2014-06-10 | Discharge: 2014-06-10 | Disposition: A | Discharge status: Home / Self Care | Payer: Medicare Other | Source: Ambulatory Visit | Attending: Internal Medicine | Admitting: Internal Medicine

## 2014-06-10 DIAGNOSIS — R05 Cough: Secondary | ICD-10-CM

## 2014-06-10 DIAGNOSIS — R918 Other nonspecific abnormal finding of lung field: Secondary | ICD-10-CM | POA: Diagnosis not present

## 2014-06-10 DIAGNOSIS — R059 Cough, unspecified: Secondary | ICD-10-CM

## 2014-06-10 DIAGNOSIS — R0602 Shortness of breath: Secondary | ICD-10-CM | POA: Insufficient documentation

## 2014-06-10 DIAGNOSIS — I517 Cardiomegaly: Secondary | ICD-10-CM | POA: Insufficient documentation

## 2014-06-10 DIAGNOSIS — Z1231 Encounter for screening mammogram for malignant neoplasm of breast: Secondary | ICD-10-CM

## 2014-06-17 ENCOUNTER — Ambulatory Visit (HOSPITAL_COMMUNITY)
Admission: RE | Admit: 2014-06-17 | Discharge: 2014-06-17 | Disposition: A | Discharge status: Home / Self Care | Payer: Medicare Other | Source: Ambulatory Visit | Attending: Internal Medicine | Admitting: Internal Medicine

## 2014-06-17 DIAGNOSIS — Z1231 Encounter for screening mammogram for malignant neoplasm of breast: Secondary | ICD-10-CM | POA: Insufficient documentation

## 2014-07-26 ENCOUNTER — Ambulatory Visit (INDEPENDENT_AMBULATORY_CARE_PROVIDER_SITE_OTHER): Payer: PPO | Admitting: Cardiology

## 2014-07-26 ENCOUNTER — Encounter: Payer: Self-pay | Admitting: Cardiology

## 2014-07-26 VITALS — BP 108/76 | HR 88 | Ht 64.0 in | Wt 149.0 lb

## 2014-07-26 DIAGNOSIS — I482 Chronic atrial fibrillation, unspecified: Secondary | ICD-10-CM

## 2014-07-26 DIAGNOSIS — E782 Mixed hyperlipidemia: Secondary | ICD-10-CM

## 2014-07-26 DIAGNOSIS — R600 Localized edema: Secondary | ICD-10-CM

## 2014-07-26 NOTE — Assessment & Plan Note (Signed)
Labwork from November 2015 showed LDL 81. She continues on Pravachol.

## 2014-07-26 NOTE — Assessment & Plan Note (Signed)
Persistent, continue strategy of heart rate control and anticoagulation, no changes made to current regimen. We will follow-up with CBC and BMET in 3 months.

## 2014-07-26 NOTE — Patient Instructions (Addendum)
Your physician recommends that you schedule a follow-up appointment in: 3 months with Dr Domenic Polite  Your physician recommends that you continue on your current medications as directed. Please refer to the Current Medication list given to you today.   PLEASE GET LAB WORK 1 WEEK BEFORE YOUR NEXT VISIT   (BMET, CBC)   Thank you for choosing [facility] !

## 2014-07-26 NOTE — Progress Notes (Signed)
Cardiology Office Note  Date: 07/26/2014   ID: Marcia Woods, DOB 29-Nov-1925, MRN [id number]  PCP: [name], MD  Primary Cardiologist: Rozann Lesches, MD   Chief Complaint  Patient presents with  . Atrial Fibrillation    History of Present Illness: Marcia Woods is an 81 y.o. female last seen in October 2015. She is here for a routine visit today. Reports no palpitations or chest pain. I reviewed her medications which include Eliquis, Cardizem CD, and Lopressor. She denies any obvious problems.  Follow-up lab work from November 2015 noted below.  She states that she is undergoing evaluation for right knee arthritis by Dr. Aline Brochure, not yet decided whether she will need surgery or not.   Past Medical History  Diagnosis Date  . Hyperlipemia   . GERD (gastroesophageal reflux disease)   . Macular degeneration   . Constipation   . Anxiety   . Arthritis   . Vaginal atrophy   . PSVT (paroxysmal supraventricular tachycardia) 03/05/2013  . Ankle fracture 05/22/2013  . Atrial fibrillation   . Vaginal odor 01/29/2014    Current Outpatient Prescriptions  Medication Sig Dispense Refill  . ALPRAZolam (XANAX) 0.5 MG tablet Take 0.25-0.5 mg by mouth at bedtime. For sleep    . apixaban (ELIQUIS) 5 MG TABS tablet Take 1 tablet (5 mg total) by mouth 2 (two) times daily. 60 tablet 3  . cholecalciferol (VITAMIN D) 1000 UNITS tablet Take 1,000 Units by mouth daily.    Marland Kitchen diltiazem (CARDIZEM CD) 240 MG 24 hr capsule Take 1 capsule (240 mg total) by mouth daily. 90 capsule 3  . docusate sodium (COLACE) 100 MG capsule Take 200 mg by mouth every evening.    . furosemide (LASIX) 20 MG tablet Take 1 tablet (20 mg total) by mouth daily. 90 tablet 3  . ketotifen (THERA TEARS ALLERGY) 0.025 % ophthalmic solution Place 1 drop into both eyes daily as needed (for irritation).     . metoprolol tartrate (LOPRESSOR) 50 MG tablet Take 1 tablet (50 mg total) by mouth 2 (two) times daily. 180 tablet 3  .  pantoprazole (PROTONIX) 40 MG tablet Take 40 mg by mouth every morning.     . pravastatin (PRAVACHOL) 40 MG tablet Take 40 mg by mouth at bedtime.      No current facility-administered medications for this visit.    Allergies:  Codeine   Social History: The patient  reports that she has never smoked. She has never used smokeless tobacco. She reports that she does not drink alcohol or use illicit drugs.   ROS:  Please see the history of present illness. Otherwise, complete review of systems is positive for none.  All other systems are reviewed and negative.    Physical Exam: VS:  BP 108/76 mmHg  Pulse 88  Ht 5\' 4"  (1.626 m)  Wt 149 lb (67.586 kg)  BMI 25.56 kg/m2  SpO2 99%, BMI Body mass index is 25.56 kg/(m^2).  Wt Readings from Last 3 Encounters:  07/26/14 149 lb (67.586 kg)  03/27/14 145 lb (65.772 kg)  01/29/14 146 lb (66.225 kg)     Patient appears comfortable at rest.  HEENT: Conjunctiva and lids normal, oropharynx clear.  Neck: Supple, no elevated JVP or carotid bruits, no thyromegaly.  Lungs: Diminished breath sounds but clear to auscultation, nonlabored breathing at rest.  Cardiac: Irregularly irregular, no S3, no pericardial rub.  Abdomen: Soft, nontender,bowel sounds present, no guarding or rebound.  Extremities: No pitting edema, distal pulses  1-2+.    ECG: ECG is not ordered today.  Recent Labwork:  Follow-up lab work in November 2015 showed hemoglobin 14.1, platelets 209, potassium 4.6, BUN 12, current 0.8, AST 29, ALT 25, cholesterol 175, triglycerides 73, HDL 79, and LDL 81.  Other Studies Reviewed Today:   ASSESSMENT AND PLAN:  Atrial fibrillation Persistent, continue strategy of heart rate control and anticoagulation, no changes made to current regimen. We will follow-up with CBC and BMET in 3 months.   Mixed hyperlipidemia Labwork from November 2015 showed LDL 81. She continues on Pravachol.     Current medicines are reviewed at length  with the patient today.  The patient does not have concerns regarding medicines.   Orders Placed This Encounter  Procedures  . CBC  . Basic Metabolic Panel (BMET)    Disposition: FU with me in 3 months.   Signed, Satira Sark, MD, [facility] 07/26/2014 11:08 AM    [facility] [address], [place], [place] [address] Phone: [phone]; Fax: [phone]

## 2014-08-01 ENCOUNTER — Ambulatory Visit (INDEPENDENT_AMBULATORY_CARE_PROVIDER_SITE_OTHER): Payer: PPO | Admitting: Orthopedic Surgery

## 2014-08-01 ENCOUNTER — Encounter: Payer: Self-pay | Admitting: Orthopedic Surgery

## 2014-08-01 ENCOUNTER — Ambulatory Visit (INDEPENDENT_AMBULATORY_CARE_PROVIDER_SITE_OTHER): Payer: PPO

## 2014-08-01 VITALS — BP 139/101 | Ht 64.0 in | Wt 149.0 lb

## 2014-08-01 DIAGNOSIS — M25561 Pain in right knee: Secondary | ICD-10-CM

## 2014-08-01 DIAGNOSIS — M25562 Pain in left knee: Secondary | ICD-10-CM

## 2014-08-01 NOTE — Patient Instructions (Signed)
We will order orthovisc for right knee

## 2014-08-01 NOTE — Progress Notes (Signed)
Patient ID: Marcia Woods, female   DOB: [birth year], 81 y.o.   MRN: [id number] Patient ID: Marcia Woods, female   DOB: 10-30-1925, 81 y.o.   MRN: [id number]  Chief Complaint  Patient presents with  . Knee Pain    re-eval right knee pain   also complains of left knee pain status post left knee arthroscopy back in 2010  HPI Marcia Woods is a 81 y.o. female.  Now 81 years old presents for evaluation of both knees right and left complains of bilateral knee pain right worse than left left on occasion. She does report right knee stiffness but she can do most activities of daily living such as climbing stairs getting in and out of a car and sitting on the toilet.  No new trauma.  HPI   Review of Systems Review of Systems  Review of systems neurologic symptoms negative constitutional symptoms negative musculoskeletal system no other issues Past Medical History  Diagnosis Date  . Hyperlipemia   . GERD (gastroesophageal reflux disease)   . Macular degeneration   . Constipation   . Anxiety   . Arthritis   . Vaginal atrophy   . PSVT (paroxysmal supraventricular tachycardia) 03/05/2013  . Ankle fracture 05/22/2013  . Atrial fibrillation   . Vaginal odor 01/29/2014    Past Surgical History  Procedure Laterality Date  . Knee arthroscopy  2010    left-[facility]  . Trigger finger release      right  . Hemorrhoid surgery    . Cataract surgery      bilaterally  . Abdominal hysterectomy    . Colonoscopy  04/30/2011    Procedure: COLONOSCOPY;  Surgeon: Rogene Houston, MD;  [facility];  Service: Endoscopy;  Laterality: N/A;  8:30 am  . Esophagogastroduodenoscopy  05/27/2011    Procedure: ESOPHAGOGASTRODUODENOSCOPY (EGD);  Surgeon: Rogene Houston, MD;  [facility];  Service: Endoscopy;  Laterality: N/A;  300  . Cervical disc surgery    . Cholecystectomy  01/26/2012    Procedure: LAPAROSCOPIC CHOLECYSTECTOMY;  Surgeon: Jamesetta So, MD;  [facility]: [facility];   Service: General;  Laterality: N/A;  . Video bronchoscopy with endobronchial navigation N/A 06/11/2013    Procedure: VIDEO BRONCHOSCOPY WITH ENDOBRONCHIAL NAVIGATION;  Surgeon: Grace Isaac, MD;  Location: [facility];  Service: Thoracic;  Laterality: N/A;  . Endobronchial ultrasound N/A 06/11/2013    Procedure: ENDOBRONCHIAL ULTRASOUND;  Surgeon: Grace Isaac, MD;  Location: [facility];  Service: Thoracic;  Laterality: N/A;  . Bronchial biopsy N/A 06/11/2013    Procedure: TRANSBRONCHIAL BIOPSIES;  Surgeon: Grace Isaac, MD;  [facility]: [facility];  Service: Thoracic;  Laterality: N/A;    Family History  Problem Relation Age of Onset  . Colon cancer Neg Hx   . Heart disease Mother   . Heart disease Father   . Cancer Son     prostate  . Cancer Son     throat    Social History History  Substance Use Topics  . Smoking status: Never Smoker   . Smokeless tobacco: Never Used  . Alcohol Use: No    Allergies  Allergen Reactions  . Codeine Nausea And Vomiting    Current Outpatient Prescriptions  Medication Sig Dispense Refill  . ALPRAZolam (XANAX) 0.5 MG tablet Take 0.25-0.5 mg by mouth at bedtime. For sleep    . apixaban (ELIQUIS) 5 MG TABS tablet Take 1 tablet (5 mg total) by mouth 2 (two) times daily. [place]  tablet 3  . cholecalciferol (VITAMIN D) 1000 UNITS tablet Take 1,000 Units by mouth daily.    Marland Kitchen diltiazem (CARDIZEM CD) 240 MG 24 hr capsule Take 1 capsule (240 mg total) by mouth daily. 90 capsule 3  . docusate sodium (COLACE) 100 MG capsule Take 200 mg by mouth every evening.    . furosemide (LASIX) 20 MG tablet Take 1 tablet (20 mg total) by mouth daily. 90 tablet 3  . ketotifen (THERA TEARS ALLERGY) 0.025 % ophthalmic solution Place 1 drop into both eyes daily as needed (for irritation).     . metoprolol tartrate (LOPRESSOR) 50 MG tablet Take 1 tablet (50 mg total) by mouth 2 (two) times daily. 180 tablet 3  . pantoprazole (PROTONIX) 40 MG tablet Take 40 mg by mouth every  morning.     . pravastatin (PRAVACHOL) 40 MG tablet Take 40 mg by mouth at bedtime.      No current facility-administered medications for this visit.       Physical Exam Blood pressure 139/101, height 5\' 4"  (1.626 m), weight 149 lb (67.586 kg). Physical Exam She still ambulatory without assistive device  Right knee palpable lateral tenderness crepitance flexion ARC 125 collateral ligaments cruciate ligament stable motor exam normal skin intact good distal pulse normal sensation  Left knee mild crepitance no tenderness no swelling range of motion 125 ligament stable motor exam normal skin intact pulses normal  Data Reviewed A progressive arthritis see report 5 independently reviewed  Assessment    Vital knee pain right knee osteoarthritis    Plan    Procedure note right knee injection verbal consent was obtained to inject right knee joint  Timeout was completed to confirm the site of injection  The medications used were 40 mg of Depo-Medrol and 1% lidocaine 3 cc  Anesthesia was provided by ethyl chloride and the skin was prepped with alcohol.  After cleaning the skin with alcohol a 20-gauge needle was used to inject the right knee joint. There were no complications. A sterile bandage was applied.  Also recommend economy hinged knee brace  Procedure note left knee injection verbal consent was obtained to inject left knee joint  Timeout was completed to confirm the site of injection  The medications used were 40 mg of Depo-Medrol and 1% lidocaine 3 cc  Anesthesia was provided by ethyl chloride and the skin was prepped with alcohol.  After cleaning the skin with alcohol a 20-gauge needle was used to inject the left knee joint. There were no complications. A sterile bandage was applied.

## 2014-08-09 ENCOUNTER — Other Ambulatory Visit: Payer: Self-pay | Admitting: *Deleted

## 2014-08-09 ENCOUNTER — Other Ambulatory Visit: Payer: Self-pay | Admitting: Cardiology

## 2014-08-09 NOTE — Telephone Encounter (Signed)
[facility] faxed request for eliquis 5 mg tablets #[phone]

## 2014-08-09 NOTE — Telephone Encounter (Signed)
Refill done this am already [place] confirmed

## 2014-09-24 ENCOUNTER — Other Ambulatory Visit: Payer: Self-pay | Admitting: Cardiology

## 2014-10-04 ENCOUNTER — Emergency Department (HOSPITAL_COMMUNITY)
Admission: EM | Admit: 2014-10-04 | Discharge: 2014-10-04 | Disposition: A | Discharge status: Home / Self Care | Payer: PPO | Attending: Emergency Medicine | Admitting: Emergency Medicine

## 2014-10-04 ENCOUNTER — Encounter (HOSPITAL_COMMUNITY): Payer: Self-pay | Admitting: *Deleted

## 2014-10-04 ENCOUNTER — Emergency Department (HOSPITAL_COMMUNITY): Payer: PPO

## 2014-10-04 DIAGNOSIS — E785 Hyperlipidemia, unspecified: Secondary | ICD-10-CM | POA: Insufficient documentation

## 2014-10-04 DIAGNOSIS — F419 Anxiety disorder, unspecified: Secondary | ICD-10-CM | POA: Diagnosis not present

## 2014-10-04 DIAGNOSIS — Y998 Other external cause status: Secondary | ICD-10-CM | POA: Insufficient documentation

## 2014-10-04 DIAGNOSIS — K219 Gastro-esophageal reflux disease without esophagitis: Secondary | ICD-10-CM | POA: Diagnosis not present

## 2014-10-04 DIAGNOSIS — I471 Supraventricular tachycardia: Secondary | ICD-10-CM | POA: Diagnosis not present

## 2014-10-04 DIAGNOSIS — W01198A Fall on same level from slipping, tripping and stumbling with subsequent striking against other object, initial encounter: Secondary | ICD-10-CM | POA: Diagnosis not present

## 2014-10-04 DIAGNOSIS — S0990XA Unspecified injury of head, initial encounter: Secondary | ICD-10-CM | POA: Diagnosis not present

## 2014-10-04 DIAGNOSIS — Y9389 Activity, other specified: Secondary | ICD-10-CM | POA: Insufficient documentation

## 2014-10-04 DIAGNOSIS — M1990 Unspecified osteoarthritis, unspecified site: Secondary | ICD-10-CM | POA: Diagnosis not present

## 2014-10-04 DIAGNOSIS — Y9289 Other specified places as the place of occurrence of the external cause: Secondary | ICD-10-CM | POA: Diagnosis not present

## 2014-10-04 DIAGNOSIS — Z8742 Personal history of other diseases of the female genital tract: Secondary | ICD-10-CM | POA: Insufficient documentation

## 2014-10-04 DIAGNOSIS — K5900 Constipation, unspecified: Secondary | ICD-10-CM | POA: Diagnosis not present

## 2014-10-04 DIAGNOSIS — Z79899 Other long term (current) drug therapy: Secondary | ICD-10-CM | POA: Diagnosis not present

## 2014-10-04 DIAGNOSIS — W19XXXA Unspecified fall, initial encounter: Secondary | ICD-10-CM

## 2014-10-04 NOTE — ED Provider Notes (Signed)
CSN: [id number]     Arrival date & time 10/04/14  2028 History  This chart was scribed for Marcia Manifold, MD by Hilda Lias, ED Scribe. This patient was seen in room [facility] and the patient's care was started at 9:42 PM.    Chief Complaint  Patient presents with  . Fall     The history is provided by the patient. No language interpreter was used.     HPI Comments: Marcia Woods is a 81 y.o. female who presents to the Emergency Department complaining of a fall that occurred a few hours ago while pt was changing clothes. Pt states she fell and landed on the right side of her body, and notes hitting the right side of her head. Pt denies LOC and current headache, but states that the right side of her head has a numbing sensation. Pt denies neck pain, back pain, changes in vision, nausea.    Past Medical History  Diagnosis Date  . Hyperlipemia   . GERD (gastroesophageal reflux disease)   . Macular degeneration   . Constipation   . Anxiety   . Arthritis   . Vaginal atrophy   . PSVT (paroxysmal supraventricular tachycardia) 03/05/2013  . Ankle fracture 05/22/2013  . Atrial fibrillation   . Vaginal odor 01/29/2014   Past Surgical History  Procedure Laterality Date  . Knee arthroscopy  2010    left-[facility]  . Trigger finger release      right  . Hemorrhoid surgery    . Cataract surgery      bilaterally  . Abdominal hysterectomy    . Colonoscopy  04/30/2011    Procedure: COLONOSCOPY;  Surgeon: Rogene Houston, MD;  [facility];  Service: Endoscopy;  Laterality: N/A;  8:30 am  . Esophagogastroduodenoscopy  05/27/2011    Procedure: ESOPHAGOGASTRODUODENOSCOPY (EGD);  Surgeon: Rogene Houston, MD;  [facility];  Service: Endoscopy;  Laterality: N/A;  300  . Cervical disc surgery    . Cholecystectomy  01/26/2012    Procedure: LAPAROSCOPIC CHOLECYSTECTOMY;  Surgeon: Jamesetta So, MD;  [facility];  Service: General;  Laterality: N/A;  . Video  bronchoscopy with endobronchial navigation N/A 06/11/2013    Procedure: VIDEO BRONCHOSCOPY WITH ENDOBRONCHIAL NAVIGATION;  Surgeon: Grace Isaac, MD;  Location: [facility];  Service: Thoracic;  Laterality: N/A;  . Endobronchial ultrasound N/A 06/11/2013    Procedure: ENDOBRONCHIAL ULTRASOUND;  Surgeon: Grace Isaac, MD;  Location: [facility];  Service: Thoracic;  Laterality: N/A;  . Bronchial biopsy N/A 06/11/2013    Procedure: TRANSBRONCHIAL BIOPSIES;  Surgeon: Grace Isaac, MD;  Location: [facility] OR;  Service: Thoracic;  Laterality: N/A;   Family History  Problem Relation Age of Onset  . Colon cancer Neg Hx   . Heart disease Mother   . Heart disease Father   . Cancer Son     prostate  . Cancer Son     throat   History  Substance Use Topics  . Smoking status: Never Smoker   . Smokeless tobacco: Never Used  . Alcohol Use: No   OB History    Gravida Para Term Preterm AB TAB SAB Ectopic Multiple Living   2 2             Review of Systems  Eyes: Negative for visual disturbance.  Gastrointestinal: Negative for nausea.  Musculoskeletal: Negative for back pain and neck pain.  Neurological: Positive for numbness.  All other systems reviewed and are negative.  Allergies  Codeine  Home Medications   Prior to Admission medications   Medication Sig Start Date End Date Taking? Authorizing Provider  ALPRAZolam Duanne Moron) 0.5 MG tablet Take 0.25-0.5 mg by mouth at bedtime. For sleep    Historical Provider, MD  cholecalciferol (VITAMIN D) 1000 UNITS tablet Take 1,000 Units by mouth daily.    Historical Provider, MD  diltiazem (CARDIZEM CD) 240 MG 24 hr capsule TAKE (1) CAPSULE BY MOUTH ONCE DAILY. 09/24/14   Satira Sark, MD  docusate sodium (COLACE) 100 MG capsule Take 200 mg by mouth every evening.    Historical Provider, MD  ELIQUIS 5 MG TABS tablet TAKE 1 TABLET BY MOUTH TWICE DAILY. 08/09/14   Satira Sark, MD  furosemide (LASIX) 20 MG tablet Take 1 tablet (20 mg total)  by mouth daily. 12/24/13   Satira Sark, MD  ketotifen (THERA TEARS ALLERGY) 0.025 % ophthalmic solution Place 1 drop into both eyes daily as needed (for irritation).     Historical Provider, MD  metoprolol (LOPRESSOR) 50 MG tablet TAKE (1) TABLET BY MOUTH TWICE DAILY. 09/24/14   Satira Sark, MD  pantoprazole (PROTONIX) 40 MG tablet Take 40 mg by mouth every morning.     Historical Provider, MD  pravastatin (PRAVACHOL) 40 MG tablet Take 40 mg by mouth at bedtime.     Historical Provider, MD   BP 161/102 mmHg  Pulse 80  Temp(Src) 97.9 F (36.6 C) (Oral)  Resp 16  Ht 5\' 3"  (1.6 m)  Wt 145 lb (65.772 kg)  BMI 25.69 kg/m2  SpO2 100% Physical Exam  Constitutional: She is oriented to person, place, and time. She appears well-developed and well-nourished.  HENT:  Head: Normocephalic and atraumatic.  Cardiovascular: Normal rate.   Pulmonary/Chest: Effort normal.  Abdominal: She exhibits no distension.  Musculoskeletal:  No signs of trauma No scalp tenderness No midline spinal tenderness   Neurological: She is alert and oriented to person, place, and time. She displays normal reflexes. No cranial nerve deficit. She exhibits normal muscle tone. Coordination normal.  Skin: Skin is warm and dry.  Psychiatric: She has a normal mood and affect.  Nursing note and vitals reviewed.   ED Course  Procedures (including critical care time)  DIAGNOSTIC STUDIES: Oxygen Saturation is 100% on room air, normal by my interpretation.    COORDINATION OF CARE: 9:46 PM Discussed treatment plan with pt at bedside and pt agreed to plan.   Labs Review Labs Reviewed - No data to display  Imaging Review No results found.   Ct Head Wo Contrast  10/04/2014   CLINICAL DATA:  [place] tonight while changing close at home. I ended on the right side of the head. Headache.  EXAM: CT HEAD WITHOUT CONTRAST  TECHNIQUE: Contiguous axial images were obtained from the base of the skull through the vertex without  intravenous contrast.  COMPARISON:  07/30/2008  FINDINGS: The brain shows generalized age related atrophy. There chronic small-vessel ischemic changes within the deep white matter. No sign of acute infarction, mass lesion, hemorrhage, hydrocephalus or extra-axial collection. No skull fracture. No fluid in the sinuses, middle ears or mastoids.  IMPRESSION: No acute or traumatic finding. Atrophy and chronic small vessel disease.   Electronically Signed   By: Nelson Chimes M.D.   On: 10/04/2014 22:19    EKG Interpretation None      MDM   Final diagnoses:  Fall, initial encounter  Minor head injury, initial encounter    81 year old female  with mechanical fall or she struck her head. Patient is also request. There is no loss of consciousness. No acute neurological complaints. Nonfocal neurological examination. CT of the head does not show any acute abnormality. Feel she is stable for discharge at this time. Discussed possibility of delayed bleed.It has been determined that no acute conditions requiring further emergency intervention are present at this time. The patient has been advised of the diagnosis and plan. I reviewed any labs and imaging including any potential incidental findings. We have discussed signs and symptoms that warrant return to the ED and they are listed in the discharge instructions.    I personally preformed the services scribed in my presence. The recorded information has been reviewed is accurate. Marcia Manifold, MD.    Marcia Manifold, MD 10/08/14 [phone]

## 2014-10-04 NOTE — ED Notes (Addendum)
Pt states she was putting on her pajamas and tripped while putting them on and fell and hit the right side of her head. Pt states her head just doesn't feel right. Pt is on a blood thinner

## 2014-10-04 NOTE — ED Notes (Signed)
Patient ambulated with steady gait to bathroom.

## 2014-10-04 NOTE — Discharge Instructions (Signed)

## 2014-10-04 NOTE — ED Notes (Signed)
Discharge instructions given and reviewed with patient.  Patient verbalized understanding to follow up with PDM as needed.  Patient ambulatory; discharged home in good condition.

## 2014-10-29 LAB — CBC
HCT: 43.7 % (ref 36.0–46.0)
HEMOGLOBIN: 14.3 g/dL (ref 12.0–15.0)
MCH: 30.2 pg (ref 26.0–34.0)
MCHC: 32.7 g/dL (ref 30.0–36.0)
MCV: 92.2 fL (ref 78.0–100.0)
MPV: 9.7 fL (ref 8.6–12.4)
PLATELETS: 213 10*3/uL (ref 150–400)
RBC: 4.74 MIL/uL (ref 3.87–5.11)
RDW: 14.1 % (ref 11.5–15.5)
WBC: 4 10*3/uL (ref 4.0–10.5)

## 2014-10-29 LAB — BASIC METABOLIC PANEL
BUN: 11 mg/dL (ref 6–23)
CO2: 28 mEq/L (ref 19–32)
Calcium: 9.4 mg/dL (ref 8.4–10.5)
Chloride: 104 mEq/L (ref 96–112)
Creat: 0.78 mg/dL (ref 0.50–1.10)
Glucose, Bld: 87 mg/dL (ref 70–99)
POTASSIUM: 5 meq/L (ref 3.5–5.3)
SODIUM: 138 meq/L (ref 135–145)

## 2014-11-01 ENCOUNTER — Ambulatory Visit (INDEPENDENT_AMBULATORY_CARE_PROVIDER_SITE_OTHER): Payer: PPO | Admitting: Cardiology

## 2014-11-01 ENCOUNTER — Encounter: Payer: Self-pay | Admitting: Cardiology

## 2014-11-01 VITALS — BP 108/60 | HR 52 | Ht 63.0 in | Wt 145.0 lb

## 2014-11-01 DIAGNOSIS — I471 Supraventricular tachycardia: Secondary | ICD-10-CM | POA: Diagnosis not present

## 2014-11-01 DIAGNOSIS — I480 Paroxysmal atrial fibrillation: Secondary | ICD-10-CM | POA: Diagnosis not present

## 2014-11-01 NOTE — Progress Notes (Signed)
Cardiology Office Note  Date: 11/01/2014   ID: ALEXSYS ESKIN, DOB 1926/06/09, MRN [id number]  PCP: [name], MD  Primary Cardiologist: Rozann Lesches, MD   Chief Complaint  Patient presents with  . Atrial Fibrillation    History of Present Illness: Marcia Woods is an 81 y.o. female last seen in February. She reports no major change in cardiac symptoms, chronic stable shortness of breath and no major sense of palpitations. She is limited by knee pain, has been having injections per Dr. Aline Brochure, does not plan on pursuing surgery.  Medications are reviewed below, she continues on Cardizem CD and Lopressor.  She reports no bleeding problems on Eliquis, recent lab work is reviewed below as well.   Past Medical History  Diagnosis Date  . Hyperlipemia   . GERD (gastroesophageal reflux disease)   . Macular degeneration   . Constipation   . Anxiety   . Arthritis   . Vaginal atrophy   . PSVT (paroxysmal supraventricular tachycardia) 03/05/2013  . Ankle fracture 05/22/2013  . Atrial fibrillation   . Vaginal odor 01/29/2014    Past Surgical History  Procedure Laterality Date  . Knee arthroscopy  2010    left-[facility]  . Trigger finger release      right  . Hemorrhoid surgery    . Cataract surgery      bilaterally  . Abdominal hysterectomy    . Colonoscopy  04/30/2011    Procedure: COLONOSCOPY;  Surgeon: Rogene Houston, MD;  Location: [facility];  Service: Endoscopy;  Laterality: N/A;  8:30 am  . Esophagogastroduodenoscopy  05/27/2011    Procedure: ESOPHAGOGASTRODUODENOSCOPY (EGD);  Surgeon: Rogene Houston, MD;  Location: [facility];  Service: Endoscopy;  Laterality: N/A;  300  . Cervical disc surgery    . Cholecystectomy  01/26/2012    Procedure: LAPAROSCOPIC CHOLECYSTECTOMY;  Surgeon: Jamesetta So, MD;  Location: [facility];  Service: General;  Laterality: N/A;  . Video bronchoscopy with endobronchial navigation N/A 06/11/2013    Procedure: VIDEO BRONCHOSCOPY  WITH ENDOBRONCHIAL NAVIGATION;  Surgeon: Grace Isaac, MD;  Location: [place];  Service: Thoracic;  Laterality: N/A;  . Endobronchial ultrasound N/A 06/11/2013    Procedure: ENDOBRONCHIAL ULTRASOUND;  Surgeon: Grace Isaac, MD;  Location: [place];  Service: Thoracic;  Laterality: N/A;  . Bronchial biopsy N/A 06/11/2013    Procedure: TRANSBRONCHIAL BIOPSIES;  Surgeon: Grace Isaac, MD;  Location: [place];  Service: Thoracic;  Laterality: N/A;    Current Outpatient Prescriptions  Medication Sig Dispense Refill  . ALPRAZolam (XANAX) 0.5 MG tablet Take 0.25-0.5 mg by mouth at bedtime. For sleep    . cholecalciferol (VITAMIN D) 1000 UNITS tablet Take 2,000 Units by mouth daily.     Marland Kitchen diltiazem (CARDIZEM CD) 240 MG 24 hr capsule TAKE (1) CAPSULE BY MOUTH ONCE DAILY. 90 capsule 1  . ELIQUIS 5 MG TABS tablet TAKE 1 TABLET BY MOUTH TWICE DAILY. 60 tablet 6  . furosemide (LASIX) 20 MG tablet Take 1 tablet (20 mg total) by mouth daily. (Patient taking differently: Take 20 mg by mouth daily as needed for fluid or edema. ) 90 tablet 3  . ketotifen (THERA TEARS ALLERGY) 0.025 % ophthalmic solution Place 1 drop into both eyes daily as needed (for irritation).     . metoprolol (LOPRESSOR) 50 MG tablet TAKE (1) TABLET BY MOUTH TWICE DAILY. 180 tablet 1  . Multiple Vitamins-Minerals (PRESERVISION AREDS 2 PO) Take 1 capsule by mouth 2 (two)  times daily.    . pantoprazole (PROTONIX) 40 MG tablet Take 40 mg by mouth every morning.     . pravastatin (PRAVACHOL) 40 MG tablet Take 40 mg by mouth at bedtime.      No current facility-administered medications for this visit.    Allergies:  Codeine   Social History: The patient  reports that she has never smoked. She has never used smokeless tobacco. She reports that she does not drink alcohol or use illicit drugs.   ROS:  Please see the history of present illness. Otherwise, complete review of systems is positive for erythematous skin changes on the lower  legs.  All other systems are reviewed and negative.   Physical Exam: VS:  BP 108/60 mmHg  Pulse 52  Ht 5\' 3"  (1.6 m)  Wt 145 lb (65.772 kg)  BMI 25.69 kg/m2  SpO2 95%, BMI Body mass index is 25.69 kg/(m^2).  Wt Readings from Last 3 Encounters:  11/01/14 145 lb (65.772 kg)  10/04/14 145 lb (65.772 kg)  08/01/14 149 lb (67.586 kg)     General: Patient appears comfortable at rest. HEENT: Conjunctiva and lids normal, oropharynx clear with moist mucosa. Neck: Supple, no elevated JVP or carotid bruits, no thyromegaly. Lungs: Clear to auscultation, nonlabored breathing at rest. Cardiac: Irregularly irregular, no S3 or significant systolic murmur, no pericardial rub. Abdomen: Soft, nontender, no hepatomegaly, bowel sounds present, no guarding or rebound. Extremities: No pitting edema, distal pulses 2+. Skin: Warm and dry. Light, red maculopapular skin changes on the legs. No associated swelling with good distal pulses. Musculoskeletal: No kyphosis. Neuropsychiatric: Alert and oriented x3, affect grossly appropriate.   ECG: ECG is not ordered today.  Recent Labwork: 10/28/2014: BUN 11; Creatinine 0.78; Hemoglobin 14.3; Platelets 213; Potassium 5.0; Sodium 138    ASSESSMENT AND PLAN:  1. Paroxysmal atrial fibrillation, continue strategy of anticoagulation with Eliquis and heart rate control on Cardizem CD and Lopressor. Follow-up arranged. If she gets lab work with her next visit with Dr. Willey Blade later in the year, we can review this as well.  2. History of PSVT.  Current medicines were reviewed at length with the patient today.   Disposition: FU with me in 6 months.   Signed, Satira Sark, MD, [facility] 11/01/2014 11:25 AM    [place] [facility] [address] [place], [place] [address] Phone: [phone]; Fax: [phone]

## 2014-11-01 NOTE — Patient Instructions (Signed)
Your physician wants you to follow-up in: 6 months with Dr. McDowell You will receive a reminder letter in the mail two months in advance. If you don't receive a letter, please call our office to schedule the follow-up appointment.  Your physician recommends that you continue on your current medications as directed. Please refer to the Current Medication list given to you today.  Thank you for choosing [facility]!!    

## 2014-11-22 ENCOUNTER — Encounter: Payer: Self-pay | Admitting: Radiation Oncology

## 2014-11-22 NOTE — Progress Notes (Signed)
CT Chest w/o CM approved through [place], KICH#[id number] 6/14-12/04/2015.

## 2014-11-27 ENCOUNTER — Emergency Department (HOSPITAL_COMMUNITY): Payer: PPO

## 2014-11-27 ENCOUNTER — Encounter (HOSPITAL_COMMUNITY): Payer: Self-pay | Admitting: Emergency Medicine

## 2014-11-27 ENCOUNTER — Telehealth: Payer: Self-pay | Admitting: Orthopedic Surgery

## 2014-11-27 ENCOUNTER — Emergency Department (HOSPITAL_COMMUNITY)
Admission: EM | Admit: 2014-11-27 | Discharge: 2014-11-27 | Disposition: A | Discharge status: Home / Self Care | Payer: PPO | Attending: Emergency Medicine | Admitting: Emergency Medicine

## 2014-11-27 DIAGNOSIS — Y93H2 Activity, gardening and landscaping: Secondary | ICD-10-CM | POA: Insufficient documentation

## 2014-11-27 DIAGNOSIS — Y92137 Garden or yard on military base as the place of occurrence of the external cause: Secondary | ICD-10-CM | POA: Diagnosis not present

## 2014-11-27 DIAGNOSIS — S52512A Displaced fracture of left radial styloid process, initial encounter for closed fracture: Secondary | ICD-10-CM | POA: Insufficient documentation

## 2014-11-27 DIAGNOSIS — S52502A Unspecified fracture of the lower end of left radius, initial encounter for closed fracture: Secondary | ICD-10-CM

## 2014-11-27 DIAGNOSIS — K219 Gastro-esophageal reflux disease without esophagitis: Secondary | ICD-10-CM | POA: Insufficient documentation

## 2014-11-27 DIAGNOSIS — Y990 Civilian activity done for income or pay: Secondary | ICD-10-CM | POA: Insufficient documentation

## 2014-11-27 DIAGNOSIS — I4891 Unspecified atrial fibrillation: Secondary | ICD-10-CM | POA: Insufficient documentation

## 2014-11-27 DIAGNOSIS — W19XXXA Unspecified fall, initial encounter: Secondary | ICD-10-CM

## 2014-11-27 DIAGNOSIS — I471 Supraventricular tachycardia: Secondary | ICD-10-CM | POA: Diagnosis not present

## 2014-11-27 DIAGNOSIS — E785 Hyperlipidemia, unspecified: Secondary | ICD-10-CM | POA: Insufficient documentation

## 2014-11-27 DIAGNOSIS — F419 Anxiety disorder, unspecified: Secondary | ICD-10-CM | POA: Diagnosis not present

## 2014-11-27 DIAGNOSIS — Z872 Personal history of diseases of the skin and subcutaneous tissue: Secondary | ICD-10-CM | POA: Insufficient documentation

## 2014-11-27 DIAGNOSIS — W01198A Fall on same level from slipping, tripping and stumbling with subsequent striking against other object, initial encounter: Secondary | ICD-10-CM | POA: Insufficient documentation

## 2014-11-27 DIAGNOSIS — Z79899 Other long term (current) drug therapy: Secondary | ICD-10-CM | POA: Diagnosis not present

## 2014-11-27 DIAGNOSIS — M1990 Unspecified osteoarthritis, unspecified site: Secondary | ICD-10-CM | POA: Insufficient documentation

## 2014-11-27 DIAGNOSIS — K5900 Constipation, unspecified: Secondary | ICD-10-CM | POA: Insufficient documentation

## 2014-11-27 DIAGNOSIS — S59912A Unspecified injury of left forearm, initial encounter: Secondary | ICD-10-CM | POA: Diagnosis present

## 2014-11-27 MED ORDER — TRAMADOL HCL 50 MG PO TABS: 25.0000 mg | ORAL_TABLET | Freq: Four times a day (QID) | ORAL | Status: DC | PRN

## 2014-11-27 MED ORDER — TRAMADOL HCL 50 MG PO TABS
50.0000 mg | ORAL_TABLET | Freq: Once | ORAL | Status: AC
  Administered 2014-11-27: 50 mg via ORAL
  Filled 2014-11-27: qty 1

## 2014-11-27 NOTE — Telephone Encounter (Signed)
I went ahead and scheduled her for 9:00 am in the morning since we did have a cancellation in that spot thanks

## 2014-11-27 NOTE — ED Provider Notes (Signed)
CSN: [id number]     Arrival date & time 11/27/14  0932 History  This chart was scribed for Marcia Greek, MD by Meriel Pica, ED Scribe. This patient was seen in room [facility] and the patient's care was started 9:06 AM.   Chief Complaint  Patient presents with  . Arm Pain   The history is provided by the patient. No language interpreter was used.    HPI Comments: Marcia Woods is a 81 y.o. female, with a PMhx of ankle fracture, A-fibb, PSVT, arthritis, and recent falls,  who presents to the Emergency Department complaining of sudden onset, constant, left wrist pain and edema s/p fall that occurred pta. Pt is on blood thinning medication and is regularly followed by Rozann Lesches, MD her cardiologist. She is ambulatory. Pt denies LOC, head injury, neck pain, or back pain.  Past Medical History  Diagnosis Date  . Hyperlipemia   . GERD (gastroesophageal reflux disease)   . Macular degeneration   . Constipation   . Anxiety   . Arthritis   . Vaginal atrophy   . PSVT (paroxysmal supraventricular tachycardia) 03/05/2013  . Ankle fracture 05/22/2013  . Atrial fibrillation   . Vaginal odor 01/29/2014   Past Surgical History  Procedure Laterality Date  . Knee arthroscopy  2010    left-[facility]  . Trigger finger release      right  . Hemorrhoid surgery    . Cataract surgery      bilaterally  . Abdominal hysterectomy    . Colonoscopy  04/30/2011    Procedure: COLONOSCOPY;  Surgeon: Rogene Houston, MD;  [facility];  Service: Endoscopy;  Laterality: N/A;  8:30 am  . Esophagogastroduodenoscopy  05/27/2011    Procedure: ESOPHAGOGASTRODUODENOSCOPY (EGD);  Surgeon: Rogene Houston, MD;  [facility];  Service: Endoscopy;  Laterality: N/A;  300  . Cervical disc surgery    . Cholecystectomy  01/26/2012    Procedure: LAPAROSCOPIC CHOLECYSTECTOMY;  Surgeon: Jamesetta So, MD;  Location: [facility];  Service: General;  Laterality: N/A;  . Video bronchoscopy with  endobronchial navigation N/A 06/11/2013    Procedure: VIDEO BRONCHOSCOPY WITH ENDOBRONCHIAL NAVIGATION;  Surgeon: Grace Isaac, MD;  Location: [place];  Service: Thoracic;  Laterality: N/A;  . Endobronchial ultrasound N/A 06/11/2013    Procedure: ENDOBRONCHIAL ULTRASOUND;  Surgeon: Grace Isaac, MD;  Location: [place];  Service: Thoracic;  Laterality: N/A;  . Bronchial biopsy N/A 06/11/2013    Procedure: TRANSBRONCHIAL BIOPSIES;  Surgeon: Grace Isaac, MD;  [facility];  Service: Thoracic;  Laterality: N/A;   Family History  Problem Relation Age of Onset  . Colon cancer Neg Hx   . Heart disease Mother   . Heart disease Father   . Cancer Son     prostate  . Cancer Son     throat   History  Substance Use Topics  . Smoking status: Never Smoker   . Smokeless tobacco: Never Used  . Alcohol Use: No   OB History    Gravida Para Term Preterm AB TAB SAB Ectopic Multiple Living   2 2             Review of Systems  Constitutional: Negative for fever.  Gastrointestinal: Negative for abdominal pain.  Musculoskeletal: Positive for myalgias and arthralgias ( left wrist ). Negative for back pain and neck pain.  Neurological: Negative for syncope.  All other systems reviewed and are negative.  Allergies  Codeine  Home Medications   Prior to Admission medications   Medication Sig Start Date End Date Taking? Authorizing Provider  ALPRAZolam Duanne Moron) 0.5 MG tablet Take 0.25-0.5 mg by mouth at bedtime. For sleep   Yes Historical Provider, MD  cholecalciferol (VITAMIN D) 1000 UNITS tablet Take 2,000 Units by mouth daily.    Yes Historical Provider, MD  diltiazem (CARDIZEM CD) 240 MG 24 hr capsule TAKE (1) CAPSULE BY MOUTH ONCE DAILY. 09/24/14  Yes Satira Sark, MD  docusate sodium (COLACE) 100 MG capsule Take 100 mg by mouth daily as needed for mild constipation.   Yes Historical Provider, MD  ELIQUIS 5 MG TABS tablet TAKE 1 TABLET BY MOUTH TWICE DAILY. 08/09/14  Yes Satira Sark, MD  furosemide (LASIX) 20 MG tablet Take 1 tablet (20 mg total) by mouth daily. Patient taking differently: Take 20 mg by mouth daily as needed for fluid or edema.  12/24/13  Yes Satira Sark, MD  ketotifen (THERA TEARS ALLERGY) 0.025 % ophthalmic solution Place 1 drop into both eyes daily as needed (for irritation).    Yes Historical Provider, MD  metoprolol (LOPRESSOR) 50 MG tablet TAKE (1) TABLET BY MOUTH TWICE DAILY. 09/24/14  Yes Satira Sark, MD  Multiple Vitamins-Minerals (PRESERVISION AREDS 2 PO) Take 1 capsule by mouth 2 (two) times daily.   Yes Historical Provider, MD  pantoprazole (PROTONIX) 40 MG tablet Take 40 mg by mouth every morning.    Yes Historical Provider, MD  pravastatin (PRAVACHOL) 40 MG tablet Take 40 mg by mouth at bedtime.    Yes Historical Provider, MD  traMADol (ULTRAM) 50 MG tablet Take 0.5-1 tablets (25-50 mg total) by mouth every 6 (six) hours as needed. 11/27/14   Marcia Greek, MD   BP 151/109 mmHg  Pulse 93  Temp(Src) 97.5 F (36.4 C) (Oral)  Resp 18  Ht 5\' 2"  (1.575 m)  Wt 143 lb (64.864 kg)  BMI 26.15 kg/m2  SpO2 98%   Physical Exam  Constitutional: She is oriented to person, place, and time. She appears well-developed and well-nourished. No distress.  HENT:  Head: Normocephalic and atraumatic.  Right Ear: Hearing normal.  Left Ear: Hearing normal.  Nose: Nose normal.  Mouth/Throat: Oropharynx is clear and moist and mucous membranes are normal.  Eyes: Conjunctivae and EOM are normal. Pupils are equal, round, and reactive to light.  Neck: Normal range of motion. Neck supple.  Cardiovascular: S1 normal and S2 normal.  Exam reveals no gallop and no friction rub.   No murmur heard. Irregularly irregular rhythm.   Pulmonary/Chest: Effort normal and breath sounds normal. No respiratory distress. She exhibits no tenderness.  Abdominal: Soft. Normal appearance and bowel sounds are normal. There is no hepatosplenomegaly. There is no  tenderness. There is no rebound, no guarding, no tenderness at McBurney's point and negative Murphy's sign. No hernia.  Musculoskeletal: Normal range of motion.  Tenderness, edema, and decreased ROM in left wrist.    Neurological: She is alert and oriented to person, place, and time. She has normal strength. No cranial nerve deficit or sensory deficit. Coordination normal. GCS eye subscore is 4. GCS verbal subscore is 5. GCS motor subscore is 6.  Skin: Skin is warm, dry and intact. No rash noted. No cyanosis.  Psychiatric: She has a normal mood and affect. Her speech is normal and behavior is normal. Thought content normal.  Nursing note and vitals reviewed.  ED Course  Procedures  DIAGNOSTIC STUDIES: Oxygen Saturation is 98%  on RA, normal by my interpretation.    COORDINATION OF CARE: 9:09 AM Discussed treatment plan which includes to get diagnostic imaging with pt. Pt acknowledges and agrees to plan.   Labs Review Labs Reviewed - No data to display  Imaging Review Dg Wrist Complete Left  11/27/2014   CLINICAL DATA:  Initial encounter. Fall this morning. Wrist pain. Wrist fracture.  EXAM: LEFT WRIST - COMPLETE 3+ VIEW  COMPARISON:  None.  FINDINGS: Comminuted distal radius fracture is present. The fracture is intra-articular and there is shortening of the distal radius associated with impaction. Loss of the normal volar tilt with apex volar angulation. Fracture fragments are mildly displaced, most notably at the distal radioulnar joint. Ulnar styloid avulsion is also present. Osteopenia. Carpal alignment appears within normal limits allowing for projection. STT joint osteoarthritis.  IMPRESSION: 1. Comminuted intra-articular distal radius fracture with apex volar angulation. 2. Shortening of the distal radius associated with impaction. 3. Ulnar styloid avulsion.   Electronically Signed   By: Dereck Ligas M.D.   On: 11/27/2014 09:35   Ct Head Wo Contrast  11/27/2014   CLINICAL DATA:  Fall  today, hit back of head.  EXAM: CT HEAD WITHOUT CONTRAST  TECHNIQUE: Contiguous axial images were obtained from the base of the skull through the vertex without intravenous contrast.  COMPARISON:  10/04/2014  FINDINGS: Mild age related volume loss and chronic microvascular disease throughout the deep white matter. No acute intracranial abnormality. Specifically, no hemorrhage, hydrocephalus, mass lesion, acute infarction, or significant intracranial injury. No acute calvarial abnormality. Visualized paranasal sinuses and mastoids clear. Orbital soft tissues unremarkable.  IMPRESSION: No acute intracranial abnormality.  Atrophy, chronic microvascular disease.   Electronically Signed   By: Rolm Baptise M.D.   On: 11/27/2014 09:51     EKG Interpretation None      MDM   Final diagnoses:  Fall  Distal radius fracture, left, closed, initial encounter    Presents to the ER for evaluation of left wrist pain after a fall. Patient fell onto her outstretched arm, injuring the wrist. She had a large amount of swelling and decreased range of motion noted. No overlying wounds. X-ray confirms comminuted intra-articular distal radius fracture. Patient did hit her head and is on a blood thinner, CT scan was therefore performed. CT scan of head was negative. No neck or back pain. No evidence of hip or lower extremity injury.  Case discussed with Dr. Aline Brochure. He has reviewed the images, agrees that the patient can be splinted and followed up in the office.  I personally performed the services described in this documentation, which was scribed in my presence. The recorded information has been reviewed and is accurate.     Marcia Greek, MD 11/27/14 1053

## 2014-11-27 NOTE — Telephone Encounter (Addendum)
Dr. Aline Brochure I had a call from a Marcia Woods daughter to Marcia Woods whom was in the ER at [facility] earlier today regarding an LFT WRIST FX asking to schedule an appointment, I looked back in the ER note and saw where the ER Dr. An yourself discussed this issue, do I just need to bring her in the office for an ER F/U visit, please advise?

## 2014-11-27 NOTE — Discharge Instructions (Signed)
Wrist Fracture A wrist fracture is a break or crack in one of the bones of your wrist. Your wrist is made up of eight small bones at the palm of your hand (carpal bones) and two long bones that make up your forearm (radius and ulna).  CAUSES   A direct blow to the wrist.  Falling on an outstretched hand.  Trauma, such as a car accident or a fall. RISK FACTORS Risk factors for wrist fracture include:   Participating in contact and high-risk sports, such as skiing, biking, and ice skating.  Taking steroid medicines.  Smoking.  Being female.  Being Caucasian.  Drinking more than three alcoholic beverages per day.  Having low or lowered bone density (osteoporosis or osteopenia).  Age. Older adults have decreased bone density.  Women who have had menopause.  History of previous fractures. SIGNS AND SYMPTOMS Symptoms of wrist fractures include tenderness, bruising, and inflammation. Additionally, the wrist may hang in an odd position or appear deformed.  DIAGNOSIS Diagnosis may include:  Physical exam.  X-ray. TREATMENT Treatment depends on many factors, including the nature and location of the fracture, your age, and your activity level. Treatment for wrist fracture can be nonsurgical or surgical.  Nonsurgical Treatment A plaster cast or splint may be applied to your wrist if the bone is in a good position. If the fracture is not in good position, it may be necessary for your health care provider to realign it before applying a splint or cast. Usually, a cast or splint will be worn for several weeks.  Surgical Treatment Sometimes the position of the bone is so far out of place that surgery is required to apply a device to hold it together as it heals. Depending on the fracture, there are a number of options for holding the bone in place while it heals, such as a cast and metal pins.  HOME CARE INSTRUCTIONS  Keep your injured wrist elevated and move your fingers as much as  possible.  Do not put pressure on any part of your cast or splint. It may break.   Use a plastic bag to protect your cast or splint from water while bathing or showering. Do not lower your cast or splint into water.  Take medicines only as directed by your health care provider.  Keep your cast or splint clean and dry. If it becomes wet, damaged, or suddenly feels too tight, contact your health care provider right away.  Do not use any tobacco products including cigarettes, chewing tobacco, or electronic cigarettes. Tobacco can delay bone healing. If you need help quitting, ask your health care provider.  Keep all follow-up visits as directed by your health care provider. This is important.  Ask your health care provider if you should take supplements of calcium and vitamins C and D to promote bone healing. SEEK MEDICAL CARE IF:   Your cast or splint is damaged, breaks, or gets wet.  You have a fever.  You have chills.  You have continued severe pain or more swelling than you did before the cast was put on. SEEK IMMEDIATE MEDICAL CARE IF:   Your hand or fingernails on the injured arm turn blue or gray, or feel cold or numb.  You have decreased feeling in the fingers of your injured arm. MAKE SURE YOU:  Understand these instructions.  Will watch your condition.  Will get help right away if you are not doing well or get worse. Document Released: 03/17/2005 Document Revised:   10/22/2013 Document Reviewed: 06/25/2011 Exit[employer] Patient Information 2015 [employer]. This information is not intended to replace advice given to you by your health care provider. Make sure you discuss any questions you have with your health care provider.  

## 2014-11-27 NOTE — ED Notes (Signed)
Pt reports left arm pain since falling back while working in the garden. Pt denies loc,dizziness. nad noted. Mild deformity noted to left arm.

## 2014-11-28 ENCOUNTER — Encounter: Payer: Self-pay | Admitting: Orthopedic Surgery

## 2014-11-28 ENCOUNTER — Ambulatory Visit (INDEPENDENT_AMBULATORY_CARE_PROVIDER_SITE_OTHER): Payer: PPO | Admitting: Orthopedic Surgery

## 2014-11-28 VITALS — BP 140/84 | Ht 62.0 in | Wt 143.0 lb

## 2014-11-28 DIAGNOSIS — S52502A Unspecified fracture of the lower end of left radius, initial encounter for closed fracture: Secondary | ICD-10-CM

## 2014-11-28 MED ORDER — PROMETHAZINE HCL 12.5 MG PO TABS: 12.5000 mg | ORAL_TABLET | Freq: Four times a day (QID) | ORAL | Status: DC | PRN

## 2014-11-28 MED ORDER — HYDROCODONE-ACETAMINOPHEN 5-325 MG PO TABS: 1.0000 | ORAL_TABLET | Freq: Four times a day (QID) | ORAL | Status: DC | PRN

## 2014-11-28 NOTE — Patient Instructions (Signed)
Will speak with Dr Domenic Polite about possible surgery

## 2014-11-28 NOTE — Progress Notes (Signed)
Patient ID: Marcia Woods, female   DOB: 1925/10/27, 81 y.o.   MRN: [id number]  Chief Complaint  Patient presents with  . Wrist Injury    er follow up left wrist fracture, DOI 11/27/14     [facility] imaging multiple views left wrist dorsal displaced distal radius fracture closed independent interpretation agree.Marland Kitchendx   Diagnosis Encounter Diagnosis  Name Primary?  . Distal radius fracture, left, closed, initial encounter Yes      ManagementI offered the patient nonoperative treatment with the risk of loss of flexion extension and loss of rotational motion. She opted for surgical management with a risk of infection and hardware complication  She will have to come off of the Eliquis, we will talk to her cardiologist about that and then arrange the surgery open treatment internal fixation left wrist with DVR volar plate     DEREK HUNEYCUTT is a 81 y.o. female.   HPI 81 year old female fell in her garden no history of loss of consciousness dizziness. Just tripped and fell. She complains of left wrist pain aching, severe, constant, times one day. Associated symptoms nausea and vomiting. She is allergic to codeine but it's more of her reaction with nausea vomiting. She took tramadol pain relief not sufficient.  History of system review shows patient has no major problems at this point her heart rate and symptoms are under control she does have atrial fibrillation. She has no fever no chills. There was no skin breaks. Other review of systems normal   Review of Systems:  See hpi  Past Medical History  Diagnosis Date  . Hyperlipemia   . GERD (gastroesophageal reflux disease)   . Macular degeneration   . Constipation   . Anxiety   . Arthritis   . Vaginal atrophy   . PSVT (paroxysmal supraventricular tachycardia) 03/05/2013  . Ankle fracture 05/22/2013  . Atrial fibrillation   . Vaginal odor 01/29/2014    Past Surgical History  Procedure Laterality Date  . Knee  arthroscopy  2010    left-[facility]  . Trigger finger release      right  . Hemorrhoid surgery    . Cataract surgery      bilaterally  . Abdominal hysterectomy    . Colonoscopy  04/30/2011    Procedure: COLONOSCOPY;  Surgeon: Rogene Houston, MD;  [facility];  Service: Endoscopy;  Laterality: N/A;  8:30 am  . Esophagogastroduodenoscopy  05/27/2011    Procedure: ESOPHAGOGASTRODUODENOSCOPY (EGD);  Surgeon: Rogene Houston, MD;  [facility];  Service: Endoscopy;  Laterality: N/A;  300  . Cervical disc surgery    . Cholecystectomy  01/26/2012    Procedure: LAPAROSCOPIC CHOLECYSTECTOMY;  Surgeon: Jamesetta So, MD;  Location: [facility];  Service: General;  Laterality: N/A;  . Video bronchoscopy with endobronchial navigation N/A 06/11/2013    Procedure: VIDEO BRONCHOSCOPY WITH ENDOBRONCHIAL NAVIGATION;  Surgeon: Grace Isaac, MD;  Location: [place];  Service: Thoracic;  Laterality: N/A;  . Endobronchial ultrasound N/A 06/11/2013    Procedure: ENDOBRONCHIAL ULTRASOUND;  Surgeon: Grace Isaac, MD;  Location: [place];  Service: Thoracic;  Laterality: N/A;  . Bronchial biopsy N/A 06/11/2013    Procedure: TRANSBRONCHIAL BIOPSIES;  Surgeon: Grace Isaac, MD;  Location: [facility] OR;  Service: Thoracic;  Laterality: N/A;    Family History  Problem Relation Age of Onset  . Colon cancer Neg Hx   . Heart disease Mother   . Heart disease Father   .  Cancer Son     prostate  . Cancer Son     throat    Social History History  Substance Use Topics  . Smoking status: Never Smoker   . Smokeless tobacco: Never Used  . Alcohol Use: No    Allergies  Allergen Reactions  . Codeine Nausea And Vomiting    Current Outpatient Prescriptions  Medication Sig Dispense Refill  . diltiazem (CARDIZEM CD) 240 MG 24 hr capsule TAKE (1) CAPSULE BY MOUTH ONCE DAILY. 90 capsule 1  . ELIQUIS 5 MG TABS tablet TAKE 1 TABLET BY MOUTH TWICE DAILY. 60 tablet 6  . metoprolol (LOPRESSOR) 50  MG tablet TAKE (1) TABLET BY MOUTH TWICE DAILY. 180 tablet 1  . pantoprazole (PROTONIX) 40 MG tablet Take 40 mg by mouth every morning.     . pravastatin (PRAVACHOL) 40 MG tablet Take 40 mg by mouth at bedtime.     . ALPRAZolam (XANAX) 0.5 MG tablet Take 0.25-0.5 mg by mouth at bedtime. For sleep    . cholecalciferol (VITAMIN D) 1000 UNITS tablet Take 2,000 Units by mouth daily.     Marland Kitchen docusate sodium (COLACE) 100 MG capsule Take 100 mg by mouth daily as needed for mild constipation.    . furosemide (LASIX) 20 MG tablet Take 1 tablet (20 mg total) by mouth daily. (Patient taking differently: Take 20 mg by mouth daily as needed for fluid or edema. ) 90 tablet 3  . HYDROcodone-acetaminophen (NORCO/VICODIN) 5-325 MG per tablet Take 1 tablet by mouth every 6 (six) hours as needed for moderate pain. 60 tablet 0  . ketotifen (THERA TEARS ALLERGY) 0.025 % ophthalmic solution Place 1 drop into both eyes daily as needed (for irritation).     . Multiple Vitamins-Minerals (PRESERVISION AREDS 2 PO) Take 1 capsule by mouth 2 (two) times daily.    . promethazine (PHENERGAN) 12.5 MG tablet Take 1 tablet (12.5 mg total) by mouth every 6 (six) hours as needed for nausea or vomiting. 60 tablet 0  . traMADol (ULTRAM) 50 MG tablet Take 0.5-1 tablets (25-50 mg total) by mouth every 6 (six) hours as needed. 20 tablet 0   No current facility-administered medications for this visit.       Physical Exam Blood pressure 140/84, height 5\' 2"  (1.575 m), weight 143 lb (64.864 kg). Physical Exam The patient is well developed well nourished and well groomed. Orientation to person place and time is normal  Mood is pleasant. Ambulatory status She is an independent ambulator out of the house walks normally other extremities are normal  Her left wrist is deformed she has classic Colles' fracture deformity tenderness ecchymosis of the skin she's neurovascularly intact her motor exam is normal she has no wrist instability elbows  normal shoulder is normal and she has painful abnormal range of motion in the wrist*

## 2014-11-29 NOTE — Addendum Note (Signed)
Addended by: Arther Abbott E on: 11/29/2014 01:08 PM   Modules accepted: Orders

## 2014-11-29 NOTE — Patient Instructions (Addendum)
    RONEKA GILPIN  11/29/2014     Your procedure is scheduled on 12/04/14  Report to [facility] at 8:45 AM  Call this number if you have problems the morning of surgery:  [phone]   Remember:  Do not eat food or drink liquids after midnight.  Take these medicines the morning of surgery with A SIP OF WATER: Diltiazem, Metoprolol and Pantoprazole. You may take your Hydrocodone if needed.   Do not wear jewelry, make-up or nail polish.  Do not wear lotions, powders, or perfumes.  You may wear deodorant.  Do not shave 48 hours prior to surgery.  Men may shave face and neck.  Do not bring valuables to the hospital.  [facility] is not responsible for any belongings or valuables.  Contacts, dentures or bridgework may not be worn into surgery.  Leave your suitcase in the car.  After surgery it may be brought to your room.  For patients admitted to the hospital, discharge time will be determined by your treatment team.  Patients discharged the day of surgery will not be allowed to drive home.   Special instructions:  Shower using Hibiclens (CHG bath) the night before surgery and the morning of surgery.  Please read over the following fact sheets that you were given. Anesthesia Post-op Instructions    PATIENT INSTRUCTIONS POST-ANESTHESIA  IMMEDIATELY FOLLOWING SURGERY:  Do not drive or operate machinery for the first twenty four hours after surgery.  Do not make any important decisions for twenty four hours after surgery or while taking narcotic pain medications or sedatives.  If you develop intractable nausea and vomiting or a severe headache please notify your doctor immediately.  FOLLOW-UP:  Please make an appointment with your surgeon as instructed. You do not need to follow up with anesthesia unless specifically instructed to do so.  WOUND CARE INSTRUCTIONS (if applicable):  Keep a dry clean dressing on the anesthesia/puncture wound site if there is drainage.  Once the wound has  quit draining you may leave it open to air.  Generally you should leave the bandage intact for twenty four hours unless there is drainage.  If the epidural site drains for more than 36-48 hours please call the anesthesia department.  QUESTIONS?:  Please feel free to call your physician or the hospital operator if you have any questions, and they will be happy to assist you.

## 2014-12-02 ENCOUNTER — Encounter (HOSPITAL_COMMUNITY)
Admission: RE | Admit: 2014-12-02 | Discharge: 2014-12-02 | Disposition: A | Discharge status: Home / Self Care | Payer: PPO | Source: Ambulatory Visit | Attending: Orthopedic Surgery | Admitting: Orthopedic Surgery

## 2014-12-02 ENCOUNTER — Encounter (HOSPITAL_COMMUNITY): Payer: Self-pay

## 2014-12-02 ENCOUNTER — Other Ambulatory Visit: Payer: Self-pay

## 2014-12-02 ENCOUNTER — Ambulatory Visit (HOSPITAL_COMMUNITY)
Admission: RE | Admit: 2014-12-02 | Discharge: 2014-12-02 | Disposition: A | Discharge status: Home / Self Care | Payer: PPO | Source: Ambulatory Visit | Attending: Orthopedic Surgery | Admitting: Orthopedic Surgery

## 2014-12-02 DIAGNOSIS — E785 Hyperlipidemia, unspecified: Secondary | ICD-10-CM | POA: Diagnosis not present

## 2014-12-02 DIAGNOSIS — Z01818 Encounter for other preprocedural examination: Secondary | ICD-10-CM

## 2014-12-02 HISTORY — DX: Solitary pulmonary nodule: R91.1

## 2014-12-02 LAB — CBC WITH DIFFERENTIAL/PLATELET
BASOS ABS: 0 10*3/uL (ref 0.0–0.1)
Basophils Relative: 0 % (ref 0–1)
EOS ABS: 0.1 10*3/uL (ref 0.0–0.7)
Eosinophils Relative: 1 % (ref 0–5)
HEMATOCRIT: 43.1 % (ref 36.0–46.0)
HEMOGLOBIN: 14.1 g/dL (ref 12.0–15.0)
LYMPHS PCT: 21 % (ref 12–46)
Lymphs Abs: 1.1 10*3/uL (ref 0.7–4.0)
MCH: 31.3 pg (ref 26.0–34.0)
MCHC: 32.7 g/dL (ref 30.0–36.0)
MCV: 95.6 fL (ref 78.0–100.0)
MONO ABS: 0.6 10*3/uL (ref 0.1–1.0)
MONOS PCT: 11 % (ref 3–12)
NEUTROS ABS: 3.5 10*3/uL (ref 1.7–7.7)
Neutrophils Relative %: 67 % (ref 43–77)
Platelets: 207 10*3/uL (ref 150–400)
RBC: 4.51 MIL/uL (ref 3.87–5.11)
RDW: 13.5 % (ref 11.5–15.5)
WBC: 5.3 10*3/uL (ref 4.0–10.5)

## 2014-12-02 LAB — BASIC METABOLIC PANEL
Anion gap: 10 (ref 5–15)
BUN: 8 mg/dL (ref 6–20)
CHLORIDE: 101 mmol/L (ref 101–111)
CO2: 28 mmol/L (ref 22–32)
CREATININE: 0.73 mg/dL (ref 0.44–1.00)
Calcium: 9.5 mg/dL (ref 8.9–10.3)
GFR calc Af Amer: >60 mL/min (ref >60)
GFR calc non Af Amer: >60 mL/min (ref >60)
GLUCOSE: 97 mg/dL (ref 65–99)
Potassium: 4.3 mmol/L (ref 3.5–5.1)
SODIUM: 139 mmol/L (ref 135–145)

## 2014-12-02 LAB — TYPE AND SCREEN
ABO/RH(D): O POS
Antibody Screen: NEGATIVE

## 2014-12-02 LAB — PROTIME-INR
INR: 1.09 (ref 0.00–1.49)
Prothrombin Time: 14.3 seconds (ref 11.6–15.2)

## 2014-12-02 LAB — APTT: APTT: 29 s (ref 24–37)

## 2014-12-02 MED ORDER — CHLORHEXIDINE GLUCONATE 4 % EX LIQD: 60.0000 mL | Freq: Once | CUTANEOUS | Status: DC

## 2014-12-03 ENCOUNTER — Ambulatory Visit (HOSPITAL_COMMUNITY): Payer: PPO

## 2014-12-03 NOTE — H&P (Signed)
Patient ID: Marcia Woods, female   DOB: [birth year], 81 y.o.   MRN: [id number]    Chief Complaint   Patient presents with   .  Wrist Injury       er follow up left wrist fracture, DOI 11/27/14      [facility] imaging multiple views left wrist dorsal displaced distal radius fracture closed independent interpretation agree.Marland Kitchendx   Diagnosis Encounter Diagnosis   Name  Primary?   .  Distal radius fracture, left, closed, initial encounter  Yes       ManagementI offered the patient nonoperative treatment with the risk of loss of flexion extension and loss of rotational motion. She opted for surgical management with a risk of infection and hardware complication  Cardiology has been consult and they recommended stopping the left inner 48 hours prior to surgery  We've done that  The patient will have open reduction internal fixation of the left wrist with a DVR plate    Marcia Woods is a 81 y.o. female.   HPI 81 year old female fell in her garden no history of loss of consciousness dizziness. Just tripped and fell. She complains of left wrist pain aching, severe, constant, times one day. Associated symptoms nausea and vomiting. She is allergic to codeine but it's more of her reaction with nausea vomiting. She took tramadol pain relief not sufficient.  History of system review shows patient has no major problems at this point her heart rate and symptoms are under control she does have atrial fibrillation. She has no fever no chills. There was no skin breaks. Other review of systems normal   Review of Systems:  See hpi    Past Medical History   Diagnosis  Date   .  Hyperlipemia     .  GERD (gastroesophageal reflux disease)     .  Macular degeneration     .  Constipation     .  Anxiety     .  Arthritis     .  Vaginal atrophy     .  PSVT (paroxysmal supraventricular tachycardia)  03/05/2013   .  Ankle fracture  05/22/2013   .  Atrial fibrillation     .  Vaginal odor   01/29/2014       Past Surgical History   Procedure  Laterality  Date   .  Knee arthroscopy    2010       left-[facility]   .  Trigger finger release           right   .  Hemorrhoid surgery       .  Cataract surgery           bilaterally   .  Abdominal hysterectomy       .  Colonoscopy    04/30/2011       Procedure: COLONOSCOPY;  Surgeon: Rogene Houston, MD;  [facility];  Service: Endoscopy;  Laterality: N/A;  8:30 am   .  Esophagogastroduodenoscopy    05/27/2011       Procedure: ESOPHAGOGASTRODUODENOSCOPY (EGD);  Surgeon: Rogene Houston, MD;  [facility];  Service: Endoscopy;  Laterality: N/A;  300   .  Cervical disc surgery       .  Cholecystectomy    01/26/2012       Procedure: LAPAROSCOPIC CHOLECYSTECTOMY;  Surgeon: Jamesetta So, MD;  [facility];  Service: General;  Laterality: N/A;   .  Video bronchoscopy  with endobronchial navigation  N/A  06/11/2013       Procedure: VIDEO BRONCHOSCOPY WITH ENDOBRONCHIAL NAVIGATION;  Surgeon: Grace Isaac, MD;  Location: [place];  Service: Thoracic;  Laterality: N/A;   .  Endobronchial ultrasound  N/A  06/11/2013       Procedure: ENDOBRONCHIAL ULTRASOUND;  Surgeon: Grace Isaac, MD;  Location[facility];  Service: Thoracic;  Laterality: N/A;   .  Bronchial biopsy  N/A  06/11/2013       Procedure: TRANSBRONCHIAL BIOPSIES;  Surgeon: Grace Isaac, MD;  Location[facility] OR;  Service: Thoracic;  Laterality: N/A;       Family History   Problem  Relation  Age of Onset   .  Colon cancer  Neg Hx     .  Heart disease  Mother     .  Heart disease  Father     .  Cancer  Son         prostate   .  Cancer  Son         throat     Social History History   Substance Use Topics   .  Smoking status:  Never Smoker    .  Smokeless tobacco:  Never Used   .  Alcohol Use:  No       Allergies   Allergen  Reactions   .  Codeine  Nausea And Vomiting       Current Outpatient Prescriptions   Medication  Sig  Dispense   Refill   .  diltiazem (CARDIZEM CD) 240 MG 24 hr capsule  TAKE (1) CAPSULE BY MOUTH ONCE DAILY.  90 capsule  1   .  ELIQUIS 5 MG TABS tablet  TAKE 1 TABLET BY MOUTH TWICE DAILY.  60 tablet  6   .  metoprolol (LOPRESSOR) 50 MG tablet  TAKE (1) TABLET BY MOUTH TWICE DAILY.  180 tablet  1   .  pantoprazole (PROTONIX) 40 MG tablet  Take 40 mg by mouth every morning.        .  pravastatin (PRAVACHOL) 40 MG tablet  Take 40 mg by mouth at bedtime.        .  ALPRAZolam (XANAX) 0.5 MG tablet  Take 0.25-0.5 mg by mouth at bedtime. For sleep       .  cholecalciferol (VITAMIN D) 1000 UNITS tablet  Take 2,000 Units by mouth daily.        Marland Kitchen  docusate sodium (COLACE) 100 MG capsule  Take 100 mg by mouth daily as needed for mild constipation.       .  furosemide (LASIX) 20 MG tablet  Take 1 tablet (20 mg total) by mouth daily. (Patient taking differently: Take 20 mg by mouth daily as needed for fluid or edema. )  90 tablet  3   .  HYDROcodone-acetaminophen (NORCO/VICODIN) 5-325 MG per tablet  Take 1 tablet by mouth every 6 (six) hours as needed for moderate pain.  60 tablet  0   .  ketotifen (THERA TEARS ALLERGY) 0.025 % ophthalmic solution  Place 1 drop into both eyes daily as needed (for irritation).        .  Multiple Vitamins-Minerals (PRESERVISION AREDS 2 PO)  Take 1 capsule by mouth 2 (two) times daily.       .  promethazine (PHENERGAN) 12.5 MG tablet  Take 1 tablet (12.5 mg total) by mouth every 6 (six) hours as needed for nausea or vomiting.  60 tablet  0   .  traMADol (ULTRAM) 50 MG tablet  Take 0.5-1 tablets (25-50 mg total) by mouth every 6 (six) hours as needed.  20 tablet  0      No current facility-administered medications for this visit.        Physical Exam Blood pressure 140/84, height 5\' 2"  (1.575 m), weight 143 lb (64.864 kg). Physical Exam The patient is well developed well nourished and well groomed. Orientation to person place and time is normal   Mood is pleasant. Ambulatory status  She is an independent ambulator out of the house walks normally other extremities are normal  Her left wrist is deformed she has classic Colles' fracture deformity tenderness ecchymosis of the skin she's neurovascularly intact her motor exam is normal she has no wrist instability elbows normal shoulder is normal and she has painful abnormal range of motion in the wrist*

## 2014-12-04 ENCOUNTER — Ambulatory Visit (HOSPITAL_COMMUNITY): Payer: PPO | Admitting: Anesthesiology

## 2014-12-04 ENCOUNTER — Ambulatory Visit (HOSPITAL_COMMUNITY)
Admission: RE | Admit: 2014-12-04 | Discharge: 2014-12-04 | Disposition: A | Discharge status: Home / Self Care | Payer: PPO | Source: Ambulatory Visit | Attending: Orthopedic Surgery | Admitting: Orthopedic Surgery

## 2014-12-04 ENCOUNTER — Encounter (HOSPITAL_COMMUNITY): Payer: Self-pay | Admitting: *Deleted

## 2014-12-04 ENCOUNTER — Telehealth: Payer: Self-pay | Admitting: Orthopedic Surgery

## 2014-12-04 ENCOUNTER — Ambulatory Visit (HOSPITAL_COMMUNITY): Payer: PPO

## 2014-12-04 ENCOUNTER — Encounter (HOSPITAL_COMMUNITY)
Admission: RE | Disposition: A | Discharge status: Home / Self Care | Payer: Self-pay | Source: Ambulatory Visit | Attending: Orthopedic Surgery

## 2014-12-04 DIAGNOSIS — Z7901 Long term (current) use of anticoagulants: Secondary | ICD-10-CM | POA: Insufficient documentation

## 2014-12-04 DIAGNOSIS — Z79899 Other long term (current) drug therapy: Secondary | ICD-10-CM | POA: Diagnosis not present

## 2014-12-04 DIAGNOSIS — M1990 Unspecified osteoarthritis, unspecified site: Secondary | ICD-10-CM | POA: Diagnosis not present

## 2014-12-04 DIAGNOSIS — S52509A Unspecified fracture of the lower end of unspecified radius, initial encounter for closed fracture: Secondary | ICD-10-CM | POA: Insufficient documentation

## 2014-12-04 DIAGNOSIS — H3530 Unspecified macular degeneration: Secondary | ICD-10-CM | POA: Insufficient documentation

## 2014-12-04 DIAGNOSIS — Y999 Unspecified external cause status: Secondary | ICD-10-CM | POA: Insufficient documentation

## 2014-12-04 DIAGNOSIS — Y92096 Garden or yard of other non-institutional residence as the place of occurrence of the external cause: Secondary | ICD-10-CM | POA: Diagnosis not present

## 2014-12-04 DIAGNOSIS — W010XXA Fall on same level from slipping, tripping and stumbling without subsequent striking against object, initial encounter: Secondary | ICD-10-CM | POA: Insufficient documentation

## 2014-12-04 DIAGNOSIS — E785 Hyperlipidemia, unspecified: Secondary | ICD-10-CM | POA: Insufficient documentation

## 2014-12-04 DIAGNOSIS — F419 Anxiety disorder, unspecified: Secondary | ICD-10-CM | POA: Insufficient documentation

## 2014-12-04 DIAGNOSIS — Z79891 Long term (current) use of opiate analgesic: Secondary | ICD-10-CM | POA: Insufficient documentation

## 2014-12-04 DIAGNOSIS — K219 Gastro-esophageal reflux disease without esophagitis: Secondary | ICD-10-CM | POA: Insufficient documentation

## 2014-12-04 DIAGNOSIS — I4891 Unspecified atrial fibrillation: Secondary | ICD-10-CM | POA: Insufficient documentation

## 2014-12-04 DIAGNOSIS — Y939 Activity, unspecified: Secondary | ICD-10-CM | POA: Diagnosis not present

## 2014-12-04 DIAGNOSIS — S52502A Unspecified fracture of the lower end of left radius, initial encounter for closed fracture: Secondary | ICD-10-CM | POA: Insufficient documentation

## 2014-12-04 HISTORY — PX: ORIF WRIST FRACTURE: SHX2133

## 2014-12-04 SURGERY — OPEN REDUCTION INTERNAL FIXATION (ORIF) WRIST FRACTURE
Anesthesia: General | Site: Wrist | Laterality: Left

## 2014-12-04 MED ORDER — GLYCOPYRROLATE 0.2 MG/ML IJ SOLN
INTRAMUSCULAR | Status: AC
  Filled 2014-12-04: qty 3

## 2014-12-04 MED ORDER — LIDOCAINE HCL (PF) 1 % IJ SOLN
INTRAMUSCULAR | Status: AC
  Filled 2014-12-04: qty 5

## 2014-12-04 MED ORDER — LACTATED RINGERS IV SOLN
INTRAVENOUS | Status: DC
  Administered 2014-12-04: 1000 mL via INTRAVENOUS

## 2014-12-04 MED ORDER — FENTANYL CITRATE (PF) 100 MCG/2ML IJ SOLN
INTRAMUSCULAR | Status: AC
  Filled 2014-12-04: qty 2

## 2014-12-04 MED ORDER — BUPIVACAINE-EPINEPHRINE (PF) 0.5% -1:200000 IJ SOLN
INTRAMUSCULAR | Status: AC
  Filled 2014-12-04: qty 30

## 2014-12-04 MED ORDER — SODIUM CHLORIDE 0.9 % IR SOLN
Status: DC | PRN
  Administered 2014-12-04: 1000 mL

## 2014-12-04 MED ORDER — ETOMIDATE 2 MG/ML IV SOLN
INTRAVENOUS | Status: AC
  Filled 2014-12-04: qty 10

## 2014-12-04 MED ORDER — PROPOFOL 10 MG/ML IV BOLUS
INTRAVENOUS | Status: DC | PRN
  Administered 2014-12-04: 60 mg via INTRAVENOUS

## 2014-12-04 MED ORDER — SUCCINYLCHOLINE CHLORIDE 20 MG/ML IJ SOLN
INTRAMUSCULAR | Status: AC
  Filled 2014-12-04: qty 1

## 2014-12-04 MED ORDER — PHENYLEPHRINE HCL 10 MG/ML IJ SOLN
INTRAMUSCULAR | Status: AC
  Filled 2014-12-04: qty 1

## 2014-12-04 MED ORDER — HYDROCODONE-ACETAMINOPHEN 5-325 MG PO TABS: 1.0000 | ORAL_TABLET | Freq: Four times a day (QID) | ORAL | Status: DC | PRN

## 2014-12-04 MED ORDER — CEFAZOLIN SODIUM-DEXTROSE 2-3 GM-% IV SOLR
2.0000 g | INTRAVENOUS | Status: AC
Start: 2014-12-04 — End: 2014-12-04
  Administered 2014-12-04: 2 g via INTRAVENOUS
  Filled 2014-12-04: qty 50

## 2014-12-04 MED ORDER — PROMETHAZINE HCL 12.5 MG PO TABS: 12.5000 mg | ORAL_TABLET | Freq: Four times a day (QID) | ORAL | Status: DC | PRN

## 2014-12-04 MED ORDER — BUPIVACAINE-EPINEPHRINE (PF) 0.5% -1:200000 IJ SOLN
INTRAMUSCULAR | Status: DC | PRN
  Administered 2014-12-04: 30 mL via PERINEURAL

## 2014-12-04 MED ORDER — MIDAZOLAM HCL 2 MG/2ML IJ SOLN
1.0000 mg | INTRAMUSCULAR | Status: DC | PRN
  Administered 2014-12-04 (×2): 2 mg via INTRAVENOUS
  Filled 2014-12-04 (×2): qty 2

## 2014-12-04 MED ORDER — DEXAMETHASONE SODIUM PHOSPHATE 4 MG/ML IJ SOLN
4.0000 mg | Freq: Once | INTRAMUSCULAR | Status: AC
  Administered 2014-12-04: 4 mg via INTRAVENOUS
  Filled 2014-12-04: qty 1

## 2014-12-04 MED ORDER — ONDANSETRON HCL 4 MG/2ML IJ SOLN
4.0000 mg | Freq: Once | INTRAMUSCULAR | Status: AC
  Administered 2014-12-04: 4 mg via INTRAVENOUS
  Filled 2014-12-04: qty 2

## 2014-12-04 MED ORDER — FENTANYL CITRATE (PF) 100 MCG/2ML IJ SOLN
25.0000 ug | INTRAMUSCULAR | Status: DC | PRN
  Administered 2014-12-04 (×2): 50 ug via INTRAVENOUS

## 2014-12-04 MED ORDER — LIDOCAINE HCL (CARDIAC) 20 MG/ML IV SOLN
INTRAVENOUS | Status: DC | PRN
  Administered 2014-12-04: 25 mg via INTRAVENOUS

## 2014-12-04 MED ORDER — SODIUM CHLORIDE 0.9 % IJ SOLN
INTRAMUSCULAR | Status: AC
  Filled 2014-12-04: qty 20

## 2014-12-04 MED ORDER — FENTANYL CITRATE (PF) 100 MCG/2ML IJ SOLN
INTRAMUSCULAR | Status: DC | PRN
Start: 2014-12-04 — End: 2014-12-04
  Administered 2014-12-04 (×4): 25 ug via INTRAVENOUS

## 2014-12-04 MED ORDER — NEOSTIGMINE METHYLSULFATE 10 MG/10ML IV SOLN
INTRAVENOUS | Status: AC
  Filled 2014-12-04: qty 1

## 2014-12-04 MED ORDER — APIXABAN 5 MG PO TABS: 5.0000 mg | ORAL_TABLET | Freq: Two times a day (BID) | ORAL | Status: DC

## 2014-12-04 MED ORDER — PHENYLEPHRINE HCL 10 MG/ML IJ SOLN
INTRAMUSCULAR | Status: DC | PRN
  Administered 2014-12-04: 100 ug via INTRAVENOUS

## 2014-12-04 MED ORDER — PROPOFOL 10 MG/ML IV BOLUS
INTRAVENOUS | Status: AC
  Filled 2014-12-04: qty 20

## 2014-12-04 MED ORDER — ONDANSETRON HCL 4 MG/2ML IJ SOLN: 4.0000 mg | Freq: Once | INTRAMUSCULAR | Status: DC | PRN

## 2014-12-04 SURGICAL SUPPLY — 56 items
BAG HAMPER (MISCELLANEOUS) ×3 IMPLANT
BANDAGE ELASTIC 3 VELCRO NS (GAUZE/BANDAGES/DRESSINGS) ×3 IMPLANT
BANDAGE ELASTIC 3 VELCRO ST LF (GAUZE/BANDAGES/DRESSINGS) IMPLANT
BANDAGE ESMARK 4X12 BL STRL LF (DISPOSABLE) ×1 IMPLANT
BIT DRILL 2 FAST STEP (BIT) ×3 IMPLANT
BIT DRILL 2.5X4 QC (BIT) ×3 IMPLANT
BNDG CMPR 12X4 ELC STRL LF (DISPOSABLE) ×1
BNDG COHESIVE 4X5 TAN STRL (GAUZE/BANDAGES/DRESSINGS) ×3 IMPLANT
BNDG ESMARK 4X12 BLUE STRL LF (DISPOSABLE) ×3
CHLORAPREP W/TINT 26ML (MISCELLANEOUS) ×3 IMPLANT
CLOTH BEACON ORANGE TIMEOUT ST (SAFETY) ×3 IMPLANT
COVER LIGHT HANDLE STERIS (MISCELLANEOUS) ×6 IMPLANT
COVER PROBE W GEL 5X96 (DRAPES) ×3 IMPLANT
CUFF TOURNIQUET SINGLE 18IN (TOURNIQUET CUFF) ×3 IMPLANT
DRAPE C-ARM FOLDED MOBILE STRL (DRAPES) ×3 IMPLANT
DRAPE PROXIMA HALF (DRAPES) ×3 IMPLANT
DRSG XEROFORM 1X8 (GAUZE/BANDAGES/DRESSINGS) ×3 IMPLANT
ELECT REM PT RETURN 9FT ADLT (ELECTROSURGICAL) ×3
ELECTRODE REM PT RTRN 9FT ADLT (ELECTROSURGICAL) ×1 IMPLANT
GAUZE SPONGE 4X4 12PLY STRL (GAUZE/BANDAGES/DRESSINGS) ×2 IMPLANT
GLOVE BIOGEL M 7.0 STRL (GLOVE) ×6 IMPLANT
GLOVE BIOGEL PI IND STRL 7.0 (GLOVE) ×2 IMPLANT
GLOVE BIOGEL PI INDICATOR 7.0 (GLOVE) ×4
GLOVE EXAM NITRILE MD LF STRL (GLOVE) ×6 IMPLANT
GLOVE SKINSENSE NS SZ8.0 LF (GLOVE) ×2
GLOVE SKINSENSE STRL SZ8.0 LF (GLOVE) ×1 IMPLANT
GLOVE SS N UNI LF 8.5 STRL (GLOVE) ×3 IMPLANT
GOWN STRL REUS W/TWL LRG LVL3 (GOWN DISPOSABLE) ×6 IMPLANT
GOWN STRL REUS W/TWL XL LVL3 (GOWN DISPOSABLE) ×3 IMPLANT
K-WIRE 1.6 (WIRE) ×3
K-WIRE FX5X1.6XNS BN SS (WIRE) ×1
KIT ROOM TURNOVER APOR (KITS) ×3 IMPLANT
KWIRE FX5X1.6XNS BN SS (WIRE) ×1 IMPLANT
MANIFOLD NEPTUNE II (INSTRUMENTS) ×3 IMPLANT
NEEDLE HYPO 21X1.5 SAFETY (NEEDLE) ×3 IMPLANT
NS IRRIG 1000ML POUR BTL (IV SOLUTION) ×3 IMPLANT
PACK BASIC LIMB (CUSTOM PROCEDURE TRAY) ×3 IMPLANT
PAD ARMBOARD 7.5X6 YLW CONV (MISCELLANEOUS) ×3 IMPLANT
PADDING WEBRIL 3 STERILE (GAUZE/BANDAGES/DRESSINGS) ×3 IMPLANT
PEG SUBCHONDRAL SMOOTH 2.0X22 (Peg) ×6 IMPLANT
PEG THREADED 2.5MMX18MM LONG (Peg) ×6 IMPLANT
PEG THREADED 2.5MMX20MM LONG (Peg) ×3 IMPLANT
PLATE SHORT 21.6X48.9 NRRW LT (Plate) ×3 IMPLANT
SCREW BN 12X3.5XNS CORT TI (Screw) ×3 IMPLANT
SCREW CORT 3.5X12 (Screw) ×9 IMPLANT
SET BASIN LINEN APH (SET/KITS/TRAYS/PACK) ×3 IMPLANT
SLING ARM MED ADULT FOAM STRAP (SOFTGOODS) ×3 IMPLANT
SPLINT IMMOBILIZER J 3INX20FT (CAST SUPPLIES) ×2
SPLINT J IMMOBILIZER 3X20FT (CAST SUPPLIES) ×1 IMPLANT
SPONGE GAUZE 4X4 12PLY (GAUZE/BANDAGES/DRESSINGS) ×3 IMPLANT
STAPLER VISISTAT 35W (STAPLE) ×3 IMPLANT
SUT ETHILON 3 0 FSL (SUTURE) IMPLANT
SUT MON AB 2-0 SH 27 (SUTURE) ×3
SUT MON AB 2-0 SH27 (SUTURE) ×1 IMPLANT
SYRINGE 10CC LL (SYRINGE) ×3 IMPLANT
WATER STERILE IRR 1000ML POUR (IV SOLUTION) ×3 IMPLANT

## 2014-12-04 NOTE — Telephone Encounter (Signed)
Regarding CPT codes [phone] and [phone], date of out-patient surgery as noted, no pre-authorization is required, per online [employer] portal 12/04/14.

## 2014-12-04 NOTE — Anesthesia Postprocedure Evaluation (Signed)
  Anesthesia Post-op Note  Patient: Marcia Woods  Procedure(s) Performed: Procedure(s): OPEN REDUCTION INTERNAL FIXATION LEFT WRIST FRACTURE (Left)  Patient Location: PACU  Anesthesia Type:General  Level of Consciousness: awake, alert , oriented and patient cooperative  Airway and Oxygen Therapy: Patient Spontanous Breathing  Post-op Pain: none  Post-op Assessment: Post-op Vital signs reviewed, Patient's Cardiovascular Status Stable, Respiratory Function Stable, Patent Airway, No signs of Nausea or vomiting and Pain level controlled              Post-op Vital Signs: Reviewed and stable  Last Vitals:  Filed Vitals:   12/04/14 1200  BP: 130/81  Pulse: 97  Temp:   Resp: 5    Complications: No apparent anesthesia complications

## 2014-12-04 NOTE — Telephone Encounter (Signed)
Regarding out-patient surgical case added today, 12/04/14, [facility] for OTIF/open treatment internal fixation/repair of wrist fracture, CPT [phone] - contacted [place], via web portal; requested expedited review and authorization.

## 2014-12-04 NOTE — Anesthesia Procedure Notes (Signed)
Procedure Name: LMA Insertion Date/Time: 12/04/2014 10:13 AM Performed by: Andree Elk, Kastiel Simonian A Pre-anesthesia Checklist: Timeout performed, Patient identified, Suction available, Patient being monitored and Emergency Drugs available Patient Re-evaluated:Patient Re-evaluated prior to inductionOxygen Delivery Method: Circle system utilized Preoxygenation: Pre-oxygenation with 100% oxygen Intubation Type: IV induction Ventilation: Mask ventilation without difficulty LMA: LMA inserted LMA Size: 4.0 Number of attempts: 2 Placement Confirmation: positive ETCO2 and breath sounds checked- equal and bilateral Tube secured with: Tape Dental Injury: Teeth and Oropharynx as per pre-operative assessment  Comments: LMA 3 placed initially,but too small; replaced with LMA 4 without difficulty; VSS throughout;

## 2014-12-04 NOTE — Transfer of Care (Signed)
Immediate Anesthesia Transfer of Care Note  Patient: Marcia Woods  Procedure(s) Performed: Procedure(s): OPEN REDUCTION INTERNAL FIXATION LEFT WRIST FRACTURE (Left)  Patient Location: PACU  Anesthesia Type:General  Level of Consciousness: awake  Airway & Oxygen Therapy: Patient Spontanous Breathing and Patient connected to face mask oxygen  Post-op Assessment: Report given to RN and Post -op Vital signs reviewed and stable  Post vital signs: Reviewed and stable  Last Vitals:  Filed Vitals:   12/04/14 1000  BP: 118/83  Pulse:   Temp:   Resp: 14    Complications: No apparent anesthesia complications

## 2014-12-04 NOTE — Op Note (Signed)
12/04/2014  11:43 AM  PATIENT:  Marcia Woods  81 y.o. female  PRE-OPERATIVE DIAGNOSIS:  left distal radius fracture  POST-OPERATIVE DIAGNOSIS:  left distal radius fracture  PROCEDURE:  Procedure(s): OPEN REDUCTION INTERNAL FIXATION LEFT WRIST FRACTURE (Left)   DVR PLATE  3 CORTICAL SCREWS PROXIMALLY 3 PART. THREADED PEGS  2 SMOOTH PEGS  SURGEON:  Surgeon(s) and Role:    * Carole Civil, MD - Primary  PHYSICIAN ASSISTANT:   ASSISTANTS: Simonne Maffucci   ANESTHESIA:   general  EBL:  Total I/O In: 1000 [I.V.:1000] Out: 5 [Blood:5]  BLOOD ADMINISTERED:none  DRAINS: none   LOCAL MEDICATIONS USED:  MARCAINE     SPECIMEN:  No Specimen  DISPOSITION OF SPECIMEN:  N/A  COUNTS:  YES  TOURNIQUET:   Total Tourniquet Time Documented: Upper Arm (Left) - 59 minutes Total: Upper Arm (Left) - 59 minutes   DICTATION: .Viviann Spare Dictation  PLAN OF CARE: Discharge to home after PACU  PATIENT DISPOSITION:  PACU - hemodynamically stable.   Delay start of Pharmacological VTE agent (>24hrs) due to surgical blood loss or risk of bleeding: not applicable  Procedure details  The patient was identified in the preoperative holding area as Mrs. Clorissa Gruenberg. Her site was marked and confirmed as left wrist. We updated her records and took her to surgery. She had Ancef for IV and a biotics. She was placed under general anesthesia with intubation.  The left arm was prepped and draped sterilely with) prep  The timeout procedure was completed the OR team agreed that the implants were available and I checked them, the x-rays were up and available for viewing and were reviewed and the surgical site was confirmed  The limb was exsanguinated with a 4 inch Esmarch the tourniquet was elevated to 250 mmHg. The volar approach of Mallie Mussel was made over the flexor carpi radialis tendon. Subcutaneous tissue was divided. Tendon was identified and tendon sheath was opened. The tendon was retracted  radially. The floor of the tendon sheath was opened and the flexor tendons and muscles were retracted ulnarly. The pronator quadratus was elevated and the bone was exposed. The fracture was reduced and held with a K wire. The plate was applied to the distal fragment first and a K wire was passed to confirm path of the screws and pegs once this was satisfactory we placed 3 threaded pegs in the proximal row to bring the bone to the plate and then placed 2 smooth pegs distally and confirmed x-ray position  The plate was then brought down to the bone thereby reproducing the volar tilt and 3 cortical screws were placed.  Final x-rays were obtained and the fracture was reduced plate was in good position and volar tilt was restored  The wound was irrigated and closed with 2-0 Monocryl and staples. We injected 20 mL of Marcaine.  A placed a volar splint  We released the tourniquet the fingers have good viability and the patient was extubated and taken to recovery room in stable condition

## 2014-12-04 NOTE — Brief Op Note (Signed)
12/04/2014  11:43 AM  PATIENT:  Marcia Woods  81 y.o. female  PRE-OPERATIVE DIAGNOSIS:  left distal radius fracture  POST-OPERATIVE DIAGNOSIS:  left distal radius fracture  PROCEDURE:  Procedure(s): OPEN REDUCTION INTERNAL FIXATION LEFT WRIST FRACTURE (Left)   DVR PLATE  3 CORTICAL SCREWS PROXIMALLY 3 PART. THREADED PEGS  2 SMOOTH PEGS  SURGEON:  Surgeon(s) and Role:    * Carole Civil, MD - Primary  PHYSICIAN ASSISTANT:   ASSISTANTS: Simonne Maffucci   ANESTHESIA:   general  EBL:  Total I/O In: 1000 [I.V.:1000] Out: 5 [Blood:5]  BLOOD ADMINISTERED:none  DRAINS: none   LOCAL MEDICATIONS USED:  MARCAINE     SPECIMEN:  No Specimen  DISPOSITION OF SPECIMEN:  N/A  COUNTS:  YES  TOURNIQUET:   Total Tourniquet Time Documented: Upper Arm (Left) - 59 minutes Total: Upper Arm (Left) - 59 minutes   DICTATION: .Viviann Spare Dictation  PLAN OF CARE: Discharge to home after PACU  PATIENT DISPOSITION:  PACU - hemodynamically stable.   Delay start of Pharmacological VTE agent (>24hrs) due to surgical blood loss or risk of bleeding: not applicable  Procedure details  The patient was identified in the preoperative holding area as Marcia Woods. Her site was marked and confirmed as left wrist. We updated her records and took her to surgery. She had Ancef for IV and a biotics. She was placed under general anesthesia with intubation.  The left arm was prepped and draped sterilely with) prep  The timeout procedure was completed the OR team agreed that the implants were available and I checked them, the x-rays were up and available for viewing and were reviewed and the surgical site was confirmed  The limb was exsanguinated with a 4 inch Esmarch the tourniquet was elevated to 250 mmHg. The volar approach of Mallie Mussel was made over the flexor carpi radialis tendon. Subcutaneous tissue was divided. Tendon was identified and tendon sheath was opened. The tendon was retracted  radially. The floor of the tendon sheath was opened and the flexor tendons and muscles were retracted ulnarly. The pronator quadratus was elevated and the bone was exposed. The fracture was reduced and held with a K wire. The plate was applied to the distal fragment first and a K wire was passed to confirm path of the screws and pegs once this was satisfactory we placed 3 threaded pegs in the proximal row to bring the bone to the plate and then placed 2 smooth pegs distally and confirmed x-ray position  The plate was then brought down to the bone thereby reproducing the volar tilt and 3 cortical screws were placed.  Final x-rays were obtained and the fracture was reduced plate was in good position and volar tilt was restored  The wound was irrigated and closed with 2-0 Monocryl and staples. We injected 20 mL of Marcaine.  A placed a volar splint  We released the tourniquet the fingers have good viability and the patient was extubated and taken to recovery room in stable condition

## 2014-12-04 NOTE — Anesthesia Preprocedure Evaluation (Signed)
Anesthesia Evaluation  Patient identified by MRN, date of birth, ID band Patient awake    Reviewed: Allergy & Precautions, H&P , NPO status , Patient's Chart, lab work & pertinent test results, reviewed documented beta blocker date and time   History of Anesthesia Complications (+) PONVNegative for: history of anesthetic complications  Airway Mallampati: I  TM Distance: >3 FB Neck ROM: Full    Dental  (+) Edentulous Upper, Edentulous Lower   Pulmonary shortness of breath and with exertion,    Pulmonary exam normal + decreased breath sounds      Cardiovascular + dysrhythmias Atrial Fibrillation Rhythm:Irregular Rate:Normal     Neuro/Psych PSYCHIATRIC DISORDERS Anxiety negative psych ROS   GI/Hepatic negative GI ROS, Neg liver ROS, hiatal hernia, GERD-  Medicated and Controlled,  Endo/Other  negative endocrine ROS  Renal/GU negative Renal ROS     Musculoskeletal   Abdominal Normal abdominal exam  (+)   Peds  Hematology   Anesthesia Other Findings   Reproductive/Obstetrics                             Anesthesia Physical Anesthesia Plan  ASA: III  Anesthesia Plan: General   Post-op Pain Management:    Induction: Intravenous  Airway Management Planned: LMA  Additional Equipment:   Intra-op Plan:   Post-operative Plan: Extubation in OR  Informed Consent: I have reviewed the patients History and Physical, chart, labs and discussed the procedure including the risks, benefits and alternatives for the proposed anesthesia with the patient or authorized representative who has indicated his/her understanding and acceptance.     Plan Discussed with:   Anesthesia Plan Comments:         Anesthesia Quick Evaluation

## 2014-12-04 NOTE — Interval H&P Note (Signed)
History and Physical Interval Note:  12/04/2014 9:48 AM  Marcia Woods  has presented today for surgery, with the diagnosis of left distal radius radius  The various methods of treatment have been discussed with the patient and family. After consideration of risks, benefits and other options for treatment, the patient has consented to  Procedure(s): OPEN REDUCTION INTERNAL FIXATION (ORIF) LEFT WRIST FRACTURE as a surgical intervention .  The patient's history has been reviewed, patient examined, no change in status, stable for surgery.  I have reviewed the patient's chart and labs.  Questions were answered to the patient's satisfaction.     Arther Abbott

## 2014-12-05 ENCOUNTER — Encounter (HOSPITAL_COMMUNITY): Payer: Self-pay | Admitting: Orthopedic Surgery

## 2014-12-09 ENCOUNTER — Encounter: Payer: Self-pay | Admitting: Orthopedic Surgery

## 2014-12-09 ENCOUNTER — Ambulatory Visit (INDEPENDENT_AMBULATORY_CARE_PROVIDER_SITE_OTHER): Payer: PPO | Admitting: Orthopedic Surgery

## 2014-12-09 VITALS — BP 107/48 | Ht 63.0 in | Wt 144.0 lb

## 2014-12-09 DIAGNOSIS — Z4789 Encounter for other orthopedic aftercare: Secondary | ICD-10-CM

## 2014-12-09 DIAGNOSIS — S52502D Unspecified fracture of the lower end of left radius, subsequent encounter for closed fracture with routine healing: Secondary | ICD-10-CM

## 2014-12-09 NOTE — Progress Notes (Signed)
Patient ID: Marcia Woods, female   DOB: July 06, 1925, 81 y.o.   MRN: [id number]  Chief Complaint  Patient presents with  . Follow-up    Post op #1, left wrist fracture, wound check and splint change.    Encounter Diagnoses  Name Primary?  . Distal radius fracture, left, closed, with routine healing, subsequent encounter   . Surgical aftercare, musculoskeletal system Yes    The surgical site is clean.  The new splint is made. The patient has instructions to move fingers as tolerated return for x-ray, staples out, x-rays and then application of Velcro splint

## 2014-12-17 ENCOUNTER — Ambulatory Visit (INDEPENDENT_AMBULATORY_CARE_PROVIDER_SITE_OTHER): Payer: PPO

## 2014-12-17 ENCOUNTER — Ambulatory Visit (INDEPENDENT_AMBULATORY_CARE_PROVIDER_SITE_OTHER): Payer: Self-pay | Admitting: Orthopedic Surgery

## 2014-12-17 VITALS — BP 122/85 | Ht 63.0 in | Wt 144.0 lb

## 2014-12-17 DIAGNOSIS — Z4789 Encounter for other orthopedic aftercare: Secondary | ICD-10-CM

## 2014-12-17 DIAGNOSIS — S62102D Fracture of unspecified carpal bone, left wrist, subsequent encounter for fracture with routine healing: Secondary | ICD-10-CM | POA: Diagnosis not present

## 2014-12-17 NOTE — Progress Notes (Signed)
Patient ID: Marcia Woods, female   DOB: Nov 10, 1925, 81 y.o.   MRN: [id number] Chief Complaint  Patient presents with  . Follow-up    post op 2, left wrist + staple removal, DOS 12/04/14    BP 122/85 mmHg  Ht 5\' 3"  (1.6 m)  Wt 144 lb (65.318 kg)  BMI 25.51 kg/m2  Encounter Diagnoses  Name Primary?  . Left wrist fracture, with routine healing, subsequent encounter Yes  . Surgical aftercare, musculoskeletal system     Second postop visit staples are removed wound looks clean. X-rays show stable fracture  Recommend splint with a Velcro splint. Come back in 4-6 weeks for repeat x-ray.

## 2015-01-01 ENCOUNTER — Other Ambulatory Visit: Payer: Self-pay | Admitting: *Deleted

## 2015-01-01 ENCOUNTER — Other Ambulatory Visit: Payer: Self-pay | Admitting: Cardiology

## 2015-01-01 MED ORDER — HYDROCODONE-ACETAMINOPHEN 5-325 MG PO TABS: 1.0000 | ORAL_TABLET | Freq: Four times a day (QID) | ORAL | Status: DC | PRN

## 2015-01-07 ENCOUNTER — Telehealth: Payer: Self-pay | Admitting: Orthopedic Surgery

## 2015-01-07 NOTE — Telephone Encounter (Signed)
Patient picked up Rx

## 2015-01-20 ENCOUNTER — Telehealth: Payer: Self-pay | Admitting: Cardiology

## 2015-01-20 NOTE — Telephone Encounter (Signed)
The patient called wanting to know if you had Eliquis 5 mg samples available to her.  Please call her back at [phone].

## 2015-01-28 ENCOUNTER — Encounter: Payer: Self-pay | Admitting: Orthopedic Surgery

## 2015-01-28 ENCOUNTER — Ambulatory Visit (INDEPENDENT_AMBULATORY_CARE_PROVIDER_SITE_OTHER): Payer: PPO | Admitting: Orthopedic Surgery

## 2015-01-28 ENCOUNTER — Ambulatory Visit (INDEPENDENT_AMBULATORY_CARE_PROVIDER_SITE_OTHER): Payer: PPO

## 2015-01-28 VITALS — BP 145/94 | Ht 63.0 in | Wt 144.0 lb

## 2015-01-28 DIAGNOSIS — S62102D Fracture of unspecified carpal bone, left wrist, subsequent encounter for fracture with routine healing: Secondary | ICD-10-CM

## 2015-01-28 NOTE — Patient Instructions (Signed)
Ok to remove brace  

## 2015-01-28 NOTE — Progress Notes (Signed)
Patient ID: Marcia Woods, female   DOB: September 25, 1925, 81 y.o.   MRN: [id number]  Follow up visit  Chief Complaint  Patient presents with  . Follow-up    6 week follow up left wrist + xray, DOS 12/04/14    BP 145/94 mmHg  Ht 5\' 3"  (1.6 m)  Wt 144 lb (65.318 kg)  BMI 25.51 kg/m2  Encounter Diagnosis  Name Primary?  . Left wrist fracture, with routine healing, subsequent encounter Yes    The patient is now 8 weeks after open treatment internal fixation of the left distal radius she has no complaints were done to remove her brace today  Her x-ray show that the fracture is consolidating well implants are in good position  Her wrist looks good her incision looks good she has some stiffness in her flexor tendons which we can address with further physical therapy and more aggressive home exercises  Follow-up 4 weeks

## 2015-02-20 ENCOUNTER — Other Ambulatory Visit: Payer: Self-pay | Admitting: Obstetrics and Gynecology

## 2015-02-25 ENCOUNTER — Encounter: Payer: Self-pay | Admitting: Orthopedic Surgery

## 2015-02-25 ENCOUNTER — Ambulatory Visit (INDEPENDENT_AMBULATORY_CARE_PROVIDER_SITE_OTHER): Payer: PPO

## 2015-02-25 ENCOUNTER — Ambulatory Visit (INDEPENDENT_AMBULATORY_CARE_PROVIDER_SITE_OTHER): Payer: PPO | Admitting: Orthopedic Surgery

## 2015-02-25 ENCOUNTER — Ambulatory Visit: Payer: PPO

## 2015-02-25 VITALS — BP 127/82 | Ht 63.0 in | Wt 144.0 lb

## 2015-02-25 DIAGNOSIS — M25561 Pain in right knee: Secondary | ICD-10-CM

## 2015-02-25 DIAGNOSIS — S62102D Fracture of unspecified carpal bone, left wrist, subsequent encounter for fracture with routine healing: Secondary | ICD-10-CM

## 2015-02-25 NOTE — Patient Instructions (Signed)
Joint Injection  Care After  Refer to this sheet in the next few days. These instructions provide you with information on caring for yourself after you have had a joint injection. Your caregiver also may give you more specific instructions. Your treatment has been planned according to current medical practices, but problems sometimes occur. Call your caregiver if you have any problems or questions after your procedure.  After any type of joint injection, it is not uncommon to experience:  · Soreness, swelling, or bruising around the injection site.  · Mild numbness, tingling, or weakness around the injection site caused by the numbing medicine used before or with the injection.  It also is possible to experience the following effects associated with the specific agent after injection:  · Iodine-based contrast agents:  ¨ Allergic reaction (itching, hives, widespread redness, and swelling beyond the injection site).  · Corticosteroids (These effects are rare.):  ¨ Allergic reaction.  ¨ Increased blood sugar levels (If you have diabetes and you notice that your blood sugar levels have increased, notify your caregiver).  ¨ Increased blood pressure levels.  ¨ Mood swings.  · Hyaluronic acid in the use of viscosupplementation.  ¨ Temporary heat or redness.  ¨ Temporary rash and itching.  ¨ Increased fluid accumulation in the injected joint.  These effects all should resolve within a day after your procedure.   HOME CARE INSTRUCTIONS  · Limit yourself to light activity the day of your procedure. Avoid lifting heavy objects, bending, stooping, or twisting.  · Take prescription or over-the-counter pain medication as directed by your caregiver.  · You may apply ice to your injection site to reduce pain and swelling the day of your procedure. Ice may be applied 03-04 times:  ¨ Put ice in a plastic bag.  ¨ Place a towel between your skin and the bag.  ¨ Leave the ice on for no longer than 15-20 minutes each time.  SEEK  IMMEDIATE MEDICAL CARE IF:   · Pain and swelling get worse rather than better or extend beyond the injection site.  · Numbness does not go away.  · Blood or fluid continues to leak from the injection site.  · You have chest pain.  · You have swelling of your face or tongue.  · You have trouble breathing or you become dizzy.  · You develop a fever, chills, or severe tenderness at the injection site that last longer than 1 day.  MAKE SURE YOU:  · Understand these instructions.  · Watch your condition.  · Get help right away if you are not doing well or if you get worse.  Document Released: 02/18/2011 Document Revised: 08/30/2011 Document Reviewed: 02/18/2011  Exit[employer]® Patient Information ©2015 [employer]. This information is not intended to replace advice given to you by your health care provider. Make sure you discuss any questions you have with your health care provider.

## 2015-02-25 NOTE — Progress Notes (Signed)
Patient ID: Marcia Woods, female   DOB: Apr 25, 1926, 81 y.o.   MRN: [id number]  Follow up visit  Chief Complaint  Patient presents with  . Follow-up    4 WEEK FOLLOW UP + XRAY LEFT WRIST FRACTURE, DOS 12/04/14    BP 127/82 mmHg  Ht 5\' 3"  (1.6 m)  Wt 144 lb (65.318 kg)  BMI 25.51 kg/m2  Encounter Diagnosis  Name Primary?  . Left wrist fracture, with routine healing, subsequent encounter Yes    Left wrist fracture back in June open treatment internal fixation. X-rays today show fracture healing  Range of motion has returned to normal weakness of grip is noted  New complaint within the postoperative period patient complains of right knee pain request right knee injection  Right knee injection Procedure note right knee injection verbal consent was obtained to inject right knee joint  Timeout was completed to confirm the site of injection  The medications used were 40 mg of Depo-Medrol and 1% lidocaine 3 cc  Anesthesia was provided by ethyl chloride and the skin was prepped with alcohol.  After cleaning the skin with alcohol a 20-gauge needle was used to inject the right knee joint. There were no complications. A sterile bandage was applied.

## 2015-03-10 ENCOUNTER — Ambulatory Visit (INDEPENDENT_AMBULATORY_CARE_PROVIDER_SITE_OTHER): Payer: PPO | Admitting: Orthopedic Surgery

## 2015-03-10 ENCOUNTER — Telehealth: Payer: Self-pay | Admitting: Orthopedic Surgery

## 2015-03-10 VITALS — BP 148/93 | Ht 63.0 in | Wt 144.0 lb

## 2015-03-10 DIAGNOSIS — L989 Disorder of the skin and subcutaneous tissue, unspecified: Secondary | ICD-10-CM

## 2015-03-10 MED ORDER — CEPHALEXIN 500 MG PO CAPS: 500.0000 mg | ORAL_CAPSULE | Freq: Two times a day (BID) | ORAL | Status: DC

## 2015-03-10 NOTE — Telephone Encounter (Signed)
Patient needs to be seen in office.

## 2015-03-10 NOTE — Patient Instructions (Signed)
Fill antibiotic only if wound opens

## 2015-03-10 NOTE — Progress Notes (Signed)
Patient ID: Marcia Woods, female   DOB: May 02, 1926, 81 y.o.   MRN: [id number]  Follow up visit  Chief Complaint  Patient presents with  . Follow-up    Recheck on left wrist for wound, redness and drainage. DOI 12-05-14.    BP 148/93 mmHg  Ht 5\' 3"  (1.6 m)  Wt 144 lb (65.318 kg)  BMI 25.51 kg/m2  Encounter Diagnosis  Name Primary?  . Skin lesion of left arm Yes    Still in the postop period had a volar approach for a ORIF of the distal radius was released back on September 6. Presents with a papule over the ulnar side of the wound without erythema or tenderness or increase in pain  Recommend covering the area with a Band-Aid and if the area opens up she can start Keflex but otherwise just keep it covered and observation follow up 3 weeks

## 2015-03-10 NOTE — Telephone Encounter (Signed)
Patient called to request to speak with nurse or to Dr Aline Brochure; relayed that they are in clinic, and received message that her wrist wound from fracture care has some redness and drainage. Offered appointment for today. Trying to get transportation

## 2015-03-10 NOTE — Telephone Encounter (Signed)
Update: 03/10/15 - patient called back; was able to get transportation; confirmed appointment for today.

## 2015-03-31 ENCOUNTER — Ambulatory Visit (INDEPENDENT_AMBULATORY_CARE_PROVIDER_SITE_OTHER): Payer: PPO | Admitting: Orthopedic Surgery

## 2015-03-31 VITALS — BP 115/65 | Ht 63.0 in | Wt 144.0 lb

## 2015-03-31 DIAGNOSIS — L989 Disorder of the skin and subcutaneous tissue, unspecified: Secondary | ICD-10-CM

## 2015-03-31 NOTE — Progress Notes (Signed)
Patient ID: Marcia Woods, female   DOB: [birth year], 81 y.o.   MRN: [id number]  Follow up visit  Chief Complaint  Patient presents with  . Follow-up    3 week recheck on left wrist on wound, DOI 12-05-14.    BP 115/65 mmHg  Ht 5\' 3"  (1.6 m)  Wt 144 lb (65.318 kg)  BMI 25.51 kg/m2  Encounter Diagnosis  Name Primary?  . Skin lesion of left arm Yes    The skin lesion over the arm where she had the surgery is without any complications. The surgical incision healed very well just to the radial side of it. Appears to be some kind of papule. It appears benign  I told her to call me if any problems but it looks normal

## 2015-05-05 ENCOUNTER — Telehealth: Payer: Self-pay | Admitting: Cardiology

## 2015-05-05 NOTE — Telephone Encounter (Signed)
Pt is unable to afford her Eliquis, she had to pay $140 today when they delivered her medicine

## 2015-05-06 NOTE — Telephone Encounter (Signed)
Pt states that she cannot afford her Eliquis, as it was $40 per month and now it is $113 per month and she is unable to pay. She want's to know if there are any other options for her other than coumadin( which she states she does not want) I gave the PT 2 boxes of samples. She has an appointment tomorrow morning @ 11:20.

## 2015-05-06 NOTE — Telephone Encounter (Signed)
Agree with providing samples. We can discuss it further when I see her in the office.

## 2015-05-07 ENCOUNTER — Encounter: Payer: Self-pay | Admitting: Cardiology

## 2015-05-07 ENCOUNTER — Ambulatory Visit (INDEPENDENT_AMBULATORY_CARE_PROVIDER_SITE_OTHER): Payer: PPO | Admitting: Cardiology

## 2015-05-07 VITALS — BP 128/78 | HR 79 | Ht 62.0 in | Wt 143.8 lb

## 2015-05-07 DIAGNOSIS — Z23 Encounter for immunization: Secondary | ICD-10-CM

## 2015-05-07 DIAGNOSIS — I480 Paroxysmal atrial fibrillation: Secondary | ICD-10-CM

## 2015-05-07 DIAGNOSIS — E782 Mixed hyperlipidemia: Secondary | ICD-10-CM

## 2015-05-07 NOTE — Patient Instructions (Signed)
Medication Instructions:  Your physician recommends that you continue on your current medications as directed. Please refer to the Current Medication list given to you today.   Labwork: NONE  Testing/Procedures: NONE  Follow-Up: Your physician wants you to follow-up in: 6 MONTHS WITH DR. MCDOWELL. You will receive a reminder letter in the mail two months in advance. If you don't receive a letter, please call our office to schedule the follow-up appointment.   Any Other Special Instructions Will Be Listed Below (If Applicable).       If you need a refill on your cardiac medications before your next appointment, please call your pharmacy.  Thanks for choosing [facility]!!!     

## 2015-05-07 NOTE — Progress Notes (Signed)
Cardiology Office Note  Date: 05/07/2015   ID: Marcia Woods, DOB 1926-02-10, MRN [id number]  PCP: [name], MD  Primary Cardiologist: Rozann Lesches, MD   Chief Complaint  Patient presents with  . Atrial Fibrillation    History of Present Illness: Marcia Woods is an 82 y.o. female last seen in May. She is here today for a follow-up visit. Reports chronic but stable shortness of breath, no significant palpitations or chest pain.  We reviewed her medications which are outlined below. She has had some recent concerns about the cost of Eliquis, currently in the doughnut hole. We did give her samples. She does not report any bleeding problems, and I reviewed her recent lab work from Dr. Willey Blade.   Past Medical History  Diagnosis Date  . Hyperlipemia   . GERD (gastroesophageal reflux disease)   . Macular degeneration   . Constipation   . Anxiety   . Arthritis   . Vaginal atrophy   . PSVT (paroxysmal supraventricular tachycardia) ([place]) 03/05/2013  . Ankle fracture 05/22/2013  . Atrial fibrillation ([place])   . Vaginal odor 01/29/2014  . Nodule of left lung     left upper lobe  . PONV (postoperative nausea and vomiting)     Current Outpatient Prescriptions  Medication Sig Dispense Refill  . ALPRAZolam (XANAX) 0.5 MG tablet Take 0.25-0.5 mg by mouth at bedtime. For sleep    . apixaban (ELIQUIS) 5 MG TABS tablet Take 1 tablet (5 mg total) by mouth 2 (two) times daily. 60 tablet 6  . cephALEXin (KEFLEX) 500 MG capsule Take 1 capsule (500 mg total) by mouth 2 (two) times daily. 28 capsule 0  . cholecalciferol (VITAMIN D) 1000 UNITS tablet Take 1,000 Units by mouth daily.     . clobetasol cream (TEMOVATE) 0.05 % APPLY TO AFFECTED AREA OF VULVA ONCE OR TWICE A DAY. 30 g 1  . diltiazem (CARDIZEM CD) 240 MG 24 hr capsule TAKE (1) CAPSULE BY MOUTH ONCE DAILY. 90 capsule 3  . docusate sodium (COLACE) 100 MG capsule Take 100 mg by mouth daily as needed for mild constipation.    .  furosemide (LASIX) 20 MG tablet Take 1 tablet (20 mg total) by mouth daily. (Patient taking differently: Take 20 mg by mouth daily as needed for fluid or edema. ) 90 tablet 3  . ketotifen (THERA TEARS ALLERGY) 0.025 % ophthalmic solution Place 1 drop into both eyes daily as needed (for irritation).     . metoprolol (LOPRESSOR) 50 MG tablet TAKE (1) TABLET BY MOUTH TWICE DAILY. 180 tablet 2  . pantoprazole (PROTONIX) 40 MG tablet Take 40 mg by mouth every morning.     . pravastatin (PRAVACHOL) 40 MG tablet Take 40 mg by mouth at bedtime.      No current facility-administered medications for this visit.    Allergies:  Codeine   Social History: The patient  reports that she has never smoked. She has never used smokeless tobacco. She reports that she does not drink alcohol or use illicit drugs.   ROS:  Please see the history of present illness. Otherwise, complete review of systems is positive for chronic shortness of breath.  All other systems are reviewed and negative.   Physical Exam: VS:  BP 128/78 mmHg  Pulse 79  Ht 5\' 2"  (1.575 m)  Wt 143 lb 12.8 oz (65.227 kg)  BMI 26.29 kg/m2  SpO2 99%, BMI Body mass index is 26.29 kg/(m^2).  Wt Readings from Last 3  Encounters:  05/07/15 143 lb 12.8 oz (65.227 kg)  03/31/15 144 lb (65.318 kg)  03/10/15 144 lb (65.318 kg)     General: Patient appears comfortable at rest. HEENT: Conjunctiva and lids normal, oropharynx clear with moist mucosa. Neck: Supple, no elevated JVP or carotid bruits, no thyromegaly. Lungs: Clear to auscultation, nonlabored breathing at rest. Cardiac: Regular rate and rhythm, no S3 or significant systolic murmur, no pericardial rub. Abdomen: Soft, nontender, no hepatomegaly, bowel sounds present, no guarding or rebound. Extremities: No pitting edema, distal pulses 2+.  ECG: ECG is not ordered today.  Recent Labwork:  November 2016: BUN 10, creatinine 0.7, potassium 4.3, AST 35, ALT 27, hemoglobin 14.0, platelets 211,  cholesterol 193, triglycerides 63, HDL 89, LDL 91  Other Studies Reviewed Today:  Echocardiogram 03/02/2013: Study Conclusions  - Left ventricle: The cavity size was normal. There was mild focal basal hypertrophy with no obstruction. Systolic function was normal. The estimated ejection fraction was in the range of 60% to 65%. Wall motion was normal; there were no regional wall motion abnormalities. The study is not technically sufficient to allow evaluation of LV diastolic function. There is evidence of elevated left atrial pressure. - Aortic valve: Valve area: 1.71cm^2(VTI). - Left atrium: The atrium was mildly dilated.  ASSESSMENT AND PLAN:  1. Paroxysmal to persistent atrial fibrillation. Continue current regimen including Cardizem CD, Lopressor, and Eliquis. She was provided with samples of Eliquis today.  2. Hyperlipidemia, on Pravachol. Recent LDL 91.  Current medicines were reviewed at length with the patient today.  Disposition: FU with me in 6 months.   Signed, Satira Sark, MD, [facility] 05/07/2015 11:48 AM    [facility]. [address], [place], [place] [address] Phone: [phone]; Fax: [phone]

## 2015-05-13 ENCOUNTER — Other Ambulatory Visit (HOSPITAL_COMMUNITY): Payer: Self-pay | Admitting: Internal Medicine

## 2015-05-13 DIAGNOSIS — Z780 Asymptomatic menopausal state: Secondary | ICD-10-CM

## 2015-05-20 ENCOUNTER — Ambulatory Visit (HOSPITAL_COMMUNITY)
Admission: RE | Admit: 2015-05-20 | Discharge: 2015-05-20 | Disposition: A | Discharge status: Home / Self Care | Payer: PPO | Source: Ambulatory Visit | Attending: Internal Medicine | Admitting: Internal Medicine

## 2015-05-20 DIAGNOSIS — M8580 Other specified disorders of bone density and structure, unspecified site: Secondary | ICD-10-CM | POA: Diagnosis not present

## 2015-05-20 DIAGNOSIS — Z780 Asymptomatic menopausal state: Secondary | ICD-10-CM | POA: Diagnosis not present

## 2015-06-11 ENCOUNTER — Telehealth: Payer: Self-pay | Admitting: *Deleted

## 2015-06-11 NOTE — Telephone Encounter (Signed)
Oncology Nurse Navigator Documentation  Oncology Nurse Navigator Flowsheets 06/11/2015  Navigator Encounter Type Telephone/I received a message from Dr. Tammi Klippel.  He would like me to follow up with patient to see if she would like CT scan.  I called and left a vm message to call with my name and phone number  Patient Visit Type Initial  Treatment Phase Other  Interventions Coordination of Care  Time Spent with Patient 15

## 2015-06-13 ENCOUNTER — Telehealth: Payer: Self-pay | Admitting: *Deleted

## 2015-06-13 NOTE — Telephone Encounter (Signed)
Oncology Nurse Navigator Documentation  Oncology Nurse Navigator Flowsheets 06/13/2015  Navigator Encounter Type Telephone/patient called.  She would like, "one more" CT Chest. I updated Dr. Tammi Klippel for further instructions   Patient Visit Type Follow-up  Treatment Phase -  Interventions Coordination of Care  Time Spent with Patient 15

## 2015-06-17 ENCOUNTER — Telehealth: Payer: Self-pay | Admitting: Radiation Oncology

## 2015-06-17 ENCOUNTER — Other Ambulatory Visit: Payer: Self-pay | Admitting: Radiation Oncology

## 2015-06-17 DIAGNOSIS — R911 Solitary pulmonary nodule: Secondary | ICD-10-CM

## 2015-06-17 NOTE — Telephone Encounter (Signed)
Opened in error

## 2015-06-18 ENCOUNTER — Telehealth: Payer: Self-pay | Admitting: *Deleted

## 2015-06-18 MED ORDER — HYDROCODONE-ACETAMINOPHEN 5-325 MG PO TABS: 1.0000 | ORAL_TABLET | Freq: Four times a day (QID) | ORAL | Status: DC | PRN

## 2015-06-18 NOTE — Telephone Encounter (Signed)
REQUESTING REFILL ON HYDROCODONE, STATES SHE IS STILL HAVING HAND PAIN AND WILL BE MAKING FOLLOW UP APPT AFTER THE FIRST OF THE YEAR

## 2015-06-18 NOTE — Telephone Encounter (Signed)
Called patient to inform of CT and fu, spoke with patient and she is aware of these appts.

## 2015-06-18 NOTE — Telephone Encounter (Signed)
Prescription available,called patient, no answer, left vm 

## 2015-06-24 ENCOUNTER — Ambulatory Visit (HOSPITAL_COMMUNITY): Payer: PPO

## 2015-06-26 DIAGNOSIS — G453 Amaurosis fugax: Secondary | ICD-10-CM | POA: Diagnosis not present

## 2015-06-26 DIAGNOSIS — H353134 Nonexudative age-related macular degeneration, bilateral, advanced atrophic with subfoveal involvement: Secondary | ICD-10-CM | POA: Diagnosis not present

## 2015-06-26 DIAGNOSIS — H353222 Exudative age-related macular degeneration, left eye, with inactive choroidal neovascularization: Secondary | ICD-10-CM | POA: Diagnosis not present

## 2015-06-26 DIAGNOSIS — H35363 Drusen (degenerative) of macula, bilateral: Secondary | ICD-10-CM | POA: Diagnosis not present

## 2015-06-27 ENCOUNTER — Other Ambulatory Visit (HOSPITAL_COMMUNITY): Payer: Self-pay | Admitting: Internal Medicine

## 2015-06-27 ENCOUNTER — Ambulatory Visit: Payer: PPO | Admitting: Radiation Oncology

## 2015-06-27 ENCOUNTER — Inpatient Hospital Stay: Admission: RE | Admit: 2015-06-27 | Payer: PPO | Source: Ambulatory Visit | Admitting: Radiation Oncology

## 2015-06-27 DIAGNOSIS — G453 Amaurosis fugax: Secondary | ICD-10-CM

## 2015-07-01 ENCOUNTER — Telehealth: Payer: Self-pay | Admitting: *Deleted

## 2015-07-01 NOTE — Telephone Encounter (Signed)
Called patient to ask about rescheduling scan and fu, patient declined at this time and she said that she would call back when she can.

## 2015-07-02 ENCOUNTER — Ambulatory Visit (HOSPITAL_COMMUNITY)
Admission: RE | Admit: 2015-07-02 | Discharge: 2015-07-02 | Disposition: A | Discharge status: Home / Self Care | Payer: PPO | Source: Ambulatory Visit | Attending: Internal Medicine | Admitting: Internal Medicine

## 2015-07-02 DIAGNOSIS — I6523 Occlusion and stenosis of bilateral carotid arteries: Secondary | ICD-10-CM | POA: Diagnosis not present

## 2015-07-02 DIAGNOSIS — G453 Amaurosis fugax: Secondary | ICD-10-CM | POA: Diagnosis not present

## 2015-07-02 DIAGNOSIS — E785 Hyperlipidemia, unspecified: Secondary | ICD-10-CM | POA: Insufficient documentation

## 2015-07-02 DIAGNOSIS — I2510 Atherosclerotic heart disease of native coronary artery without angina pectoris: Secondary | ICD-10-CM | POA: Diagnosis not present

## 2015-07-03 DIAGNOSIS — Z6827 Body mass index (BMI) 27.0-27.9, adult: Secondary | ICD-10-CM | POA: Diagnosis not present

## 2015-07-03 DIAGNOSIS — G453 Amaurosis fugax: Secondary | ICD-10-CM | POA: Diagnosis not present

## 2015-07-08 ENCOUNTER — Telehealth: Payer: Self-pay | Admitting: Radiation Oncology

## 2015-07-08 NOTE — Telephone Encounter (Signed)
Attempting to reach patient for clarification about cancelled CT scan and follow up with Dr. Tammi Klippel. Unable to reach patient by phone. Phoned patient's daughter in law, Marcia Woods. Marcia Woods explains that the patient does not wish to pursue any more CT scans or follow up with Dr. Tammi Klippel in the future because "the tumor hasn't grown any in all this time." Marcia Woods denies that the patient is symptomatic with cough, SOB, chest pain, night sweats or weight loss. Also, Marcia Woods confirms the patient follow up with her PCP regularly and her heart doctor every 3-4 months. Marcia Woods confirms the patient is of sound mind to make such decisions. Expressed appreciation for Marcia Woods speaking to me today. Will inform Dr. Tammi Klippel of these findings. Marcia Woods understands should future needs arise for the patient to reach out to our office.

## 2015-07-17 IMAGING — RF DG C-ARM 61-120 MIN
1 series · 2 of 2 positions shown · IV contrast (agent unspecified)
Comparison: November 27, 2014.

CLINICAL DATA: Left distal radial fracture.

EXAM:
DG C-ARM 61-120 MIN; LEFT WRIST - 2 VIEW
TECHNIQUE: Two intraoperative fluoroscopic images of the left wrist were
obtained.
CONTRAST:  None.
FLUOROSCOPY TIME:  Radiation Exposure Index (as provided by the
fluoroscopic device): 1.15 mGy

[Series 1: run · 2 of 2 slices shown]
[im 1/2]
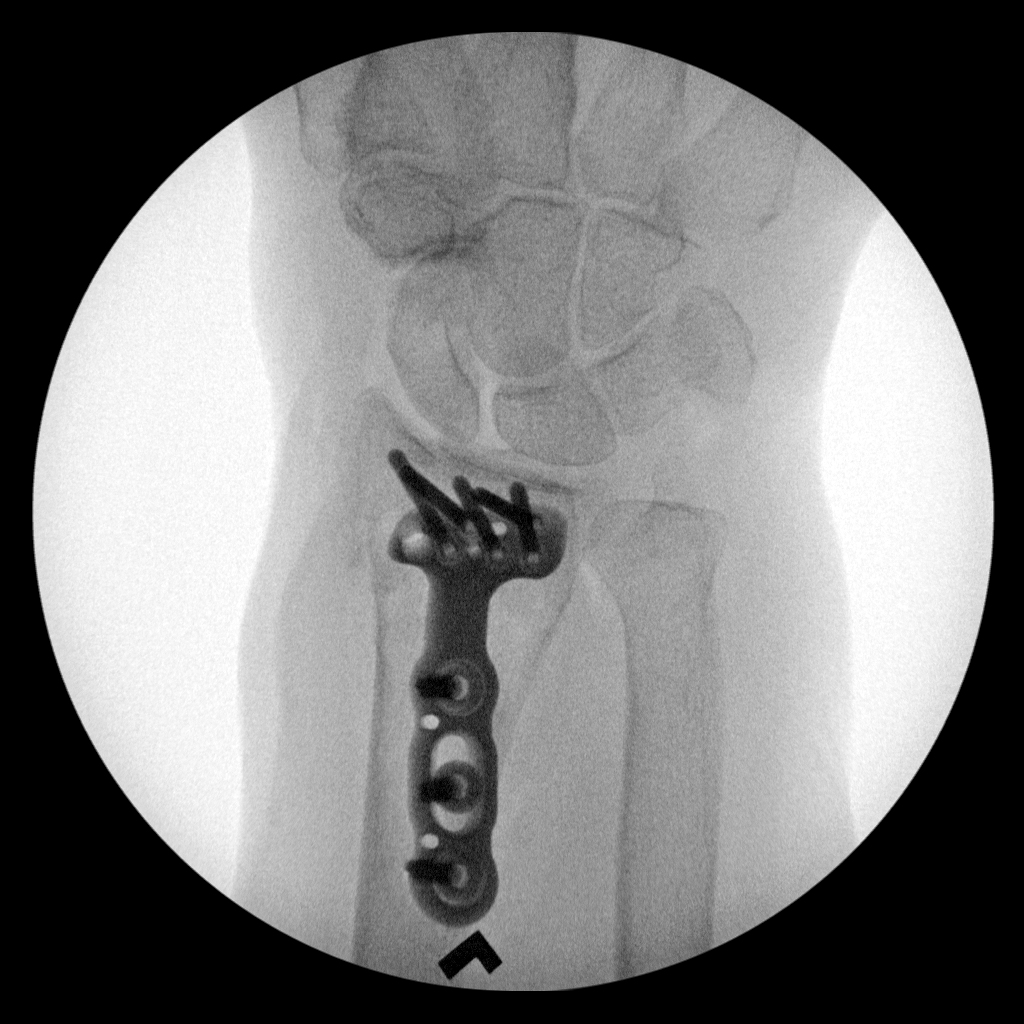
[im 2/2]
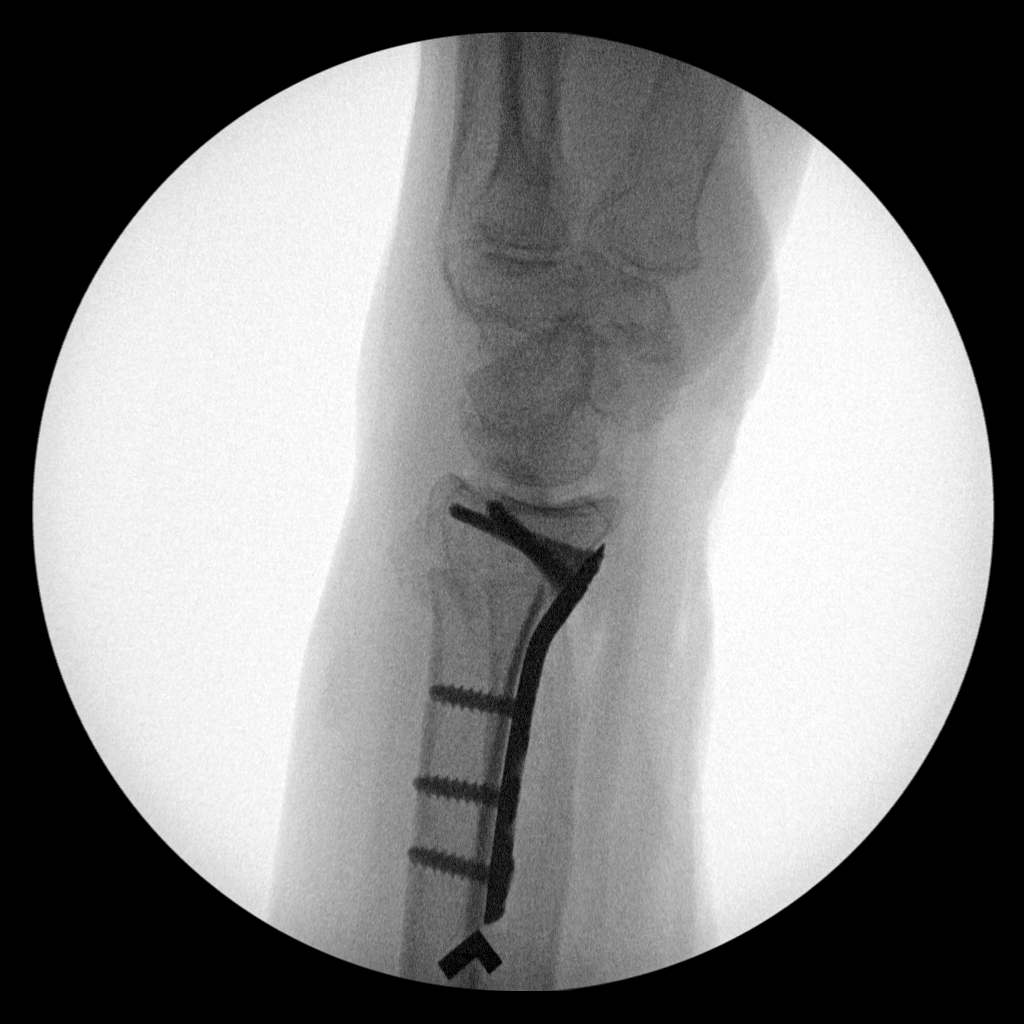

[2 of 2 positions shown; findings below may reference images not displayed]

FINDINGS: These images demonstrate the patient be status post internal
fixation of distal radial fracture with good alignment of fracture
components.
IMPRESSION: Status post surgical internal fixation of distal left radial
fracture.

## 2015-08-21 ENCOUNTER — Other Ambulatory Visit: Payer: Self-pay | Admitting: Cardiology

## 2015-09-09 ENCOUNTER — Ambulatory Visit (INDEPENDENT_AMBULATORY_CARE_PROVIDER_SITE_OTHER): Payer: PPO | Admitting: Orthopedic Surgery

## 2015-09-09 VITALS — BP 156/96 | HR 80 | Ht 62.0 in | Wt 145.8 lb

## 2015-09-09 DIAGNOSIS — M25561 Pain in right knee: Secondary | ICD-10-CM | POA: Diagnosis not present

## 2015-09-09 DIAGNOSIS — M170 Bilateral primary osteoarthritis of knee: Secondary | ICD-10-CM | POA: Diagnosis not present

## 2015-09-09 DIAGNOSIS — M25562 Pain in left knee: Secondary | ICD-10-CM | POA: Diagnosis not present

## 2015-09-09 MED ORDER — TRAMADOL-ACETAMINOPHEN 37.5-325 MG PO TABS: 1.0000 | ORAL_TABLET | Freq: Four times a day (QID) | ORAL | Status: DC

## 2015-09-09 NOTE — Progress Notes (Addendum)
Patient ID: Marcia Woods, female   DOB: 09-24-1925, 82 y.o.   MRN: [id number]  Chief Complaint  Patient presents with  . Knee Pain    Bilateral knee pain    HPI  History of bilateral knee pain history of arthritis of the knee. Also had a left wrist open treatment internal fixation in June complains of some stiffness in the hand  Both knees are hurting no back pain.  No swelling no catching locking or giving way  Review of Systems  Constitutional: Negative for fever, chills, weight loss and malaise/fatigue.  Musculoskeletal: Negative for back pain.  Neurological: Negative for tingling.   Past Medical History  Diagnosis Date  . Hyperlipemia   . GERD (gastroesophageal reflux disease)   . Macular degeneration   . Constipation   . Anxiety   . Arthritis   . Vaginal atrophy   . PSVT (paroxysmal supraventricular tachycardia) ([place]) 03/05/2013  . Ankle fracture 05/22/2013  . Atrial fibrillation ([place])   . Vaginal odor 01/29/2014  . Nodule of left lung     left upper lobe  . PONV (postoperative nausea and vomiting)     BP 156/96 mmHg  Pulse 80  Ht 5\' 2"  (1.575 m)  Wt 145 lb 12.8 oz (66.134 kg)  BMI 26.66 kg/m2  Physical Exam  Constitutional: She is oriented to person, place, and time. She appears well-developed and well-nourished. No distress.  Cardiovascular: Normal rate and intact distal pulses.   Musculoskeletal:       Right knee: She exhibits no effusion.       Left knee: She exhibits no effusion.  Neurological: She is alert and oriented to person, place, and time. She has normal reflexes. She exhibits normal muscle tone. Coordination normal.  Skin: Skin is warm and dry. No rash noted. She is not diaphoretic. No erythema. No pallor.  Psychiatric: She has a normal mood and affect. Her behavior is normal. Judgment and thought content normal.    Right Knee Exam   Tenderness  The patient is experiencing tenderness in the medial joint line and lateral joint line.  Range  of Motion  Right knee flexion: 120.   Tests  Drawer:       Anterior - negative    Posterior - negative Varus: negative Valgus: negative  Other  Erythema: present Scars: absent Sensation: normal Pulse: present Swelling: mild Other tests: no effusion present   Left Knee Exam   Tenderness  The patient is experiencing tenderness in the medial joint line and lateral joint line.  Range of Motion  Left knee flexion: 125.   Tests  Drawer:       Anterior - negative     Posterior - negative Varus: negative Valgus: negative  Other  Sensation: normal Pulse: present Swelling: mild Effusion: no effusion present        ASSESSMENT AND PLAN   Pain bilateral knees Primary osteoarthritis both knees   Procedure note left knee injection verbal consent was obtained to inject left knee joint  Timeout was completed to confirm the site of injection  The medications used were 40 mg of Depo-Medrol and 1% lidocaine 3 cc  Anesthesia was provided by ethyl chloride and the skin was prepped with alcohol.  After cleaning the skin with alcohol a 20-gauge needle was used to inject the left knee joint. There were no complications. A sterile bandage was applied.   Procedure note right knee injection verbal consent was obtained to inject right knee joint  Timeout  was completed to confirm the site of injection  The medications used were 40 mg of Depo-Medrol and 1% lidocaine 3 cc  Anesthesia was provided by ethyl chloride and the skin was prepped with alcohol.  After cleaning the skin with alcohol a 20-gauge needle was used to inject the right knee joint. There were no complications. A sterile bandage was applied.

## 2015-09-09 NOTE — Patient Instructions (Signed)

## 2015-09-09 NOTE — Addendum Note (Signed)
Addended by: Arther Abbott E on: 09/09/2015 12:03 PM   Modules accepted: Miquel Dunn

## 2015-09-25 ENCOUNTER — Other Ambulatory Visit: Payer: Self-pay | Admitting: Cardiology

## 2015-10-07 ENCOUNTER — Ambulatory Visit: Payer: PPO | Admitting: Orthopedic Surgery

## 2015-10-09 DIAGNOSIS — H353222 Exudative age-related macular degeneration, left eye, with inactive choroidal neovascularization: Secondary | ICD-10-CM | POA: Diagnosis not present

## 2015-10-09 DIAGNOSIS — H353134 Nonexudative age-related macular degeneration, bilateral, advanced atrophic with subfoveal involvement: Secondary | ICD-10-CM | POA: Diagnosis not present

## 2015-10-09 DIAGNOSIS — H35363 Drusen (degenerative) of macula, bilateral: Secondary | ICD-10-CM | POA: Diagnosis not present

## 2015-10-09 DIAGNOSIS — G453 Amaurosis fugax: Secondary | ICD-10-CM | POA: Diagnosis not present

## 2015-10-27 ENCOUNTER — Ambulatory Visit (INDEPENDENT_AMBULATORY_CARE_PROVIDER_SITE_OTHER): Payer: PPO | Admitting: Cardiology

## 2015-10-27 ENCOUNTER — Other Ambulatory Visit: Payer: Self-pay | Admitting: Cardiology

## 2015-10-27 ENCOUNTER — Encounter: Payer: Self-pay | Admitting: Cardiology

## 2015-10-27 VITALS — BP 122/66 | HR 83 | Ht 64.0 in | Wt 145.0 lb

## 2015-10-27 DIAGNOSIS — I471 Supraventricular tachycardia: Secondary | ICD-10-CM

## 2015-10-27 DIAGNOSIS — I4891 Unspecified atrial fibrillation: Secondary | ICD-10-CM | POA: Diagnosis not present

## 2015-10-27 DIAGNOSIS — I4819 Other persistent atrial fibrillation: Secondary | ICD-10-CM

## 2015-10-27 DIAGNOSIS — I481 Persistent atrial fibrillation: Secondary | ICD-10-CM

## 2015-10-27 NOTE — Progress Notes (Signed)
Cardiology Office Note  Date: 10/27/2015   ID: DEL GIM, DOB September 27, 1925, MRN [id number]  PCP: [name], MD  Primary Cardiologist: Rozann Lesches, MD   Chief Complaint  Patient presents with  . Atrial Fibrillation    History of Present Illness: Marcia Woods is an 82 y.o. female last seen in November 2016. She presents for a routine follow-up visit. Reports chronic dyspnea on exertion, no definite palpitations. Weight has been relatively stable, already 1 pound difference compared to last visit. Still feels abdominal bloating sometimes and uses Lasix as needed.  She continues on Eliquis without bleeding problems. Most recent lab work is outlined below from November of last year. We discussed obtaining a follow-up CBC and BMET.  I reviewed her ECG from today which showed atrial fibrillation at 84 bpm with low voltage and decreased R wave progression, nonspecific T-wave changes.  Past Medical History  Diagnosis Date  . Hyperlipemia   . GERD (gastroesophageal reflux disease)   . Macular degeneration   . Constipation   . Anxiety   . Arthritis   . Vaginal atrophy   . PSVT (paroxysmal supraventricular tachycardia) ([place]) 03/05/2013  . Ankle fracture 05/22/2013  . Atrial fibrillation ([place])   . Vaginal odor 01/29/2014  . Nodule of left lung     left upper lobe  . PONV (postoperative nausea and vomiting)     Current Outpatient Prescriptions  Medication Sig Dispense Refill  . ALPRAZolam (XANAX) 0.5 MG tablet Take 0.25-0.5 mg by mouth at bedtime. For sleep    . apixaban (ELIQUIS) 5 MG TABS tablet Take 1 tablet (5 mg total) by mouth 2 (two) times daily. 60 tablet 6  . cholecalciferol (VITAMIN D) 1000 UNITS tablet Take 1,000 Units by mouth daily.     . clobetasol cream (TEMOVATE) 0.05 % APPLY TO AFFECTED AREA OF VULVA ONCE OR TWICE A DAY. 30 g 1  . diltiazem (CARDIZEM CD) 240 MG 24 hr capsule TAKE (1) CAPSULE BY MOUTH ONCE DAILY. 90 capsule 3  . docusate sodium (COLACE) 100  MG capsule Take 100 mg by mouth daily as needed for mild constipation.    . furosemide (LASIX) 20 MG tablet TAKE ONE TABLET BY MOUTH ONCE DAILY. 90 tablet 3  . HYDROcodone-acetaminophen (NORCO/VICODIN) 5-325 MG tablet Take 1 tablet by mouth every 6 (six) hours as needed for moderate pain. 60 tablet 0  . ketotifen (THERA TEARS ALLERGY) 0.025 % ophthalmic solution Place 1 drop into both eyes daily as needed (for irritation).     . metoprolol (LOPRESSOR) 50 MG tablet TAKE (1) TABLET BY MOUTH TWICE DAILY. 180 tablet 3  . pantoprazole (PROTONIX) 40 MG tablet Take 40 mg by mouth every morning.     . pravastatin (PRAVACHOL) 40 MG tablet Take 40 mg by mouth at bedtime.     . traMADol-acetaminophen (ULTRACET) 37.5-325 MG tablet Take 1 tablet by mouth every 6 (six) hours. 90 tablet 0   No current facility-administered medications for this visit.   Allergies:  Codeine   Social History: The patient  reports that she has never smoked. She has never used smokeless tobacco. She reports that she does not drink alcohol or use illicit drugs.   ROS:  Please see the history of present illness. Otherwise, complete review of systems is positive for trouble with balance at times, no recent falls.  All other systems are reviewed and negative.   Physical Exam: VS:  BP 122/66 mmHg  Pulse 83  Ht 5\' 4"  (1.626  m)  Wt 145 lb (65.772 kg)  BMI 24.88 kg/m2  SpO2 99%, BMI Body mass index is 24.88 kg/(m^2).  Wt Readings from Last 3 Encounters:  10/27/15 145 lb (65.772 kg)  09/09/15 145 lb 12.8 oz (66.134 kg)  05/07/15 143 lb 12.8 oz (65.227 kg)    General: Elderly woman, appears comfortable at rest. HEENT: Conjunctiva and lids normal, oropharynx clear with moist mucosa. Neck: Supple, no elevated JVP or carotid bruits, no thyromegaly. Lungs: Clear to auscultation, nonlabored breathing at rest. Cardiac: Irregularly irregular, no S3 or significant systolic murmur, no pericardial rub. Abdomen: Soft, nontender, no  hepatomegaly, bowel sounds present, no guarding or rebound. Extremities: No pitting edema, distal pulses 2+.  ECG: I personally reviewed the prior tracing from 12/02/2014 which showed rate-controlled atrial fibrillation with nonspecific ST-T changes..  Recent Labwork: 12/02/2014: BUN 8; Creatinine, Ser 0.73; Hemoglobin 14.1; Platelets 207; Potassium 4.3; Sodium 139  November 2016: BUN 10, creatinine 0.8, potassium 4.3, AST 35, ALT 27, hemoglobin 14.0, platelets 211, cholesterol 193, triglycerides 63, HDL 89, LDL 91  Other Studies Reviewed Today:  Echocardiogram 03/02/2013: Study Conclusions  - Left ventricle: The cavity size was normal. There was mild focal basal hypertrophy with no obstruction. Systolic function was normal. The estimated ejection fraction was in the range of 60% to 65%. Wall motion was normal; there were no regional wall motion abnormalities. The study is not technically sufficient to allow evaluation of LV diastolic function. There is evidence of elevated left atrial pressure. - Aortic valve: Valve area: 1.71cm^2(VTI). - Left atrium: The atrium was mildly dilated.  Assessment and Plan:  1. Persistent atrial fibrillation. Continue strategy of heart rate control and anticoagulation. She is on Eliquis, obtain follow-up CBC and BMET.  2. History of PSVT, no obvious recurrences.  Current medicines were reviewed with the patient today.   Orders Placed This Encounter  Procedures  . CBC  . Basic Metabolic Panel (BMET)  . EKG 12-Lead    Disposition: FU with me in 6 months.   Signed, Satira Sark, MD, [facility] 10/27/2015 10:24 AM    [facility] [place]. [address], [place], [place] [address] Phone: [phone]; Fax: [phone]

## 2015-10-27 NOTE — Patient Instructions (Signed)
Your physician wants you to follow-up in: 6 months Dr Ferne Reus will receive a reminder letter in the mail two months in advance. If you don't receive a letter, please call our office to schedule the follow-up appointment.   Your physician recommends that you continue on your current medications as directed. Please refer to the Current Medication list given to you today.   Get lab work now:  CBC,BMET     Thank you for choosing [facility] !

## 2015-10-28 LAB — BASIC METABOLIC PANEL
BUN / CREAT RATIO: 14 (ref 12–28)
BUN: 11 mg/dL (ref 8–27)
CO2: 28 mmol/L (ref 18–29)
CREATININE: 0.8 mg/dL (ref 0.57–1.00)
Calcium: 9.9 mg/dL (ref 8.7–10.3)
Chloride: 102 mmol/L (ref 96–106)
GFR calc Af Amer: 77 mL/min/{1.73_m2} (ref >59)
GFR calc non Af Amer: 67 mL/min/{1.73_m2} (ref >59)
Glucose: 71 mg/dL (ref 65–99)
Potassium: 5.5 mmol/L — ABNORMAL HIGH (ref 3.5–5.2)
Sodium: 143 mmol/L (ref 134–144)

## 2015-10-28 LAB — CBC
HEMOGLOBIN: 13.8 g/dL (ref 11.1–15.9)
Hematocrit: 41.9 % (ref 34.0–46.6)
MCH: 30 pg (ref 26.6–33.0)
MCHC: 32.9 g/dL (ref 31.5–35.7)
MCV: 91 fL (ref 79–97)
Platelets: 197 10*3/uL (ref 150–379)
RBC: 4.6 x10E6/uL (ref 3.77–5.28)
RDW: 14.2 % (ref 12.3–15.4)
WBC: 4.5 10*3/uL (ref 3.4–10.8)

## 2015-11-18 ENCOUNTER — Encounter: Payer: Self-pay | Admitting: Adult Health

## 2015-11-18 ENCOUNTER — Ambulatory Visit (INDEPENDENT_AMBULATORY_CARE_PROVIDER_SITE_OTHER): Payer: PPO | Admitting: Adult Health

## 2015-11-18 VITALS — BP 130/84 | HR 66 | Ht 64.0 in | Wt 144.0 lb

## 2015-11-18 DIAGNOSIS — L309 Dermatitis, unspecified: Secondary | ICD-10-CM | POA: Diagnosis not present

## 2015-11-18 HISTORY — DX: Dermatitis, unspecified: L30.9

## 2015-11-18 MED ORDER — CLOBETASOL PROP EMOLLIENT BASE 0.05 % EX CREA: TOPICAL_CREAM | CUTANEOUS | Status: DC

## 2015-11-18 NOTE — Progress Notes (Signed)
Subjective:     Patient ID: Marcia Woods, female   DOB: Apr 05, 1926, 82 y.o.   MRN: [id number]  HPI Marcia Woods is a 82 year old white female in complaining of discoloration of lower legs, noticed since starting eliquis.Has seen dermatologist and told to elevate 45 minutes every day.Has no pain or itching or burning.She requests refill on temovate for vulva itch, only uses as needed.  Review of Systems +discoloration lower legs, no pain,itching or burning Reviewed past medical,surgical, social and family history. Reviewed medications and allergies.     Objective:   Physical Exam BP 130/84 mmHg  Pulse 66  Ht 5\' 4"  (1.626 m)  Wt 144 lb (65.318 kg)  BMI 24.71 kg/m2   Skin warm and dry, has brownish red discoloration lower legs and some petechiae, has good pedal pulses, and temperature, some mild swelling non pitting.  Assessment:     Statis dermatitis of bilateral lower legs    Plan:     Elevate feet and legs several times daily Use cool compresses Try cetapil cream bid Try compression hose or ace wrap Follow up prn Refilled temovate x1

## 2015-11-18 NOTE — Patient Instructions (Signed)
Elevate feet Try cetaphil Try TED hose or ace wrap Follow up prn

## 2015-12-17 ENCOUNTER — Ambulatory Visit (INDEPENDENT_AMBULATORY_CARE_PROVIDER_SITE_OTHER): Payer: PPO

## 2015-12-17 ENCOUNTER — Encounter: Payer: Self-pay | Admitting: Orthopedic Surgery

## 2015-12-17 ENCOUNTER — Ambulatory Visit (INDEPENDENT_AMBULATORY_CARE_PROVIDER_SITE_OTHER): Payer: PPO | Admitting: Orthopedic Surgery

## 2015-12-17 VITALS — BP 142/87 | Ht 64.0 in | Wt 145.0 lb

## 2015-12-17 DIAGNOSIS — S93401A Sprain of unspecified ligament of right ankle, initial encounter: Secondary | ICD-10-CM

## 2015-12-17 DIAGNOSIS — M25561 Pain in right knee: Secondary | ICD-10-CM

## 2015-12-17 DIAGNOSIS — S8001XA Contusion of right knee, initial encounter: Secondary | ICD-10-CM

## 2015-12-17 DIAGNOSIS — M25571 Pain in right ankle and joints of right foot: Secondary | ICD-10-CM | POA: Diagnosis not present

## 2015-12-17 DIAGNOSIS — S8002XA Contusion of left knee, initial encounter: Secondary | ICD-10-CM

## 2015-12-17 NOTE — Patient Instructions (Addendum)
You have received an injection of steroids into the joint. 15% of patients will have increased pain within the 24 hours postinjection.   This is transient and will go away.   We recommend that you use ice packs on the injection site for 20 minutes every 2 hours and extra strength Tylenol 2 tablets every 8 as needed until the pain resolves.  If you continue to have pain after taking the Tylenol and using the ice please call the office for further instructions.  Ankle Sprain An ankle sprain is an injury to the strong, fibrous tissues (ligaments) that hold the bones of your ankle joint together.  CAUSES An ankle sprain is usually caused by a fall or by twisting your ankle. Ankle sprains most commonly occur when you step on the outer edge of your foot, and your ankle turns inward. People who participate in sports are more prone to these types of injuries.  SYMPTOMS   Pain in your ankle. The pain may be present at rest or only when you are trying to stand or walk.  Swelling.  Bruising. Bruising may develop immediately or within 1 to 2 days after your injury.  Difficulty standing or walking, particularly when turning corners or changing directions. DIAGNOSIS  Your caregiver will ask you details about your injury and perform a physical exam of your ankle to determine if you have an ankle sprain. During the physical exam, your caregiver will press on and apply pressure to specific areas of your foot and ankle. Your caregiver will try to move your ankle in certain ways. An X-ray exam may be done to be sure a bone was not broken or a ligament did not separate from one of the bones in your ankle (avulsion fracture).  TREATMENT  Certain types of braces can help stabilize your ankle. Your caregiver can make a recommendation for this. Your caregiver may recommend the use of medicine for pain. If your sprain is severe, your caregiver may refer you to a surgeon who helps to restore function to parts of your  skeletal system (orthopedist) or a physical therapist. [place] ice to your injury for 1-2 days or as directed by your caregiver. Applying ice helps to reduce inflammation and pain.  Put ice in a plastic bag.  Place a towel between your skin and the bag.  Leave the ice on for 15-20 minutes at a time, every 2 hours while you are awake.  Only take over-the-counter or prescription medicines for pain, discomfort, or fever as directed by your caregiver.  Elevate your injured ankle above the level of your heart as much as possible for 2-3 days.  If your caregiver recommends crutches, use them as instructed. Gradually put weight on the affected ankle. Continue to use crutches or a cane until you can walk without feeling pain in your ankle.  If you have a plaster splint, wear the splint as directed by your caregiver. Do not rest it on anything harder than a pillow for the first 24 hours. Do not put weight on it. Do not get it wet. You may take it off to take a shower or bath.  You may have been given an elastic bandage to wear around your ankle to provide support. If the elastic bandage is too tight (you have numbness or tingling in your foot or your foot becomes cold and blue), adjust the bandage to make it comfortable.  If you have an air splint, you may blow  more air into it or let air out to make it more comfortable. You may take your splint off at night and before taking a shower or bath. Wiggle your toes in the splint several times per day to decrease swelling. SEEK MEDICAL CARE IF:   You have rapidly increasing bruising or swelling.  Your toes feel extremely cold or you lose feeling in your foot.  Your pain is not relieved with medicine. SEEK IMMEDIATE MEDICAL CARE IF:  Your toes are numb or blue.  You have severe pain that is increasing. MAKE SURE YOU:   Understand these instructions.  Will watch your condition.  Will get help right away if you are not  doing well or get worse.   This information is not intended to replace advice given to you by your health care provider. Make sure you discuss any questions you have with your health care provider.   Document Released: 06/07/2005 Document Revised: 06/28/2014 Document Reviewed: 06/19/2011 Elsevier Interactive Patient Education [employer].

## 2015-12-17 NOTE — Progress Notes (Signed)
Chief Complaint  Patient presents with  . Knee Pain    RIGHT KNEE PAIN S/P FALL DOI 12/02/15  . Ankle Pain    RIGHT ANKLE PAIN S/P FALL DOI 12/02/15   HPI multiple complaints. 82 year old female followed in this office for years presents with acute pain in the right knee right ankle and left knee after falling about 3 weeks ago. She was in [place] didn't want to go to the ER. Call the office Instructions Ice Elevation and Rest. Presents with Lateral Ankle Pain. Lateral Right Knee Pain and Anterior left knee pain.  Quality of pain described as dull ache. Severity mild was severe initially area duration 3-4 weeks. Timing constant. Context improving  Review of Systems  Constitutional: Negative for fever and chills.  Musculoskeletal: Positive for myalgias, joint pain and falls.  Neurological: Negative for tingling and sensory change.    Past Medical History  Diagnosis Date  . Hyperlipemia   . GERD (gastroesophageal reflux disease)   . Macular degeneration   . Constipation   . Anxiety   . Arthritis   . Vaginal atrophy   . PSVT (paroxysmal supraventricular tachycardia) ([place]) 03/05/2013  . Ankle fracture 05/22/2013  . Atrial fibrillation ([place])   . Vaginal odor 01/29/2014  . Nodule of left lung     left upper lobe  . PONV (postoperative nausea and vomiting)   . Dermatitis of lower extremity 11/18/2015    Past Surgical History  Procedure Laterality Date  . Knee arthroscopy  2010    left-[facility]  . Trigger finger release      right  . Hemorrhoid surgery    . Cataract surgery      bilaterally  . Abdominal hysterectomy    . Colonoscopy  04/30/2011    Procedure: COLONOSCOPY;  Surgeon: Rogene Houston, MD;  [facility];  Service: Endoscopy;  Laterality: N/A;  8:30 am  . Esophagogastroduodenoscopy  05/27/2011    Procedure: ESOPHAGOGASTRODUODENOSCOPY (EGD);  Surgeon: Rogene Houston, MD;  [facility];  Service: Endoscopy;  Laterality: N/A;  300  . Cervical  disc surgery    . Cholecystectomy  01/26/2012    Procedure: LAPAROSCOPIC CHOLECYSTECTOMY;  Surgeon: Jamesetta So, MD;  Location: [facility];  Service: General;  Laterality: N/A;  . Video bronchoscopy with endobronchial navigation N/A 06/11/2013    Procedure: VIDEO BRONCHOSCOPY WITH ENDOBRONCHIAL NAVIGATION;  Surgeon: Grace Isaac, MD;  Location: [facility];  Service: Thoracic;  Laterality: N/A;  . Endobronchial ultrasound N/A 06/11/2013    Procedure: ENDOBRONCHIAL ULTRASOUND;  Surgeon: Grace Isaac, MD;  Location: [facility];  Service: Thoracic;  Laterality: N/A;  . Bronchial biopsy N/A 06/11/2013    Procedure: TRANSBRONCHIAL BIOPSIES;  Surgeon: Grace Isaac, MD;  Location: [facility];  Service: Thoracic;  Laterality: N/A;  . Orif wrist fracture Left 12/04/2014    Procedure: OPEN REDUCTION INTERNAL FIXATION LEFT WRIST FRACTURE;  Surgeon: Carole Civil, MD;  Location: [facility];  Service: Orthopedics;  Laterality: Left;   Family History  Problem Relation Age of Onset  . Colon cancer Neg Hx   . Heart disease Mother   . Heart disease Father   . Cancer Son     prostate  . Cancer Son     throat   Social History  Substance Use Topics  . Smoking status: Never Smoker   . Smokeless tobacco: Never Used  . Alcohol Use: No    Current outpatient prescriptions:  .  ALPRAZolam (XANAX) 0.5  MG tablet, Take 0.25-0.5 mg by mouth at bedtime. For sleep, Disp: , Rfl:  .  apixaban (ELIQUIS) 5 MG TABS tablet, Take 1 tablet (5 mg total) by mouth 2 (two) times daily., Disp: 60 tablet, Rfl: 6 .  cholecalciferol (VITAMIN D) 1000 UNITS tablet, Take 1,000 Units by mouth daily. , Disp: , Rfl:  .  Clobetasol Prop Emollient Base 0.05 % emollient cream, Use bid prn to affected area, Disp: 30 g, Rfl: 0 .  diltiazem (CARDIZEM CD) 240 MG 24 hr capsule, TAKE (1) CAPSULE BY MOUTH ONCE DAILY., Disp: 90 capsule, Rfl: 3 .  furosemide (LASIX) 20 MG tablet, TAKE ONE TABLET BY MOUTH ONCE DAILY., Disp: 90 tablet, Rfl: 3 .   metoprolol (LOPRESSOR) 50 MG tablet, TAKE (1) TABLET BY MOUTH TWICE DAILY., Disp: 180 tablet, Rfl: 3 .  pantoprazole (PROTONIX) 40 MG tablet, Take 40 mg by mouth every morning. , Disp: , Rfl:  .  pravastatin (PRAVACHOL) 40 MG tablet, Take 40 mg by mouth at bedtime. , Disp: , Rfl:   BP 142/87 mmHg  Ht 5\' 4"  (1.626 m)  Wt 145 lb (65.772 kg)  BMI 24.88 kg/m2  Physical Exam  Constitutional: She is oriented to person, place, and time. She appears well-developed and well-nourished. No distress.  Cardiovascular: Normal rate and intact distal pulses.   Musculoskeletal:  Gait pattern is unchanged from the slow cadence and short stride length and decreased flexion arc in both right and left knee  Neurological: She is alert and oriented to person, place, and time. She has normal reflexes. She exhibits normal muscle tone. Coordination normal.  Skin: Skin is warm and dry. No rash noted. She is not diaphoretic. No erythema. No pallor.  Psychiatric: She has a normal mood and affect. Her behavior is normal. Judgment and thought content normal.    Ortho Exam  Right knee lateral joint line tenderness small effusion knee flexion 125 collateral ligament stable cruciate ligament stable muscle tone normal skin normal.  Knee anterior knee pain tenderness no swelling full range of motion crucial ligament stable collateral ligaments stable strength normal.  Right ankle anterolateral tenderness foot nontender anterior drawer test normal painful inversion week E version swelling. ASSESSMENT: My personal interpretation of the images:  First x-ray right knee lateral compartment arthritis  Right ankle no arthritis soft tissue swelling no fracture  Encounter Diagnoses  Name Primary?  . Right ankle pain Yes  . Right knee pain   . Knee contusion, right, initial encounter   . Ankle sprain, right, initial encounter   . Knee contusion, left, initial encounter        PLAN Inject both knees  Returned to  normal activity follow-up as needed  Procedure note left knee injection verbal consent was obtained to inject left knee joint  Timeout was completed to confirm the site of injection  The medications used were 40 mg of Depo-Medrol and 1% lidocaine 3 cc  Anesthesia was provided by ethyl chloride and the skin was prepped with alcohol.  After cleaning the skin with alcohol a 20-gauge needle was used to inject the left knee joint. There were no complications. A sterile bandage was applied.   Procedure note left knee injection verbal consent was obtained to inject left knee joint  Timeout was completed to confirm the site of injection  The medications used were 40 mg of Depo-Medrol and 1% lidocaine 3 cc  Anesthesia was provided by ethyl chloride and the skin was prepped with alcohol.  After cleaning  the skin with alcohol a 20-gauge needle was used to inject the left knee joint. There were no complications. A sterile bandage was applied.     Arther Abbott, MD 12/17/2015 5:19 PM

## 2015-12-29 DIAGNOSIS — H353212 Exudative age-related macular degeneration, right eye, with inactive choroidal neovascularization: Secondary | ICD-10-CM | POA: Diagnosis not present

## 2015-12-29 DIAGNOSIS — H35363 Drusen (degenerative) of macula, bilateral: Secondary | ICD-10-CM | POA: Diagnosis not present

## 2015-12-29 DIAGNOSIS — H3562 Retinal hemorrhage, left eye: Secondary | ICD-10-CM | POA: Diagnosis not present

## 2015-12-29 DIAGNOSIS — H353134 Nonexudative age-related macular degeneration, bilateral, advanced atrophic with subfoveal involvement: Secondary | ICD-10-CM | POA: Diagnosis not present

## 2015-12-30 DIAGNOSIS — H353221 Exudative age-related macular degeneration, left eye, with active choroidal neovascularization: Secondary | ICD-10-CM | POA: Diagnosis not present

## 2016-01-15 ENCOUNTER — Telehealth: Payer: Self-pay | Admitting: Cardiology

## 2016-01-15 NOTE — Telephone Encounter (Signed)
2 week supply eliquis 5 mg given to pt  Lot [id number] exp 5/19 pt will also bring in completed paperwork for pt assistance program

## 2016-01-29 DIAGNOSIS — H353221 Exudative age-related macular degeneration, left eye, with active choroidal neovascularization: Secondary | ICD-10-CM | POA: Diagnosis not present

## 2016-02-03 ENCOUNTER — Telehealth: Payer: Self-pay

## 2016-02-03 NOTE — Telephone Encounter (Signed)
Md signed paperwork,faxed to [employer] pt assistance for help with Eliquis,copy of record in file bin

## 2016-02-05 ENCOUNTER — Other Ambulatory Visit: Payer: Self-pay | Admitting: Cardiology

## 2016-02-06 ENCOUNTER — Telehealth: Payer: Self-pay | Admitting: Cardiology

## 2016-02-06 NOTE — Telephone Encounter (Signed)
Pt assistance approved free Eliquis thru 06/20/16,to be shipped next week to office,lm for pt

## 2016-03-04 DIAGNOSIS — H353221 Exudative age-related macular degeneration, left eye, with active choroidal neovascularization: Secondary | ICD-10-CM | POA: Diagnosis not present

## 2016-03-04 DIAGNOSIS — H353134 Nonexudative age-related macular degeneration, bilateral, advanced atrophic with subfoveal involvement: Secondary | ICD-10-CM | POA: Diagnosis not present

## 2016-03-22 ENCOUNTER — Ambulatory Visit (INDEPENDENT_AMBULATORY_CARE_PROVIDER_SITE_OTHER): Payer: PPO

## 2016-03-22 ENCOUNTER — Ambulatory Visit (INDEPENDENT_AMBULATORY_CARE_PROVIDER_SITE_OTHER): Payer: PPO | Admitting: Orthopedic Surgery

## 2016-03-22 ENCOUNTER — Encounter: Payer: Self-pay | Admitting: Orthopedic Surgery

## 2016-03-22 VITALS — BP 140/91 | HR 72 | Wt 140.0 lb

## 2016-03-22 DIAGNOSIS — M25561 Pain in right knee: Secondary | ICD-10-CM

## 2016-03-22 DIAGNOSIS — M170 Bilateral primary osteoarthritis of knee: Secondary | ICD-10-CM | POA: Diagnosis not present

## 2016-03-22 DIAGNOSIS — M25562 Pain in left knee: Secondary | ICD-10-CM

## 2016-03-22 DIAGNOSIS — G8929 Other chronic pain: Secondary | ICD-10-CM | POA: Diagnosis not present

## 2016-03-22 NOTE — Patient Instructions (Signed)

## 2016-03-22 NOTE — Progress Notes (Signed)
Patient ID: Marcia Woods, female   DOB: 11-08-1925, 82 y.o.   MRN: [id number]  Chief Complaint  Patient presents with  . Knee Pain    BILATERAL KNEE PAIN    HPI Marcia Woods is a 82 y.o. female.   HPI  Bilateral knee pain left and right. We x-rayed the left knee today.  She has a history of osteoarthritis in the right knee now also complaining of pain in the left knee  She's taking Tylenol for pain  Duration several weeks now quality aching severity moderate duration as stated timing exacerbated by exercise.  Minimal mechanical symptoms  Review of Systems Review of Systems  Constitutional: Negative for unexpected weight change.  Neurological: Negative for numbness.   /pmh Past Surgical History:  Procedure Laterality Date  . ABDOMINAL HYSTERECTOMY    . BRONCHIAL BIOPSY N/A 06/11/2013   Procedure: TRANSBRONCHIAL BIOPSIES;  Surgeon: Grace Isaac, MD;  Location: [facility];  Service: Thoracic;  Laterality: N/A;  . Cataract surgery     bilaterally  . CERVICAL DISC SURGERY    . CHOLECYSTECTOMY  01/26/2012   Procedure: LAPAROSCOPIC CHOLECYSTECTOMY;  Surgeon: Jamesetta So, MD;  [facility];  Service: General;  Laterality: N/A;  . COLONOSCOPY  04/30/2011   Procedure: COLONOSCOPY;  Surgeon: Rogene Houston, MD;  [facility];  Service: Endoscopy;  Laterality: N/A;  8:30 am  . ENDOBRONCHIAL ULTRASOUND N/A 06/11/2013   Procedure: ENDOBRONCHIAL ULTRASOUND;  Surgeon: Grace Isaac, MD;  Location: [facility];  Service: Thoracic;  Laterality: N/A;  . ESOPHAGOGASTRODUODENOSCOPY  05/27/2011   Procedure: ESOPHAGOGASTRODUODENOSCOPY (EGD);  Surgeon: Rogene Houston, MD;  [facility];  Service: Endoscopy;  Laterality: N/A;  300  . HEMORRHOID SURGERY    . KNEE ARTHROSCOPY  2010   left-[facility]  . ORIF WRIST FRACTURE Left 12/04/2014   Procedure: OPEN REDUCTION INTERNAL FIXATION LEFT WRIST FRACTURE;  Surgeon: Carole Civil, MD;  [facility];  Service:  Orthopedics;  Laterality: Left;  . TRIGGER FINGER RELEASE     right  . VIDEO BRONCHOSCOPY WITH ENDOBRONCHIAL NAVIGATION N/A 06/11/2013   Procedure: VIDEO BRONCHOSCOPY WITH ENDOBRONCHIAL NAVIGATION;  Surgeon: Grace Isaac, MD;  [facility];  Service: Thoracic;  Laterality: N/A;     Physical Exam BP (!) 140/91   Pulse 72   Wt 140 lb (63.5 kg)   BMI 24.03 kg/m    Physical Exam  Right knee flexion 120 left knee 115 right knee lateral joint line tenderness left knee lateral joint line tenderness both knees have effusion. Both knees are stable to varus valgus stress testing and AP stress testing. Both knees have normal muscle tone in the quadriceps and normal extensor function with grade 5 strength. Both knees have normal skin area  Good pulses are noted distally in each leg with no edema in the right or left ankle: Normal sensation is noted in the right and left leg  X-ray left knee shows osteoarthritis with valgus alignment prior right knee x-ray shows the same  Patient has taken Tylenol and when given the options of medication surgical treatment she opted for injection  Both knees are injected   Procedure note left knee injection verbal consent was obtained to inject left knee joint  Timeout was completed to confirm the site of injection  The medications used were 40 mg of Depo-Medrol and 1% lidocaine 3 cc  Anesthesia was provided by ethyl chloride and the skin was prepped with alcohol.  After cleaning the skin with alcohol a 20-gauge needle was used to inject the left knee joint. There were no complications. A sterile bandage was applied.   Procedure note right knee injection verbal consent was obtained to inject right knee joint  Timeout was completed to confirm the site of injection  The medications used were 40 mg of Depo-Medrol and 1% lidocaine 3 cc  Anesthesia was provided by ethyl chloride and the skin was prepped with alcohol.  After cleaning the skin  with alcohol a 20-gauge needle was used to inject the right knee joint. There were no complications. A sterile bandage was applied.   Fu prn

## 2016-04-15 DIAGNOSIS — H353221 Exudative age-related macular degeneration, left eye, with active choroidal neovascularization: Secondary | ICD-10-CM | POA: Diagnosis not present

## 2016-05-05 NOTE — Progress Notes (Signed)
Cardiology Office Note  Date: 05/06/2016   ID: Marcia Woods, DOB July 08, 1925, MRN [id number]  PCP: [name], MD  Primary Cardiologist: Rozann Lesches, MD   Chief Complaint  Patient presents with  . Atrial Fibrillation    History of Present Illness: Marcia Woods is an 82 y.o. female last seen in May. She is here today with her daughter for a follow-up visit. Since last encounter, she reports no decline in stamina or chest pain. She does feel brief palpitations occasionally and describes chronic NYHA class II dyspnea with most activities. She did have a fall recently, did not lose consciousness. No major bleeding or injuries associated. She has occasional bruising on her arms.  I reviewed her medications which are outlined below. Cardiac regimen includes Cardizem CD, Lopressor, Pravachol, and Eliquis.  I reviewed her lab work from May. She will be seeing Dr. Willey Blade later this month for a physical.  Past Medical History:  Diagnosis Date  . Ankle fracture 05/22/2013  . Anxiety   . Arthritis   . Atrial fibrillation ([place])   . Constipation   . Dermatitis of lower extremity 11/18/2015  . GERD (gastroesophageal reflux disease)   . Hyperlipemia   . Macular degeneration   . Nodule of left lung    left upper lobe  . PONV (postoperative nausea and vomiting)   . PSVT (paroxysmal supraventricular tachycardia) ([place]) 03/05/2013  . Vaginal atrophy   . Vaginal odor 01/29/2014    Current Outpatient Prescriptions  Medication Sig Dispense Refill  . ALPRAZolam (XANAX) 0.5 MG tablet Take 0.25-0.5 mg by mouth at bedtime. For sleep    . apixaban (ELIQUIS) 5 MG TABS tablet Take 1 tablet (5 mg total) by mouth 2 (two) times daily. 60 tablet 6  . cholecalciferol (VITAMIN D) 1000 UNITS tablet Take 1,000 Units by mouth daily.     . Clobetasol Prop Emollient Base 0.05 % emollient cream Use bid prn to affected area 30 g 0  . diltiazem (CARDIZEM CD) 240 MG 24 hr capsule TAKE (1) CAPSULE BY MOUTH  ONCE DAILY. 90 capsule 3  . furosemide (LASIX) 20 MG tablet TAKE ONE TABLET BY MOUTH ONCE DAILY. 90 tablet 3  . metoprolol (LOPRESSOR) 50 MG tablet TAKE (1) TABLET BY MOUTH TWICE DAILY. 180 tablet 3  . pantoprazole (PROTONIX) 40 MG tablet Take 40 mg by mouth every morning.     . pravastatin (PRAVACHOL) 40 MG tablet Take 40 mg by mouth at bedtime.      No current facility-administered medications for this visit.    Allergies:  Codeine   Social History: The patient  reports that she has never smoked. She has never used smokeless tobacco. She reports that she does not drink alcohol or use drugs.   ROS:  Please see the history of present illness. Otherwise, complete review of systems is positive for none.  All other systems are reviewed and negative.   Physical Exam: VS:  BP 124/68   Pulse 83   Ht 5\' 5"  (1.651 m)   Wt 139 lb (63 kg)   SpO2 97%   BMI 23.13 kg/m , BMI Body mass index is 23.13 kg/m.  Wt Readings from Last 3 Encounters:  05/06/16 139 lb (63 kg)  03/22/16 140 lb (63.5 kg)  12/17/15 145 lb (65.8 kg)    General: Elderly woman, appears comfortable at rest. HEENT: Conjunctiva and lids normal, oropharynx clear with moist mucosa. Neck: Supple, no elevated JVP or carotid bruits, no thyromegaly. Lungs: Clear  to auscultation, nonlabored breathing at rest. Cardiac: Irregularly irregular, no S3 or significant systolic murmur, no pericardial rub. Abdomen: Soft, nontender, no hepatomegaly, bowel sounds present, no guarding or rebound. Extremities: No pitting edema, distal pulses 2+.  ECG: I personally reviewed the tracing from 10/27/2015 which showed rate-controlled atrial fibrillation with poor R-wave progression and nonspecific T-wave changes.  Recent Labwork: 10/27/2015: BUN 11; Creatinine, Ser 0.80; Platelets 197; Potassium 5.5; Sodium 143   Other Studies Reviewed Today:  Echocardiogram 03/02/2013: Study Conclusions  - Left ventricle: The cavity size was normal. There was  mild focal basal hypertrophy with no obstruction. Systolic function was normal. The estimated ejection fraction was in the range of 60% to 65%. Wall motion was normal; there were no regional wall motion abnormalities. The study is not technically sufficient to allow evaluation of LV diastolic function. There is evidence of elevated left atrial pressure. - Aortic valve: Valve area: 1.71cm^2(VTI). - Left atrium: The atrium was mildly dilated.  Assessment and Plan:  1. Chronic atrial fibrillation. Continue strategy of heart rate control and anticoagulation. She will be getting follow-up lab work later this month with Dr. Willey Blade.  2. Hyperlipidemia, continues on Pravachol.  Current medicines were reviewed with the patient today.  Disposition: Follow-up in 6 months.  Signed, Satira Sark, MD, [facility] 05/06/2016 1:26 PM    [facility] [address] [place], [place] [address] Phone: [phone]; Fax: [phone]

## 2016-05-06 ENCOUNTER — Ambulatory Visit (INDEPENDENT_AMBULATORY_CARE_PROVIDER_SITE_OTHER): Payer: PPO | Admitting: Cardiology

## 2016-05-06 ENCOUNTER — Encounter: Payer: Self-pay | Admitting: Cardiology

## 2016-05-06 VITALS — BP 124/68 | HR 83 | Ht 65.0 in | Wt 139.0 lb

## 2016-05-06 DIAGNOSIS — I482 Chronic atrial fibrillation, unspecified: Secondary | ICD-10-CM

## 2016-05-06 DIAGNOSIS — E782 Mixed hyperlipidemia: Secondary | ICD-10-CM

## 2016-05-06 NOTE — Patient Instructions (Signed)

## 2016-05-18 DIAGNOSIS — Z79899 Other long term (current) drug therapy: Secondary | ICD-10-CM | POA: Diagnosis not present

## 2016-05-18 DIAGNOSIS — K219 Gastro-esophageal reflux disease without esophagitis: Secondary | ICD-10-CM | POA: Diagnosis not present

## 2016-05-18 DIAGNOSIS — M1990 Unspecified osteoarthritis, unspecified site: Secondary | ICD-10-CM | POA: Diagnosis not present

## 2016-05-18 DIAGNOSIS — E785 Hyperlipidemia, unspecified: Secondary | ICD-10-CM | POA: Diagnosis not present

## 2016-05-27 ENCOUNTER — Telehealth: Payer: Self-pay

## 2016-05-27 DIAGNOSIS — K219 Gastro-esophageal reflux disease without esophagitis: Secondary | ICD-10-CM | POA: Diagnosis not present

## 2016-05-27 DIAGNOSIS — I482 Chronic atrial fibrillation: Secondary | ICD-10-CM | POA: Diagnosis not present

## 2016-05-27 DIAGNOSIS — Z23 Encounter for immunization: Secondary | ICD-10-CM | POA: Diagnosis not present

## 2016-05-27 DIAGNOSIS — E785 Hyperlipidemia, unspecified: Secondary | ICD-10-CM | POA: Diagnosis not present

## 2016-05-27 DIAGNOSIS — Z0001 Encounter for general adult medical examination with abnormal findings: Secondary | ICD-10-CM | POA: Diagnosis not present

## 2016-05-27 NOTE — Telephone Encounter (Signed)
LM asking pt to bring documentation for eliquis pt assistance program

## 2016-05-31 DIAGNOSIS — H353221 Exudative age-related macular degeneration, left eye, with active choroidal neovascularization: Secondary | ICD-10-CM | POA: Diagnosis not present

## 2016-06-29 ENCOUNTER — Encounter: Payer: Self-pay | Admitting: Cardiology

## 2016-06-29 ENCOUNTER — Ambulatory Visit (INDEPENDENT_AMBULATORY_CARE_PROVIDER_SITE_OTHER): Payer: PPO | Admitting: Cardiology

## 2016-06-29 VITALS — BP 144/80 | HR 97 | Ht 64.0 in | Wt 149.0 lb

## 2016-06-29 DIAGNOSIS — I482 Chronic atrial fibrillation, unspecified: Secondary | ICD-10-CM

## 2016-06-29 DIAGNOSIS — E782 Mixed hyperlipidemia: Secondary | ICD-10-CM | POA: Diagnosis not present

## 2016-06-29 MED ORDER — METOPROLOL TARTRATE 50 MG PO TABS: 75.0000 mg | ORAL_TABLET | Freq: Two times a day (BID) | ORAL | 3 refills | Status: DC

## 2016-06-29 NOTE — Progress Notes (Signed)
Cardiology Office Note  Date: 06/29/2016   ID: KEELIE LINDSETH, DOB 09/23/1925, MRN [id number]  PCP: [name], MD  Primary Cardiologist: Rozann Lesches, MD   Chief Complaint  Patient presents with  . Atrial Fibrillation    History of Present Illness: Marcia Woods is an 83 y.o. female last seen in November 2017. She called the office and was added to the schedule today. She states that she has been more aware of palpitations and a sense of mild tightness in her chest with increased heart rate over the last week or so. She states that she has been taking her medications regularly. She does not report any breathlessness or orthopnea.  I reviewed her ECG today which shows atrial fibrillation at 97 bpm with decreased R wave progression and low voltage, nonspecific ST-T changes.  We reviewed her medications which include Cardizem CD at 240 mg daily as well as Lopressor 50 mg twice daily.  Past Medical History:  Diagnosis Date  . Ankle fracture 05/22/2013  . Anxiety   . Arthritis   . Atrial fibrillation ([place])   . Constipation   . Dermatitis of lower extremity 11/18/2015  . GERD (gastroesophageal reflux disease)   . Hyperlipemia   . Macular degeneration   . Nodule of left lung    left upper lobe  . PONV (postoperative nausea and vomiting)   . PSVT (paroxysmal supraventricular tachycardia) ([place]) 03/05/2013  . Vaginal atrophy   . Vaginal odor 01/29/2014    Current Outpatient Prescriptions  Medication Sig Dispense Refill  . ALPRAZolam (XANAX) 0.5 MG tablet Take 0.25-0.5 mg by mouth at bedtime. For sleep    . apixaban (ELIQUIS) 5 MG TABS tablet Take 1 tablet (5 mg total) by mouth 2 (two) times daily. 60 tablet 6  . cholecalciferol (VITAMIN D) 1000 UNITS tablet Take 1,000 Units by mouth daily.     . Clobetasol Prop Emollient Base 0.05 % emollient cream Use bid prn to affected area 30 g 0  . diltiazem (CARDIZEM CD) 240 MG 24 hr capsule TAKE (1) CAPSULE BY MOUTH ONCE DAILY. 90  capsule 3  . furosemide (LASIX) 20 MG tablet TAKE ONE TABLET BY MOUTH ONCE DAILY. 90 tablet 3  . pantoprazole (PROTONIX) 40 MG tablet Take 40 mg by mouth every morning.     . pravastatin (PRAVACHOL) 40 MG tablet Take 40 mg by mouth at bedtime.     . metoprolol (LOPRESSOR) 50 MG tablet Take 1.5 tablets (75 mg total) by mouth 2 (two) times daily. 180 tablet 3   No current facility-administered medications for this visit.    Allergies:  Codeine   Social History: The patient  reports that she has never smoked. She has never used smokeless tobacco. She reports that she does not drink alcohol or use drugs.   ROS:  Please see the history of present illness. Otherwise, complete review of systems is positive for .  All other systems are reviewed and negative.   Physical Exam: VS:  BP (!) 144/80   Pulse 97   Ht 5\' 4"  (1.626 m)   Wt 149 lb (67.6 kg)   SpO2 98%   BMI 25.58 kg/m , BMI Body mass index is 25.58 kg/m.  Wt Readings from Last 3 Encounters:  06/29/16 149 lb (67.6 kg)  05/06/16 139 lb (63 kg)  03/22/16 140 lb (63.5 kg)    General: Elderly woman, no distress. HEENT: Conjunctiva and lids normal, oropharynx clear. Neck: Supple, no elevated JVP or carotid  bruits, no thyromegaly. Lungs: Clear to auscultation, nonlabored breathing at rest. Cardiac: Irregularly irregular, no S3 or significant systolic murmur, no pericardial rub. Abdomen: Soft, nontender, bowel sounds present, no guarding or rebound. Extremities: No pitting edema, distal pulses 2+. Skin: Warm and dry.  ECG: I personally reviewed the tracing from 10/27/2015 which showed rate-controlled atrial fibrillation with poor R-wave progression and nonspecific T-wave changes.  Recent Labwork: 10/27/2015: BUN 11; Creatinine, Ser 0.80; Platelets 197; Potassium 5.5; Sodium 143   Other Studies Reviewed Today:  Echocardiogram 03/02/2013: Study Conclusions  - Left ventricle: The cavity size was normal. There was mild focal basal  hypertrophy with no obstruction. Systolic function was normal. The estimated ejection fraction was in the range of 60% to 65%. Wall motion was normal; there were no regional wall motion abnormalities. The study is not technically sufficient to allow evaluation of LV diastolic function. There is evidence of elevated left atrial pressure. - Aortic valve: Valve area: 1.71cm^2(VTI). - Left atrium: The atrium was mildly dilated.  Assessment and Plan:  1. Chronic atrial fibrillation, heart rate up somewhat over baseline. We will increase Lopressor to 75 mg twice daily, otherwise continue her present regimen. Keep next scheduled visit.  2. Hyperlipidemia, she continues on Pravachol.  Current medicines were reviewed with the patient today.   Orders Placed This Encounter  Procedures  . EKG 12-Lead    Disposition: Keep next scheduled follow-up in 3 months.  Signed, Satira Sark, MD, [facility] 06/29/2016 11:48 AM    [facility]. [address], [place], [place] [address] Phone: [phone]; Fax: [phone]

## 2016-06-29 NOTE — Patient Instructions (Signed)
Your physician recommends that you schedule a follow-up appointment in: 3 months Dr Domenic Polite     INCREASE Lopressor to 75 mg ( 1 1/2 tablets) twice a day    If you need a refill on your cardiac medications before your next appointment, please call your pharmacy.    Thank you for choosing [facility] !

## 2016-08-02 DIAGNOSIS — H353221 Exudative age-related macular degeneration, left eye, with active choroidal neovascularization: Secondary | ICD-10-CM | POA: Diagnosis not present

## 2016-08-16 ENCOUNTER — Ambulatory Visit (INDEPENDENT_AMBULATORY_CARE_PROVIDER_SITE_OTHER): Payer: PPO | Admitting: Orthopedic Surgery

## 2016-08-16 DIAGNOSIS — M170 Bilateral primary osteoarthritis of knee: Secondary | ICD-10-CM

## 2016-08-16 DIAGNOSIS — M25562 Pain in left knee: Secondary | ICD-10-CM

## 2016-08-16 DIAGNOSIS — G8929 Other chronic pain: Secondary | ICD-10-CM | POA: Diagnosis not present

## 2016-08-16 DIAGNOSIS — M25561 Pain in right knee: Secondary | ICD-10-CM | POA: Diagnosis not present

## 2016-08-16 NOTE — Progress Notes (Signed)
Chief Complaint  Patient presents with  . Follow-up    Bilateral knees, wants injections.     Procedure note left knee injection verbal consent was obtained to inject left knee joint  Timeout was completed to confirm the site of injection  The medications used were 40 mg of Depo-Medrol and 1% lidocaine 3 cc  Anesthesia was provided by ethyl chloride and the skin was prepped with alcohol.  After cleaning the skin with alcohol a 20-gauge needle was used to inject the left knee joint. There were no complications. A sterile bandage was applied.   Procedure note right knee injection verbal consent was obtained to inject right knee joint  Timeout was completed to confirm the site of injection  The medications used were 40 mg of Depo-Medrol and 1% lidocaine 3 cc  Anesthesia was provided by ethyl chloride and the skin was prepped with alcohol.  After cleaning the skin with alcohol a 20-gauge needle was used to inject the right knee joint. There were no complications. A sterile bandage was applied.

## 2016-08-16 NOTE — Patient Instructions (Signed)

## 2016-09-20 DIAGNOSIS — H353221 Exudative age-related macular degeneration, left eye, with active choroidal neovascularization: Secondary | ICD-10-CM | POA: Diagnosis not present

## 2016-09-28 NOTE — Progress Notes (Signed)
Cardiology Office Note  Date: 09/29/2016   ID: Marcia Woods, DOB 12-May-1926, MRN [id number]  PCP: [name], MD  Primary Cardiologist: Rozann Lesches, MD   Chief Complaint  Patient presents with  . Atrial Fibrillation    History of Present Illness: Marcia Woods is an 83 y.o. female last seen in January. She is here today with her daughter for a follow-up visit. Reports no palpitations or chest pain, no clinical decline since last visit.  Lopressor dose was increased to 75 mg twice daily at the last visit. She also continues on Eliquis and Cardizem CD. She does not report any bleeding problems. She is due for follow-up lab work.  Blood pressure is elevated today. She reports compliance with her medications and no active symptoms. She continues to follow with Dr. Willey Blade, states that she will be seeing him later this year for a physical.  Past Medical History:  Diagnosis Date  . Ankle fracture 05/22/2013  . Anxiety   . Arthritis   . Atrial fibrillation ([place])   . Constipation   . Dermatitis of lower extremity 11/18/2015  . GERD (gastroesophageal reflux disease)   . Hyperlipemia   . Macular degeneration   . Nodule of left lung    left upper lobe  . PONV (postoperative nausea and vomiting)   . PSVT (paroxysmal supraventricular tachycardia) ([place]) 03/05/2013  . Vaginal atrophy   . Vaginal odor 01/29/2014    Past Surgical History:  Procedure Laterality Date  . ABDOMINAL HYSTERECTOMY    . BRONCHIAL BIOPSY N/A 06/11/2013   Procedure: TRANSBRONCHIAL BIOPSIES;  Surgeon: Grace Isaac, MD;  Location: [facility];  Service: Thoracic;  Laterality: N/A;  . Cataract surgery     bilaterally  . CERVICAL DISC SURGERY    . CHOLECYSTECTOMY  01/26/2012   Procedure: LAPAROSCOPIC CHOLECYSTECTOMY;  Surgeon: Jamesetta So, MD;  [facility];  Service: General;  Laterality: N/A;  . COLONOSCOPY  04/30/2011   Procedure: COLONOSCOPY;  Surgeon: Rogene Houston, MD;  [facility];   Service: Endoscopy;  Laterality: N/A;  8:30 am  . ENDOBRONCHIAL ULTRASOUND N/A 06/11/2013   Procedure: ENDOBRONCHIAL ULTRASOUND;  Surgeon: Grace Isaac, MD;  Location: [facility];  Service: Thoracic;  Laterality: N/A;  . ESOPHAGOGASTRODUODENOSCOPY  05/27/2011   Procedure: ESOPHAGOGASTRODUODENOSCOPY (EGD);  Surgeon: Rogene Houston, MD;  [facility];  Service: Endoscopy;  Laterality: N/A;  300  . HEMORRHOID SURGERY    . KNEE ARTHROSCOPY  2010   left-[facility]  . ORIF WRIST FRACTURE Left 12/04/2014   Procedure: OPEN REDUCTION INTERNAL FIXATION LEFT WRIST FRACTURE;  Surgeon: Carole Civil, MD;  [facility];  Service: Orthopedics;  Laterality: Left;  . TRIGGER FINGER RELEASE     right  . VIDEO BRONCHOSCOPY WITH ENDOBRONCHIAL NAVIGATION N/A 06/11/2013   Procedure: VIDEO BRONCHOSCOPY WITH ENDOBRONCHIAL NAVIGATION;  Surgeon: Grace Isaac, MD;  [facility];  Service: Thoracic;  Laterality: N/A;    Current Outpatient Prescriptions  Medication Sig Dispense Refill  . ALPRAZolam (XANAX) 0.5 MG tablet Take 0.25-0.5 mg by mouth at bedtime. For sleep    . apixaban (ELIQUIS) 5 MG TABS tablet Take 1 tablet (5 mg total) by mouth 2 (two) times daily. 60 tablet 6  . cholecalciferol (VITAMIN D) 1000 UNITS tablet Take 1,000 Units by mouth daily.     . Clobetasol Prop Emollient Base 0.05 % emollient cream Use bid prn to affected area 30 g 0  . diltiazem (CARDIZEM CD)  240 MG 24 hr capsule TAKE (1) CAPSULE BY MOUTH ONCE DAILY. 90 capsule 3  . furosemide (LASIX) 20 MG tablet TAKE ONE TABLET BY MOUTH ONCE DAILY. 90 tablet 3  . pantoprazole (PROTONIX) 40 MG tablet Take 40 mg by mouth every morning.     . pravastatin (PRAVACHOL) 40 MG tablet Take 40 mg by mouth at bedtime.     . metoprolol (LOPRESSOR) 50 MG tablet Take 1.5 tablets (75 mg total) by mouth 2 (two) times daily. 180 tablet 3   No current facility-administered medications for this visit.    Allergies:  Codeine   Social  History: The patient  reports that she has never smoked. She has never used smokeless tobacco. She reports that she does not drink alcohol or use drugs.   ROS:  Please see the history of present illness. Otherwise, complete review of systems is positive for none.  All other systems are reviewed and negative.   Physical Exam: VS:  BP (!) 152/92   Pulse 94   Ht 5' (1.524 m)   Wt 142 lb (64.4 kg)   SpO2 98%   BMI 27.73 kg/m , BMI Body mass index is 27.73 kg/m.  Wt Readings from Last 3 Encounters:  09/29/16 142 lb (64.4 kg)  06/29/16 149 lb (67.6 kg)  05/06/16 139 lb (63 kg)    General: Elderly woman, no distress. HEENT: Conjunctiva and lids normal, oropharynx clear. Neck: Supple, no elevated JVP or carotid bruits, no thyromegaly. Lungs: Clear to auscultation, nonlabored breathing at rest. Cardiac: Irregularly irregular, no S3 or significant systolic murmur, no pericardial rub. Abdomen: Soft, nontender, bowel sounds present, no guarding or rebound. Extremities: No pitting edema, distal pulses 2+. Skin: Warm and dry.  ECG: I personally reviewed the tracing from 06/29/2016 which showed atrial fibrillation with decreased r wave progression, nonspecific ST-T changes, relatively low voltage.  Recent Labwork: 10/27/2015: BUN 11; Creatinine, Ser 0.80; Platelets 197; Potassium 5.5; Sodium 143   Other Studies Reviewed Today:  Echocardiogram 03/02/2013: Study Conclusions  - Left ventricle: The cavity size was normal. There was mild focal basal hypertrophy with no obstruction. Systolic function was normal. The estimated ejection fraction was in the range of 60% to 65%. Wall motion was normal; there were no regional wall motion abnormalities. The study is not technically sufficient to allow evaluation of LV diastolic function. There is evidence of elevated left atrial pressure. - Aortic valve: Valve area: 1.71cm^2(VTI). - Left atrium: The atrium was mildly  dilated.  Assessment and Plan:  1. Chronic atrial fibrillation, symptomatically stable at this time. Plan to continue heart rate control strategy and Eliquis for stroke prophylaxis. Due for follow-up CBC and BMET which will be arranged.  2. Elevated blood pressure. She reports compliance with her medications. Keep follow-up with Dr. Willey Blade.  3. Hyperlipidemia, on Pravachol.  4. History of PSVT, no obvious recurrences.  Current medicines were reviewed with the patient today.   Orders Placed This Encounter  Procedures  . CBC  . Basic Metabolic Panel (BMET)    Disposition: Follow-up in 4 months.  Signed, Satira Sark, MD, [facility] 09/29/2016 11:09 AM    [facility]. [address], [place], [place] [address] Phone: [phone]; Fax: [phone]

## 2016-09-29 ENCOUNTER — Encounter: Payer: Self-pay | Admitting: Cardiology

## 2016-09-29 ENCOUNTER — Other Ambulatory Visit (HOSPITAL_COMMUNITY)
Admission: RE | Admit: 2016-09-29 | Discharge: 2016-09-29 | Disposition: A | Discharge status: Home / Self Care | Payer: PPO | Source: Ambulatory Visit | Attending: Cardiology | Admitting: Cardiology

## 2016-09-29 ENCOUNTER — Ambulatory Visit (INDEPENDENT_AMBULATORY_CARE_PROVIDER_SITE_OTHER): Payer: PPO | Admitting: Cardiology

## 2016-09-29 VITALS — BP 152/92 | HR 94 | Ht 60.0 in | Wt 142.0 lb

## 2016-09-29 DIAGNOSIS — Z79899 Other long term (current) drug therapy: Secondary | ICD-10-CM | POA: Insufficient documentation

## 2016-09-29 DIAGNOSIS — I482 Chronic atrial fibrillation, unspecified: Secondary | ICD-10-CM

## 2016-09-29 DIAGNOSIS — R030 Elevated blood-pressure reading, without diagnosis of hypertension: Secondary | ICD-10-CM | POA: Diagnosis not present

## 2016-09-29 DIAGNOSIS — E782 Mixed hyperlipidemia: Secondary | ICD-10-CM

## 2016-09-29 DIAGNOSIS — I471 Supraventricular tachycardia: Secondary | ICD-10-CM | POA: Diagnosis not present

## 2016-09-29 LAB — CBC WITH DIFFERENTIAL/PLATELET
BASOS ABS: 0 10*3/uL (ref 0.0–0.1)
BASOS PCT: 1 %
EOS PCT: 1 %
Eosinophils Absolute: 0 10*3/uL (ref 0.0–0.7)
HCT: 43.7 % (ref 36.0–46.0)
Hemoglobin: 14.6 g/dL (ref 12.0–15.0)
Lymphocytes Relative: 28 %
Lymphs Abs: 1.3 10*3/uL (ref 0.7–4.0)
MCH: 31.5 pg (ref 26.0–34.0)
MCHC: 33.4 g/dL (ref 30.0–36.0)
MCV: 94.4 fL (ref 78.0–100.0)
MONO ABS: 0.4 10*3/uL (ref 0.1–1.0)
Monocytes Relative: 8 %
Neutro Abs: 2.9 10*3/uL (ref 1.7–7.7)
Neutrophils Relative %: 62 %
Platelets: 178 10*3/uL (ref 150–400)
RBC: 4.63 MIL/uL (ref 3.87–5.11)
RDW: 13.8 % (ref 11.5–15.5)
WBC: 4.7 10*3/uL (ref 4.0–10.5)

## 2016-09-29 LAB — BASIC METABOLIC PANEL
ANION GAP: 6 (ref 5–15)
BUN: 13 mg/dL (ref 6–20)
CALCIUM: 9.5 mg/dL (ref 8.9–10.3)
CHLORIDE: 104 mmol/L (ref 101–111)
CO2: 29 mmol/L (ref 22–32)
Creatinine, Ser: 0.87 mg/dL (ref 0.44–1.00)
GFR calc Af Amer: >60 mL/min (ref >60)
GFR calc non Af Amer: 58 mL/min — ABNORMAL LOW (ref >60)
GLUCOSE: 95 mg/dL (ref 65–99)
POTASSIUM: 4.1 mmol/L (ref 3.5–5.1)
Sodium: 139 mmol/L (ref 135–145)

## 2016-09-29 NOTE — Patient Instructions (Signed)
Your physician recommends that you schedule a follow-up appointment in:  4 months    Lab work today  Cbc,bmet      Your physician recommends that you continue on your current medications as directed. Please refer to the Current Medication list given to you today.     Thank you for choosing [facility] !

## 2016-10-19 ENCOUNTER — Telehealth: Payer: Self-pay | Admitting: Cardiology

## 2016-10-19 NOTE — Telephone Encounter (Signed)
Pt would like to know if she could apply for assistance for her Eliquis.

## 2016-10-20 NOTE — Telephone Encounter (Signed)
Marcia Woods spoke with pt. She stated she will bring in financial information for pt assistance. Will supply samples for pt.

## 2016-10-28 ENCOUNTER — Telehealth: Payer: Self-pay | Admitting: *Deleted

## 2016-10-28 NOTE — Telephone Encounter (Signed)
Eliquis assistance faxed to [employer]

## 2016-11-05 ENCOUNTER — Telehealth: Payer: Self-pay | Admitting: Adult Health

## 2016-11-05 NOTE — Telephone Encounter (Signed)
Patient calling to check the status of her patient assistance application. / tg

## 2016-11-05 NOTE — Telephone Encounter (Signed)
Application sent 4/38/37, will ask [employer] LPN to check on status.LM that if she need samples of Eliquis, we have some.

## 2016-11-08 DIAGNOSIS — H353212 Exudative age-related macular degeneration, right eye, with inactive choroidal neovascularization: Secondary | ICD-10-CM | POA: Diagnosis not present

## 2016-11-08 DIAGNOSIS — H35363 Drusen (degenerative) of macula, bilateral: Secondary | ICD-10-CM | POA: Diagnosis not present

## 2016-11-08 DIAGNOSIS — H353134 Nonexudative age-related macular degeneration, bilateral, advanced atrophic with subfoveal involvement: Secondary | ICD-10-CM | POA: Diagnosis not present

## 2016-11-08 DIAGNOSIS — H353221 Exudative age-related macular degeneration, left eye, with active choroidal neovascularization: Secondary | ICD-10-CM | POA: Diagnosis not present

## 2016-11-08 DIAGNOSIS — H3562 Retinal hemorrhage, left eye: Secondary | ICD-10-CM | POA: Diagnosis not present

## 2016-11-08 NOTE — Telephone Encounter (Signed)
Called to update pt on Eliquis assistance. No answer. Left msg to call back.

## 2016-11-18 ENCOUNTER — Telehealth: Payer: Self-pay | Admitting: Cardiology

## 2016-11-18 NOTE — Telephone Encounter (Signed)
I have not received any information at this point, pt aware, application sent 6/88/64

## 2016-11-18 NOTE — Telephone Encounter (Signed)
Patient is calling to see if she has been approved for medication assistance. / tg

## 2016-11-23 ENCOUNTER — Telehealth: Payer: Self-pay | Admitting: Cardiology

## 2016-11-23 NOTE — Telephone Encounter (Signed)
Please call Carletta with [employer] @ [phone] concerning this pt's, pt assistance foundation.

## 2016-11-25 NOTE — Telephone Encounter (Signed)
Called [employer] and stayed on hold for 51 mins. Called [phone] and told that assistance program is having problems that should be fixed soon. Female given name and date of every patient in assistance program.

## 2016-12-17 ENCOUNTER — Other Ambulatory Visit: Payer: Self-pay | Admitting: Cardiology

## 2016-12-17 ENCOUNTER — Ambulatory Visit (INDEPENDENT_AMBULATORY_CARE_PROVIDER_SITE_OTHER): Payer: PPO | Admitting: Orthopedic Surgery

## 2016-12-17 ENCOUNTER — Encounter: Payer: Self-pay | Admitting: Orthopedic Surgery

## 2016-12-17 VITALS — BP 137/86 | HR 88 | Wt 145.0 lb

## 2016-12-17 DIAGNOSIS — M25562 Pain in left knee: Secondary | ICD-10-CM | POA: Diagnosis not present

## 2016-12-17 DIAGNOSIS — M25561 Pain in right knee: Secondary | ICD-10-CM | POA: Diagnosis not present

## 2016-12-17 DIAGNOSIS — G8929 Other chronic pain: Secondary | ICD-10-CM

## 2016-12-17 DIAGNOSIS — M170 Bilateral primary osteoarthritis of knee: Secondary | ICD-10-CM

## 2016-12-17 MED ORDER — TRAMADOL-ACETAMINOPHEN 37.5-325 MG PO TABS: 1.0000 | ORAL_TABLET | ORAL | 5 refills | Status: DC | PRN

## 2016-12-17 NOTE — Patient Instructions (Signed)

## 2016-12-17 NOTE — Progress Notes (Addendum)
FOLLOW UP   Chief Complaint  Patient presents with  . Knee Pain    Bilateral     Procedure note left knee injection verbal consent was obtained to inject left knee joint  Timeout was completed to confirm the site of injection  The medications used were 40 mg of Depo-Medrol and 1% lidocaine 3 cc  Anesthesia was provided by ethyl chloride and the skin was prepped with alcohol.  After cleaning the skin with alcohol a 20-gauge needle was used to inject the left knee joint. There were no complications. A sterile bandage was applied.   Procedure note right knee injection verbal consent was obtained to inject right knee joint  Timeout was completed to confirm the site of injection  The medications used were 40 mg of Depo-Medrol and 1% lidocaine 3 cc  Anesthesia was provided by ethyl chloride and the skin was prepped with alcohol.  After cleaning the skin with alcohol a 20-gauge needle was used to inject the right knee joint. There were no complications. A sterile bandage was applied.  Meds ordered this encounter  Medications  . traMADol-acetaminophen (ULTRACET) 37.5-325 MG tablet    Sig: Take 1 tablet by mouth every 4 (four) hours as needed.    Dispense:  90 tablet    Refill:  5     FU AS NEEDED

## 2017-01-28 ENCOUNTER — Telehealth: Payer: Self-pay

## 2017-01-28 NOTE — Telephone Encounter (Signed)
Called pt to let her know her Eliquis was here. She stated she will pick it up next week. Placed out front in cabinet.

## 2017-02-08 DIAGNOSIS — H353212 Exudative age-related macular degeneration, right eye, with inactive choroidal neovascularization: Secondary | ICD-10-CM | POA: Diagnosis not present

## 2017-02-08 DIAGNOSIS — H353222 Exudative age-related macular degeneration, left eye, with inactive choroidal neovascularization: Secondary | ICD-10-CM | POA: Diagnosis not present

## 2017-02-08 DIAGNOSIS — H353134 Nonexudative age-related macular degeneration, bilateral, advanced atrophic with subfoveal involvement: Secondary | ICD-10-CM | POA: Diagnosis not present

## 2017-02-08 DIAGNOSIS — H35363 Drusen (degenerative) of macula, bilateral: Secondary | ICD-10-CM | POA: Diagnosis not present

## 2017-02-08 NOTE — Progress Notes (Signed)
Cardiology Office Note  Date: 02/09/2017   ID: Marcia Woods, DOB [birth year], MRN [id number]  PCP: [name], MD  Primary Cardiologist: Rozann Lesches, MD   Chief Complaint  Patient presents with  . Atrial Fibrillation    History of Present Illness: Marcia Woods is an 83 y.o. female last seen in April. She presents for a routine follow-up visit. Reports chronic dyspnea on exertion, functional limitations related knee pain as well. She does her ADLs, but has to pacer self, cannot walk 200 feet without stopping. She does not report any exertional chest pain however, no palpitations.  I reviewed her medications. Cardiac regimen includes Eliquis, Cardizem CD, Lasix, Lopressor, and Pravachol. She will be having lab work with Dr. Willey Blade in the next month. She does not report any bleeding problems.  Lab work from April is outlined below.  Past Medical History:  Diagnosis Date  . Ankle fracture 05/22/2013  . Anxiety   . Arthritis   . Atrial fibrillation ([place])   . Constipation   . Dermatitis of lower extremity 11/18/2015  . GERD (gastroesophageal reflux disease)   . Hyperlipemia   . Macular degeneration   . Nodule of left lung    left upper lobe  . PONV (postoperative nausea and vomiting)   . PSVT (paroxysmal supraventricular tachycardia) ([place]) 03/05/2013  . Vaginal atrophy   . Vaginal odor 01/29/2014    Past Surgical History:  Procedure Laterality Date  . ABDOMINAL HYSTERECTOMY    . BRONCHIAL BIOPSY N/A 06/11/2013   Procedure: TRANSBRONCHIAL BIOPSIES;  Surgeon: Grace Isaac, MD;  Location: [facility];  Service: Thoracic;  Laterality: N/A;  . Cataract surgery     bilaterally  . CERVICAL DISC SURGERY    . CHOLECYSTECTOMY  01/26/2012   Procedure: LAPAROSCOPIC CHOLECYSTECTOMY;  Surgeon: Jamesetta So, MD;  [facility];  Service: General;  Laterality: N/A;  . COLONOSCOPY  04/30/2011   Procedure: COLONOSCOPY;  Surgeon: Rogene Houston, MD;  [facility];   Service: Endoscopy;  Laterality: N/A;  8:30 am  . ENDOBRONCHIAL ULTRASOUND N/A 06/11/2013   Procedure: ENDOBRONCHIAL ULTRASOUND;  Surgeon: Grace Isaac, MD;  Location: [facility];  Service: Thoracic;  Laterality: N/A;  . ESOPHAGOGASTRODUODENOSCOPY  05/27/2011   Procedure: ESOPHAGOGASTRODUODENOSCOPY (EGD);  Surgeon: Rogene Houston, MD;  [facility];  Service: Endoscopy;  Laterality: N/A;  300  . HEMORRHOID SURGERY    . KNEE ARTHROSCOPY  2010   left-[facility]  . ORIF WRIST FRACTURE Left 12/04/2014   Procedure: OPEN REDUCTION INTERNAL FIXATION LEFT WRIST FRACTURE;  Surgeon: Carole Civil, MD;  [facility];  Service: Orthopedics;  Laterality: Left;  . TRIGGER FINGER RELEASE     right  . VIDEO BRONCHOSCOPY WITH ENDOBRONCHIAL NAVIGATION N/A 06/11/2013   Procedure: VIDEO BRONCHOSCOPY WITH ENDOBRONCHIAL NAVIGATION;  Surgeon: Grace Isaac, MD;  [facility];  Service: Thoracic;  Laterality: N/A;    Current Outpatient Prescriptions  Medication Sig Dispense Refill  . ALPRAZolam (XANAX) 0.5 MG tablet Take 0.25-0.5 mg by mouth at bedtime. For sleep    . apixaban (ELIQUIS) 5 MG TABS tablet Take 1 tablet (5 mg total) by mouth 2 (two) times daily. 60 tablet 6  . cholecalciferol (VITAMIN D) 1000 UNITS tablet Take 1,000 Units by mouth daily.     . Clobetasol Prop Emollient Base 0.05 % emollient cream Use bid prn to affected area 30 g 0  . diltiazem (CARDIZEM CD) 240 MG 24 hr capsule TAKE (  1) CAPSULE BY MOUTH ONCE DAILY. 90 capsule 3  . furosemide (LASIX) 20 MG tablet TAKE ONE TABLET BY MOUTH ONCE DAILY. 90 tablet 3  . metoprolol (LOPRESSOR) 50 MG tablet Take 1.5 tablets (75 mg total) by mouth 2 (two) times daily. 180 tablet 3  . pantoprazole (PROTONIX) 40 MG tablet Take 40 mg by mouth every morning.     . pravastatin (PRAVACHOL) 40 MG tablet Take 40 mg by mouth at bedtime.     . traMADol-acetaminophen (ULTRACET) 37.5-325 MG tablet Take 1 tablet by mouth every 4 (four) hours  as needed. 90 tablet 5   No current facility-administered medications for this visit.    Allergies:  Codeine   Social History: The patient  reports that she has never smoked. She has never used smokeless tobacco. She reports that she does not drink alcohol or use drugs.   ROS:  Please see the history of present illness. Otherwise, complete review of systems is positive for intermittent knee pain.  All other systems are reviewed and negative.   Physical Exam: VS:  BP 122/72 (BP Location: Left Arm)   Pulse 72   Ht 5\' 5"  (1.651 m)   Wt 143 lb (64.9 kg)   SpO2 98%   BMI 23.80 kg/m , BMI Body mass index is 23.8 kg/m.  Wt Readings from Last 3 Encounters:  02/09/17 143 lb (64.9 kg)  12/17/16 145 lb (65.8 kg)  09/29/16 142 lb (64.4 kg)    General: Elderly woman, no distress. HEENT: Conjunctiva and lids normal, oropharynx clear. Neck: Supple, no elevated JVP or carotid bruits, no thyromegaly. Lungs: Clear to auscultation, nonlabored breathing at rest. Cardiac: Irregularly irregular, no S3 or significant systolic murmur, no pericardial rub. Abdomen: Soft, nontender, bowel sounds present, no guarding or rebound. Extremities: No pitting edema, distal pulses 2+. Skin: Warm and dry.  ECG: I personally reviewed the tracing from 06/29/2016 which showed atrial fibrillation with decreased r wave progression, nonspecific ST-T changes, relatively low voltage.  Recent Labwork: 09/29/2016: BUN 13; Creatinine, Ser 0.87; Hemoglobin 14.6; Platelets 178; Potassium 4.1; Sodium 139   Other Studies Reviewed Today:  Echocardiogram 03/02/2013: Study Conclusions  - Left ventricle: The cavity size was normal. There was mild focal basal hypertrophy with no obstruction. Systolic function was normal. The estimated ejection fraction was in the range of 60% to 65%. Wall motion was normal; there were no regional wall motion abnormalities. The study is not technically sufficient to allow evaluation of  LV diastolic function. There is evidence of elevated left atrial pressure. - Aortic valve: Valve area: 1.71cm^2(VTI). - Left atrium: The atrium was mildly dilated.  Assessment and Plan:  1. Chronic atrial fibrillation. Continue strategy of heart rate control and anticoagulation. She will have follow-up lab work with Dr. Willey Blade.  2. Hyperlipidemia, continues on Pravachol.  Current medicines were reviewed with the patient today.  Disposition: Follow-up in 6 months.  Signed, Satira Sark, MD, [facility] 02/09/2017 11:18 AM    [facility]. [address], [place], [place] [address] Phone: [phone]; Fax: [phone]

## 2017-02-09 ENCOUNTER — Encounter: Payer: Self-pay | Admitting: Cardiology

## 2017-02-09 ENCOUNTER — Ambulatory Visit (INDEPENDENT_AMBULATORY_CARE_PROVIDER_SITE_OTHER): Payer: PPO | Admitting: Cardiology

## 2017-02-09 VITALS — BP 122/72 | HR 72 | Ht 65.0 in | Wt 143.0 lb

## 2017-02-09 DIAGNOSIS — I482 Chronic atrial fibrillation, unspecified: Secondary | ICD-10-CM

## 2017-02-09 DIAGNOSIS — E782 Mixed hyperlipidemia: Secondary | ICD-10-CM | POA: Diagnosis not present

## 2017-02-09 NOTE — Patient Instructions (Signed)
Your physician wants you to follow-up in: 6 months with Dr.McDowell You will receive a reminder letter in the mail two months in advance. If you don't receive a letter, please call our office to schedule the follow-up appointment.    Your physician recommends that you continue on your current medications as directed. Please refer to the Current Medication list given to you today.     If you need a refill on your cardiac medications before your next appointment, please call your pharmacy.      No lab work or test ordered today.       Thank you for choosing [facility] !         

## 2017-02-24 ENCOUNTER — Other Ambulatory Visit: Payer: Self-pay | Admitting: Orthopedic Surgery

## 2017-04-12 DIAGNOSIS — G453 Amaurosis fugax: Secondary | ICD-10-CM | POA: Diagnosis not present

## 2017-04-12 DIAGNOSIS — H353134 Nonexudative age-related macular degeneration, bilateral, advanced atrophic with subfoveal involvement: Secondary | ICD-10-CM | POA: Diagnosis not present

## 2017-04-12 DIAGNOSIS — H353212 Exudative age-related macular degeneration, right eye, with inactive choroidal neovascularization: Secondary | ICD-10-CM | POA: Diagnosis not present

## 2017-04-12 DIAGNOSIS — H353222 Exudative age-related macular degeneration, left eye, with inactive choroidal neovascularization: Secondary | ICD-10-CM | POA: Diagnosis not present

## 2017-04-26 ENCOUNTER — Other Ambulatory Visit: Payer: Self-pay | Admitting: Cardiology

## 2017-05-02 ENCOUNTER — Other Ambulatory Visit (HOSPITAL_COMMUNITY): Payer: Self-pay | Admitting: Internal Medicine

## 2017-05-02 DIAGNOSIS — Z1231 Encounter for screening mammogram for malignant neoplasm of breast: Secondary | ICD-10-CM

## 2017-05-17 ENCOUNTER — Encounter: Payer: Self-pay | Admitting: Orthopedic Surgery

## 2017-05-17 ENCOUNTER — Ambulatory Visit: Payer: PPO | Admitting: Orthopedic Surgery

## 2017-05-17 VITALS — BP 141/87 | HR 71 | Ht 64.0 in | Wt 145.0 lb

## 2017-05-17 DIAGNOSIS — G8929 Other chronic pain: Secondary | ICD-10-CM

## 2017-05-17 DIAGNOSIS — M170 Bilateral primary osteoarthritis of knee: Secondary | ICD-10-CM | POA: Diagnosis not present

## 2017-05-17 DIAGNOSIS — R609 Edema, unspecified: Secondary | ICD-10-CM

## 2017-05-17 DIAGNOSIS — M25561 Pain in right knee: Secondary | ICD-10-CM

## 2017-05-17 DIAGNOSIS — M25562 Pain in left knee: Secondary | ICD-10-CM

## 2017-05-17 NOTE — Progress Notes (Signed)
Chief Complaint  Patient presents with  . Knee Pain    bilateral   . Ankle Pain    left   Patient reports new onset left ankle pain times 3 weeks  Review of systems history of venous stasis disease swelling in inability to wear TED hose  BP (!) 141/87   Pulse 71   Ht 5\' 4"  (1.626 m)   Wt 145 lb (65.8 kg)   BMI 24.89 kg/m  She presents as a well-developed well-nourished awake and alert female no acute distress  She has skin erythema and tenderness both sides of the left lower extremity mild swelling around the ankle no bony tenderness full range of motion and normal stability of the left ankle.  She is ambulatory with slight discomfort in her gait   Procedure note left knee injection verbal consent was obtained to inject left knee joint  Timeout was completed to confirm the site of injection  The medications used were 40 mg of Depo-Medrol and 1% lidocaine 3 cc  Anesthesia was provided by ethyl chloride and the skin was prepped with alcohol.  After cleaning the skin with alcohol a 20-gauge needle was used to inject the left knee joint. There were no complications. A sterile bandage was applied.   Procedure note right knee injection verbal consent was obtained to inject right knee joint  Timeout was completed to confirm the site of injection  The medications used were 40 mg of Depo-Medrol and 1% lidocaine 3 cc  Anesthesia was provided by ethyl chloride and the skin was prepped with alcohol.  After cleaning the skin with alcohol a 20-gauge needle was used to inject the right knee joint. There were no complications. A sterile bandage was applied.   Bilateral knee osteoarthritis with bilateral knee pain New problem left ankle swelling peripheral edema

## 2017-05-26 ENCOUNTER — Telehealth: Payer: Self-pay | Admitting: Radiology

## 2017-05-26 NOTE — Telephone Encounter (Signed)
Patient called wanting her AVS from last visit mailed to her, I have printed and mailed.

## 2017-05-30 ENCOUNTER — Ambulatory Visit (HOSPITAL_COMMUNITY): Payer: PPO

## 2017-07-06 ENCOUNTER — Other Ambulatory Visit: Payer: Self-pay | Admitting: Cardiology

## 2017-07-12 ENCOUNTER — Ambulatory Visit (INDEPENDENT_AMBULATORY_CARE_PROVIDER_SITE_OTHER): Payer: PPO

## 2017-07-12 ENCOUNTER — Encounter: Payer: Self-pay | Admitting: Orthopedic Surgery

## 2017-07-12 ENCOUNTER — Ambulatory Visit (INDEPENDENT_AMBULATORY_CARE_PROVIDER_SITE_OTHER): Payer: PPO | Admitting: Orthopedic Surgery

## 2017-07-12 ENCOUNTER — Ambulatory Visit: Payer: PPO

## 2017-07-12 VITALS — BP 128/87 | HR 85 | Ht 64.0 in | Wt 145.0 lb

## 2017-07-12 DIAGNOSIS — G8929 Other chronic pain: Secondary | ICD-10-CM | POA: Diagnosis not present

## 2017-07-12 DIAGNOSIS — M25562 Pain in left knee: Secondary | ICD-10-CM | POA: Diagnosis not present

## 2017-07-12 DIAGNOSIS — M25561 Pain in right knee: Secondary | ICD-10-CM

## 2017-07-12 NOTE — Addendum Note (Signed)
Addended byCandice Camp on: 07/12/2017 11:44 AM   Modules accepted: Orders

## 2017-07-12 NOTE — Progress Notes (Signed)
Progress Note   Patient ID: Marcia Woods, female   DOB: [birth year], 84 y.o.   MRN: [id number]  Chief Complaint  Patient presents with  . Knee Pain    bilateral     84 year old female presents for evaluation of bilateral knee pain  The patient has chronic bilateral knee pain and increasing pain over the last month or so with pain on the lateral joint line right greater than left radiating into the right foot but not associated with any hip or thigh pain.  It appears to be exercise related somewhat relieved with rest it is a dull pain which runs down the lateral joint line associated with lateral joint line pain no swelling     Review of Systems  Constitutional: Negative for chills and fever.  Skin: Negative.   Neurological: Negative for tingling and sensory change.       Poor balance   Current Meds  Medication Sig  . ALPRAZolam (XANAX) 0.5 MG tablet Take 0.25-0.5 mg by mouth at bedtime. For sleep  . apixaban (ELIQUIS) 5 MG TABS tablet Take 1 tablet (5 mg total) by mouth 2 (two) times daily.  . cholecalciferol (VITAMIN D) 1000 UNITS tablet Take 1,000 Units by mouth daily.   . Clobetasol Prop Emollient Base 0.05 % emollient cream Use bid prn to affected area  . diltiazem (CARDIZEM CD) 240 MG 24 hr capsule TAKE (1) CAPSULE BY MOUTH ONCE DAILY.  . furosemide (LASIX) 20 MG tablet TAKE (1) TABLET BY MOUTH ONCE DAILY.  . metoprolol tartrate (LOPRESSOR) 50 MG tablet TAKE 1 AND 1/2 TABLETS BY MOUTH TWICE A DAY.  . pantoprazole (PROTONIX) 40 MG tablet Take 40 mg by mouth every morning.   . pravastatin (PRAVACHOL) 40 MG tablet Take 40 mg by mouth at bedtime.   . promethazine (PHENERGAN) 12.5 MG tablet TAKE (1) TABLET BY MOUTH EVERY SIX HOURS AS NEEDED FOR NAUSEA AND VOMITING.  . traMADol-acetaminophen (ULTRACET) 37.5-325 MG tablet Take 1 tablet by mouth every 4 (four) hours as needed.    Past Medical History:  Diagnosis Date  . Ankle fracture 05/22/2013  . Anxiety   . Arthritis   .  Atrial fibrillation ([place])   . Constipation   . Dermatitis of lower extremity 11/18/2015  . GERD (gastroesophageal reflux disease)   . Hyperlipemia   . Macular degeneration   . Nodule of left lung    left upper lobe  . PONV (postoperative nausea and vomiting)   . PSVT (paroxysmal supraventricular tachycardia) ([place]) 03/05/2013  . Vaginal atrophy   . Vaginal odor 01/29/2014     Allergies  Allergen Reactions  . Codeine Nausea And Vomiting    BP 128/87   Pulse 85   Ht 5\' 4"  (1.626 m)   Wt 145 lb (65.8 kg)   BMI 24.89 kg/m    Physical Exam  Constitutional: She is oriented to person, place, and time. She appears well-developed and well-nourished.  Neurological: She is alert and oriented to person, place, and time.  Psychiatric: She has a normal mood and affect. Judgment normal.  Vitals reviewed.   Ortho Exam Right knee evaluation tenderness lateral joint line no swelling knee flexion a proximal 120 degrees with mild flexion contracture no instability strength is normal skin is warm dry and intact distal neurovascular exam is normal  Left knee no tenderness on the medial side mild tenderness light effusion knee flexion angle 120 degrees knee stable in all planes strength and muscle tone normal skin intact pulse and  perfusion normal sensation normal as well  Medical decision-making  Imaging:   Please see dictated report bilateral knee films show bilateral symmetric osteoarthritis lateral compartment is slightly worse than medial  Encounter Diagnosis  Name Primary?  . Chronic pain of both knees Yes    Patient agrees to bilateral knee injections   Procedure note left knee injection verbal consent was obtained to inject left knee joint  Timeout was completed to confirm the site of injection  The medications used were 40 mg of Depo-Medrol and 1% lidocaine 3 cc  Anesthesia was provided by ethyl chloride and the skin was prepped with alcohol.  After cleaning the skin with  alcohol a 20-gauge needle was used to inject the left knee joint. There were no complications. A sterile bandage was applied.   Procedure note right knee injection verbal consent was obtained to inject right knee joint  Timeout was completed to confirm the site of injection  The medications used were 40 mg of Depo-Medrol and 1% lidocaine 3 cc  Anesthesia was provided by ethyl chloride and the skin was prepped with alcohol.  After cleaning the skin with alcohol a 20-gauge needle was used to inject the right knee joint. There were no complications. A sterile bandage was applied.   FU AS NEEDED      Arther Abbott, MD 07/12/2017 11:18 AM

## 2017-08-15 NOTE — Progress Notes (Signed)
Cardiology Office Note  Date: 08/16/2017   ID: Marcia Woods, DOB [birth year], MRN [id number]  PCP: [name], MD  Primary Cardiologist: Rozann Lesches, MD   Chief Complaint  Patient presents with  . Atrial Fibrillation    History of Present Illness: Marcia Woods is an 84 y.o. female last seen in August 2018.  She presents for a routine follow-up visit.  She does not report any progressive palpitations or shortness of breath over baseline.  She is using a cane to ambulate, states that she has had no falls outside her house.  We discussed follow-up lab work on [employer], she continues to follow with Dr. Willey Blade and has a visit with lab work pending in the next few weeks.  She does not report any spontaneous bleeding problems.  I personally reviewed her ECG today which shows rate controlled atrial fibrillation with nonspecific ST-T changes.  Current medications are reviewed below.  She continues on Lopressor and Cardizem CD for heart rate control.  Past Medical History:  Diagnosis Date  . Ankle fracture 05/22/2013  . Anxiety   . Arthritis   . Atrial fibrillation ([place])   . Constipation   . Dermatitis of lower extremity 11/18/2015  . GERD (gastroesophageal reflux disease)   . Hyperlipemia   . Macular degeneration   . Nodule of left lung    left upper lobe  . PONV (postoperative nausea and vomiting)   . PSVT (paroxysmal supraventricular tachycardia) ([place]) 03/05/2013  . Vaginal atrophy   . Vaginal odor 01/29/2014    Past Surgical History:  Procedure Laterality Date  . ABDOMINAL HYSTERECTOMY    . BRONCHIAL BIOPSY N/A 06/11/2013   Procedure: TRANSBRONCHIAL BIOPSIES;  Surgeon: Grace Isaac, MD;  Location: [facility];  Service: Thoracic;  Laterality: N/A;  . Cataract surgery     bilaterally  . CERVICAL DISC SURGERY    . CHOLECYSTECTOMY  01/26/2012   Procedure: LAPAROSCOPIC CHOLECYSTECTOMY;  Surgeon: Jamesetta So, MD;  [facility];  Service: General;  Laterality: N/A;  .  COLONOSCOPY  04/30/2011   Procedure: COLONOSCOPY;  Surgeon: Rogene Houston, MD;  [facility];  Service: Endoscopy;  Laterality: N/A;  8:30 am  . ENDOBRONCHIAL ULTRASOUND N/A 06/11/2013   Procedure: ENDOBRONCHIAL ULTRASOUND;  Surgeon: Grace Isaac, MD;  Location: [facility];  Service: Thoracic;  Laterality: N/A;  . ESOPHAGOGASTRODUODENOSCOPY  05/27/2011   Procedure: ESOPHAGOGASTRODUODENOSCOPY (EGD);  Surgeon: Rogene Houston, MD;  [facility];  Service: Endoscopy;  Laterality: N/A;  300  . HEMORRHOID SURGERY    . KNEE ARTHROSCOPY  2010   left-[facility]  . ORIF WRIST FRACTURE Left 12/04/2014   Procedure: OPEN REDUCTION INTERNAL FIXATION LEFT WRIST FRACTURE;  Surgeon: Carole Civil, MD;  [facility];  Service: Orthopedics;  Laterality: Left;  . TRIGGER FINGER RELEASE     right  . VIDEO BRONCHOSCOPY WITH ENDOBRONCHIAL NAVIGATION N/A 06/11/2013   Procedure: VIDEO BRONCHOSCOPY WITH ENDOBRONCHIAL NAVIGATION;  Surgeon: Grace Isaac, MD;  [facility];  Service: Thoracic;  Laterality: N/A;    Current Outpatient Medications  Medication Sig Dispense Refill  . ALPRAZolam (XANAX) 0.5 MG tablet Take 0.25-0.5 mg by mouth at bedtime. For sleep    . apixaban (ELIQUIS) 5 MG TABS tablet Take 1 tablet (5 mg total) by mouth 2 (two) times daily. 60 tablet 6  . cholecalciferol (VITAMIN D) 1000 UNITS tablet Take 1,000 Units by mouth daily.     . Clobetasol Prop Emollient  Base 0.05 % emollient cream Use bid prn to affected area 30 g 0  . diltiazem (CARDIZEM CD) 240 MG 24 hr capsule TAKE (1) CAPSULE BY MOUTH ONCE DAILY. 90 capsule 3  . furosemide (LASIX) 20 MG tablet TAKE (1) TABLET BY MOUTH ONCE DAILY. 90 tablet 3  . metoprolol tartrate (LOPRESSOR) 50 MG tablet TAKE 1 AND 1/2 TABLETS BY MOUTH TWICE A DAY. 180 tablet 3  . pantoprazole (PROTONIX) 40 MG tablet Take 40 mg by mouth every morning.     . pravastatin (PRAVACHOL) 40 MG tablet Take 40 mg by mouth at bedtime.     .  promethazine (PHENERGAN) 12.5 MG tablet TAKE (1) TABLET BY MOUTH EVERY SIX HOURS AS NEEDED FOR NAUSEA AND VOMITING. 60 tablet 0  . traMADol-acetaminophen (ULTRACET) 37.5-325 MG tablet Take 1 tablet by mouth every 4 (four) hours as needed. 90 tablet 5   No current facility-administered medications for this visit.    Allergies:  Codeine   Social History: The patient  reports that  has never smoked. she has never used smokeless tobacco. She reports that she does not drink alcohol or use drugs.   ROS:  Please see the history of present illness. Otherwise, complete review of systems is positive for arthritic pains.  All other systems are reviewed and negative.   Physical Exam: VS:  BP 124/74 (BP Location: Right Arm)   Pulse 88   Ht 5\' 6"  (1.676 m)   Wt 133 lb (60.3 kg)   SpO2 97%   BMI 21.47 kg/m , BMI Body mass index is 21.47 kg/m.  Wt Readings from Last 3 Encounters:  08/16/17 133 lb (60.3 kg)  07/12/17 145 lb (65.8 kg)  05/17/17 145 lb (65.8 kg)    General: Elderly woman, appears comfortable at rest. HEENT: Conjunctiva and lids normal, oropharynx clear. Neck: Supple, no elevated JVP or carotid bruits, no thyromegaly. Lungs: Clear to auscultation, nonlabored breathing at rest. Cardiac: Irregularly irregular, no S3 or significant systolic murmur, no pericardial rub. Abdomen: Soft, nontender, bowel sounds present, no guarding or rebound. Extremities: No pitting edema, distal pulses 2+.  ECG: I personally reviewed the tracing from 06/29/2016 which showed atrial fibrillation with decreased r wave progression, nonspecific ST-T changes, relatively low voltage.  Recent Labwork: 09/29/2016: BUN 13; Creatinine, Ser 0.87; Hemoglobin 14.6; Platelets 178; Potassium 4.1; Sodium 139  Other Studies Reviewed Today:  Echocardiogram 03/02/2013: Study Conclusions  - Left ventricle: The cavity size was normal. There was mild focal basal hypertrophy with no obstruction. Systolic function was  normal. The estimated ejection fraction was in the range of 60% to 65%. Wall motion was normal; there were no regional wall motion abnormalities. The study is not technically sufficient to allow evaluation of LV diastolic function. There is evidence of elevated left atrial pressure. - Aortic valve: Valve area: 1.71cm^2(VTI). - Left atrium: The atrium was mildly dilated.  Assessment and Plan:  1.  Chronic atrial fibrillation.  Overall stable from a symptom perspective.  She continues on Lopressor and Cardizem CD for heart rate control.  No reported bleeding problems on Eliquis for stroke prophylaxis.  She will have follow-up lab work with Dr. Willey Blade in the next few weeks.  2.  Mixed hyperlipidemia, on Pravachol.  Current medicines were reviewed with the patient today.   Orders Placed This Encounter  Procedures  . EKG 12-Lead    Disposition: Follow-up in 6 months.  Signed, Satira Sark, MD, [facility] 08/16/2017 9:25 AM    [facility] [place]. [address] [place], [place] [address] Phone: [phone]; Fax: [phone]

## 2017-08-16 ENCOUNTER — Ambulatory Visit: Payer: PPO | Admitting: Cardiology

## 2017-08-16 ENCOUNTER — Encounter: Payer: Self-pay | Admitting: Cardiology

## 2017-08-16 VITALS — BP 124/74 | HR 88 | Ht 66.0 in | Wt 133.0 lb

## 2017-08-16 DIAGNOSIS — I4819 Other persistent atrial fibrillation: Secondary | ICD-10-CM

## 2017-08-16 DIAGNOSIS — I481 Persistent atrial fibrillation: Secondary | ICD-10-CM | POA: Diagnosis not present

## 2017-08-16 DIAGNOSIS — E782 Mixed hyperlipidemia: Secondary | ICD-10-CM

## 2017-08-16 NOTE — Patient Instructions (Signed)
Medication Instructions:  Your physician recommends that you continue on your current medications as directed. Please refer to the Current Medication list given to you today.   Labwork: NONE  Testing/Procedures: NONE  Follow-Up: Your physician wants you to follow-up in: 6 Months with Dr. McDowell. You will receive a reminder letter in the mail two months in advance. If you don't receive a letter, please call our office to schedule the follow-up appointment.   Any Other Special Instructions Will Be Listed Below (If Applicable).     If you need a refill on your cardiac medications before your next appointment, please call your pharmacy. Thank you for choosing [facility]!    

## 2017-08-17 ENCOUNTER — Ambulatory Visit: Payer: PPO | Admitting: Orthopedic Surgery

## 2017-09-03 ENCOUNTER — Emergency Department (HOSPITAL_COMMUNITY): Payer: PPO

## 2017-09-03 ENCOUNTER — Encounter (HOSPITAL_COMMUNITY): Payer: Self-pay | Admitting: Emergency Medicine

## 2017-09-03 ENCOUNTER — Emergency Department (HOSPITAL_COMMUNITY)
Admission: EM | Admit: 2017-09-03 | Discharge: 2017-09-03 | Disposition: A | Discharge status: Home / Self Care | Payer: PPO | Attending: Emergency Medicine | Admitting: Emergency Medicine

## 2017-09-03 ENCOUNTER — Other Ambulatory Visit: Payer: Self-pay

## 2017-09-03 DIAGNOSIS — Z79899 Other long term (current) drug therapy: Secondary | ICD-10-CM | POA: Insufficient documentation

## 2017-09-03 DIAGNOSIS — R51 Headache: Secondary | ICD-10-CM | POA: Diagnosis not present

## 2017-09-03 DIAGNOSIS — J984 Other disorders of lung: Secondary | ICD-10-CM | POA: Diagnosis not present

## 2017-09-03 DIAGNOSIS — R42 Dizziness and giddiness: Secondary | ICD-10-CM | POA: Diagnosis not present

## 2017-09-03 DIAGNOSIS — R519 Headache, unspecified: Secondary | ICD-10-CM

## 2017-09-03 LAB — COMPREHENSIVE METABOLIC PANEL
ALT: 32 U/L (ref 14–54)
AST: 47 U/L — AB (ref 15–41)
Albumin: 4 g/dL (ref 3.5–5.0)
Alkaline Phosphatase: 84 U/L (ref 38–126)
Anion gap: 10 (ref 5–15)
BUN: 13 mg/dL (ref 6–20)
CHLORIDE: 106 mmol/L (ref 101–111)
CO2: 25 mmol/L (ref 22–32)
Calcium: 9.8 mg/dL (ref 8.9–10.3)
Creatinine, Ser: 0.71 mg/dL (ref 0.44–1.00)
GFR calc Af Amer: >60 mL/min (ref >60)
Glucose, Bld: 139 mg/dL — ABNORMAL HIGH (ref 65–99)
POTASSIUM: 4.2 mmol/L (ref 3.5–5.1)
Sodium: 141 mmol/L (ref 135–145)
Total Bilirubin: 0.5 mg/dL (ref 0.3–1.2)
Total Protein: 7.2 g/dL (ref 6.5–8.1)

## 2017-09-03 LAB — URINALYSIS, ROUTINE W REFLEX MICROSCOPIC
BILIRUBIN URINE: NEGATIVE
Bacteria, UA: NONE SEEN
Glucose, UA: NEGATIVE mg/dL
HGB URINE DIPSTICK: NEGATIVE
Ketones, ur: NEGATIVE mg/dL
NITRITE: NEGATIVE
PROTEIN: NEGATIVE mg/dL
Specific Gravity, Urine: 1.004 — ABNORMAL LOW (ref 1.005–1.030)
pH: 7 (ref 5.0–8.0)

## 2017-09-03 LAB — CBC WITH DIFFERENTIAL/PLATELET
Basophils Absolute: 0 10*3/uL (ref 0.0–0.1)
Basophils Relative: 0 %
EOS ABS: 0.1 10*3/uL (ref 0.0–0.7)
EOS PCT: 1 %
HCT: 40.9 % (ref 36.0–46.0)
Hemoglobin: 13 g/dL (ref 12.0–15.0)
LYMPHS ABS: 1.3 10*3/uL (ref 0.7–4.0)
Lymphocytes Relative: 27 %
MCH: 30.5 pg (ref 26.0–34.0)
MCHC: 31.8 g/dL (ref 30.0–36.0)
MCV: 96 fL (ref 78.0–100.0)
Monocytes Absolute: 0.5 10*3/uL (ref 0.1–1.0)
Monocytes Relative: 10 %
Neutro Abs: 3 10*3/uL (ref 1.7–7.7)
Neutrophils Relative %: 62 %
PLATELETS: 181 10*3/uL (ref 150–400)
RBC: 4.26 MIL/uL (ref 3.87–5.11)
RDW: 14.1 % (ref 11.5–15.5)
WBC: 4.9 10*3/uL (ref 4.0–10.5)

## 2017-09-03 LAB — TROPONIN I

## 2017-09-03 MED ORDER — MECLIZINE HCL 12.5 MG PO TABS
12.5000 mg | ORAL_TABLET | Freq: Once | ORAL | Status: AC
  Administered 2017-09-03: 12.5 mg via ORAL
  Filled 2017-09-03: qty 1

## 2017-09-03 MED ORDER — MECLIZINE HCL 12.5 MG PO TABS: 12.5000 mg | ORAL_TABLET | Freq: Three times a day (TID) | ORAL | 0 refills | Status: DC | PRN

## 2017-09-03 MED ORDER — SODIUM CHLORIDE 0.9 % IV BOLUS (SEPSIS)
1000.0000 mL | Freq: Once | INTRAVENOUS | Status: AC
  Administered 2017-09-03: 1000 mL via INTRAVENOUS

## 2017-09-03 NOTE — ED Notes (Signed)
Patient verbalized understanding of discharge instructions and left facility with daughter in law to home.

## 2017-09-03 NOTE — ED Provider Notes (Signed)
Emergency Department Provider Note   I have reviewed the triage vital signs and the nursing notes.   HISTORY  Chief Complaint Dizziness   HPI Marcia Woods is a 84 y.o. female with multiple medical problems as documented below the presents the emergency department with headache.  Patient states that she gets headaches sometimes but she is not this before.  She states that she started with it last night progressively worsened since that time is associated with some "swimmy headed feeling" and a "numb feeling on the right side".  No weakness or numbness elsewhere.  No paresthesias.  No recent illnesses.  No cough or sore throat.  No fevers.  No chest pain, back pain, abdominal pain.  No new constipation or diarrhea.  She does have some intermittent nausea but not at this time.  No recent sick contacts. No other associated or modifying symptoms.    Past Medical History:  Diagnosis Date  . Ankle fracture 05/22/2013  . Anxiety   . Arthritis   . Atrial fibrillation ([place])   . Constipation   . Dermatitis of lower extremity 11/18/2015  . GERD (gastroesophageal reflux disease)   . Hyperlipemia   . Macular degeneration   . Nodule of left lung    left upper lobe  . PONV (postoperative nausea and vomiting)   . PSVT (paroxysmal supraventricular tachycardia) ([place]) 03/05/2013  . Vaginal atrophy   . Vaginal odor 01/29/2014    Patient Active Problem List   Diagnosis Date Noted  . Dermatitis of lower extremity 11/18/2015  . Fracture, radius, distal   . Vaginal odor 01/29/2014  . Leg edema 12/24/2013  . Solitary lung nodule 06/28/2013  . Atrial fibrillation ([place]) 06/01/2013  . PSVT (paroxysmal supraventricular tachycardia) ([place]) 03/05/2013  . Palpitations 03/03/2013  . Mixed hyperlipidemia 03/03/2013  . GERD (gastroesophageal reflux disease) 03/03/2013    Past Surgical History:  Procedure Laterality Date  . ABDOMINAL HYSTERECTOMY    . BRONCHIAL BIOPSY N/A 06/11/2013   Procedure:  TRANSBRONCHIAL BIOPSIES;  Surgeon: Grace Isaac, MD;  Location: [facility];  Service: Thoracic;  Laterality: N/A;  . Cataract surgery     bilaterally  . CERVICAL DISC SURGERY    . CHOLECYSTECTOMY  01/26/2012   Procedure: LAPAROSCOPIC CHOLECYSTECTOMY;  Surgeon: Jamesetta So, MD;  [facility]: [facility];  Service: General;  Laterality: N/A;  . COLONOSCOPY  04/30/2011   Procedure: COLONOSCOPY;  Surgeon: Rogene Houston, MD;  [facility];  Service: Endoscopy;  Laterality: N/A;  8:30 am  . ENDOBRONCHIAL ULTRASOUND N/A 06/11/2013   Procedure: ENDOBRONCHIAL ULTRASOUND;  Surgeon: Grace Isaac, MD;  Location: [facility];  Service: Thoracic;  Laterality: N/A;  . ESOPHAGOGASTRODUODENOSCOPY  05/27/2011   Procedure: ESOPHAGOGASTRODUODENOSCOPY (EGD);  Surgeon: Rogene Houston, MD;  [facility];  Service: Endoscopy;  Laterality: N/A;  300  . HEMORRHOID SURGERY    . KNEE ARTHROSCOPY  2010   left-[facility]  . ORIF WRIST FRACTURE Left 12/04/2014   Procedure: OPEN REDUCTION INTERNAL FIXATION LEFT WRIST FRACTURE;  Surgeon: Carole Civil, MD;  Location: [facility];  Service: Orthopedics;  Laterality: Left;  . TRIGGER FINGER RELEASE     right  . VIDEO BRONCHOSCOPY WITH ENDOBRONCHIAL NAVIGATION N/A 06/11/2013   Procedure: VIDEO BRONCHOSCOPY WITH ENDOBRONCHIAL NAVIGATION;  Surgeon: Grace Isaac, MD;  Location: [facility];  Service: Thoracic;  Laterality: N/A;    Current Outpatient Rx  . Order #: [id number] Class: Historical Med  . Order #: [id number] Class: Normal  .  Order #: [id number] Class: Historical Med  . Order #: [id number] Class: Normal  . Order #: [id number] Class: Normal  . Order #: [id number] Class: Normal  . Order #: [id number] Class: Normal  . Order #: [id number] Class: Historical Med  . Order #: [id number] Class: Historical Med  . Order #: [id number] Class: Normal  . Order #: [id number] Class: Print  . Order #: [id number] Class: Print    Allergies Codeine  Family History  Problem Relation Age of  Onset  . Heart disease Mother   . Heart disease Father   . Cancer Son        prostate  . Cancer Son        throat  . Colon cancer Neg Hx     Social History Social History   Tobacco Use  . Smoking status: Never Smoker  . Smokeless tobacco: Never Used  Substance Use Topics  . Alcohol use: No    Alcohol/week: 0.0 oz  . Drug use: No    Review of Systems  All other systems negative except as documented in the HPI. All pertinent positives and negatives as reviewed in the HPI. ____________________________________________   PHYSICAL EXAM:  VITAL SIGNS: ED Triage Vitals  Enc Vitals Group     BP 09/03/17 1853 (!) 147/86     Pulse Rate 09/03/17 1853 (!) 130     Resp 09/03/17 1853 19     Temp 09/03/17 1853 97.7 F (36.5 C)     Temp Source 09/03/17 1853 Oral     SpO2 09/03/17 1853 99 %     Weight 09/03/17 1854 133 lb (60.3 kg)     Height 09/03/17 1854 5\' 6"  (1.676 m)    Constitutional: Alert and oriented. Well appearing and in no acute distress. Eyes: Conjunctivae are normal. PERRL. EOMI. Head: Atraumatic. Nose: No congestion/rhinnorhea. Mouth/Throat: Mucous membranes are moist.  Oropharynx non-erythematous. Neck: No stridor.  No meningeal signs.   Cardiovascular: Normal rate, regular rhythm. Good peripheral circulation. Grossly normal heart sounds.   Respiratory: Normal respiratory effort.  No retractions. Lungs CTAB. Gastrointestinal: Soft and nontender. No distention.  Musculoskeletal: No lower extremity tenderness nor edema. No gross deformities of extremities. Neurologic:  Normal speech and language. No gross focal neurologic deficits are appreciated. No altered mental status, able to give full seemingly accurate history.  Face is symmetric, EOM's intact, pupils equal and reactive, vision intact, tongue and uvula midline without deviation. Upper and Lower extremity motor 5/5, intact pain perception in distal extremities, 2+ reflexes in biceps, patella and achilles  tendons. Able to perform finger to nose normal with both hands. Walks without assistance or evident ataxia.  Skin:  Skin is warm, dry and intact. No rash noted.   ____________________________________________   LABS (all labs ordered are listed, but only abnormal results are displayed)  Labs Reviewed  COMPREHENSIVE METABOLIC PANEL - Abnormal; Notable for the following components:      Result Value   Glucose, Bld 139 (*)    AST 47 (*)    All other components within normal limits  URINALYSIS, ROUTINE W REFLEX MICROSCOPIC - Abnormal; Notable for the following components:   Color, Urine STRAW (*)    Specific Gravity, Urine 1.004 (*)    Leukocytes, UA TRACE (*)    Squamous Epithelial / LPF 0-5 (*)    All other components within normal limits  CBC WITH DIFFERENTIAL/PLATELET  TROPONIN I   ____________________________________________  EKG   EKG Interpretation  Date/Time:  Saturday September 03 2017 18:56:20 EDT Ventricular Rate:  77 PR Interval:  QRS Duration: 78 QT Interval:  366 QTC Calculation: 414 R Axis:   25 Text Interpretation:  Atrial fibrillation Nonspecific ST and T wave abnormality Abnormal ECG afib similar to june 2016 Confirmed by Merrily Pew [phone]) on 09/03/2017 9:18:23 PM       ____________________________________________  RADIOLOGY  Dg Chest 2 View  Result Date: 09/03/2017 CLINICAL DATA:  Dizziness.  Headache and chills EXAM: CHEST - 2 VIEW COMPARISON:  December 02, 2014 FINDINGS: There is scarring in the left base region. There is no edema or consolidation. There is cardiomegaly with mild pulmonary venous hypertension. No adenopathy. There is postoperative change in the lower cervical spine. There is anterior wedging of T12 vertebral body, stable. IMPRESSION: Scarring left base. No edema or consolidation. Underlying pulmonary vascular congestion. Electronically Signed   By: Lowella Grip III M.D.   On: 09/03/2017 19:52   Ct Head Wo Contrast  Result Date:  09/03/2017 CLINICAL DATA:  Headache, acute, normal neuro exam. 84 year old with dizziness and generalized weakness. EXAM: CT HEAD WITHOUT CONTRAST TECHNIQUE: Contiguous axial images were obtained from the base of the skull through the vertex without intravenous contrast. COMPARISON:  Head CT 11/27/2014 FINDINGS: Brain: Unchanged degree of atrophy. Slight progression in chronic small vessel ischemia from prior exam. No intracranial hemorrhage, mass effect, or midline shift. No hydrocephalus. The basilar cisterns are patent. No evidence of territorial infarct or acute ischemia. No extra-axial or intracranial fluid collection. Vascular: Atherosclerosis of skullbase vasculature without hyperdense vessel or abnormal calcification. Skull: No fracture or focal lesion. Sinuses/Orbits: No acute finding. Mastoid air cells and paranasal sinuses are clear. Other: None. IMPRESSION: 1.  No acute intracranial abnormality. 2. Generalized atrophy and chronic small vessel ischemia. Electronically Signed   By: Jeb Levering M.D.   On: 09/03/2017 22:12    ____________________________________________   PROCEDURES  Procedure(s) performed:   Procedures   ____________________________________________   INITIAL IMPRESSION / ASSESSMENT AND PLAN / ED COURSE  Workup unremarkable for symptoms.  No evidence of head bleed, infection or other causes for symptoms.  Could be early viral process versus influenza however does not have any other symptoms to help the point in the right direction.  Patient will continue to monitor at home.  Symptoms are significantly improved in the emergency department at this point no indication for further workup or therapeutics at this time.     Pertinent labs & imaging results that were available during my care of the patient were reviewed by me and considered in my medical decision making (see chart for details).  ____________________________________________  FINAL CLINICAL  IMPRESSION(S) / ED DIAGNOSES  Final diagnoses:  Dizziness  Nonintractable headache, unspecified chronicity pattern, unspecified headache type     MEDICATIONS GIVEN DURING THIS VISIT:  Medications  sodium chloride 0.9 % bolus 1,000 mL (0 mLs Intravenous Stopped 09/03/17 2306)  meclizine (ANTIVERT) tablet 12.5 mg (12.5 mg Oral Given 09/03/17 2133)     NEW OUTPATIENT MEDICATIONS STARTED DURING THIS VISIT:  New Prescriptions   MECLIZINE (ANTIVERT) 12.5 MG TABLET    Take 1 tablet (12.5 mg total) by mouth 3 (three) times daily as needed for dizziness.    Note:  This note was prepared with assistance of Dragon voice recognition software. Occasional wrong-word or sound-a-like substitutions may have occurred due to the inherent limitations of voice recognition software.   Merrily Pew, MD 09/03/17 [phone]

## 2017-09-03 NOTE — ED Triage Notes (Signed)
PT c/o dizziness, headache, generlized weakness, cold chills, and nauseated that started this morning when she woke up.

## 2017-09-07 DIAGNOSIS — Z79899 Other long term (current) drug therapy: Secondary | ICD-10-CM | POA: Diagnosis not present

## 2017-09-07 DIAGNOSIS — E785 Hyperlipidemia, unspecified: Secondary | ICD-10-CM | POA: Diagnosis not present

## 2017-09-07 DIAGNOSIS — I482 Chronic atrial fibrillation: Secondary | ICD-10-CM | POA: Diagnosis not present

## 2017-09-15 DIAGNOSIS — E785 Hyperlipidemia, unspecified: Secondary | ICD-10-CM | POA: Diagnosis not present

## 2017-09-15 DIAGNOSIS — Z6825 Body mass index (BMI) 25.0-25.9, adult: Secondary | ICD-10-CM | POA: Diagnosis not present

## 2017-09-15 DIAGNOSIS — I482 Chronic atrial fibrillation: Secondary | ICD-10-CM | POA: Diagnosis not present

## 2017-09-15 DIAGNOSIS — Z0001 Encounter for general adult medical examination with abnormal findings: Secondary | ICD-10-CM | POA: Diagnosis not present

## 2017-10-20 DIAGNOSIS — H35363 Drusen (degenerative) of macula, bilateral: Secondary | ICD-10-CM | POA: Diagnosis not present

## 2017-10-20 DIAGNOSIS — H353212 Exudative age-related macular degeneration, right eye, with inactive choroidal neovascularization: Secondary | ICD-10-CM | POA: Diagnosis not present

## 2017-10-20 DIAGNOSIS — H353222 Exudative age-related macular degeneration, left eye, with inactive choroidal neovascularization: Secondary | ICD-10-CM | POA: Diagnosis not present

## 2017-10-20 DIAGNOSIS — H353134 Nonexudative age-related macular degeneration, bilateral, advanced atrophic with subfoveal involvement: Secondary | ICD-10-CM | POA: Diagnosis not present

## 2017-11-01 ENCOUNTER — Encounter: Payer: Self-pay | Admitting: Orthopedic Surgery

## 2017-11-01 ENCOUNTER — Ambulatory Visit: Payer: PPO | Admitting: Orthopedic Surgery

## 2017-11-01 VITALS — BP 139/82 | HR 91 | Ht 64.0 in | Wt 143.0 lb

## 2017-11-01 DIAGNOSIS — M25562 Pain in left knee: Secondary | ICD-10-CM | POA: Diagnosis not present

## 2017-11-01 DIAGNOSIS — G8929 Other chronic pain: Secondary | ICD-10-CM | POA: Diagnosis not present

## 2017-11-01 DIAGNOSIS — M25561 Pain in right knee: Secondary | ICD-10-CM

## 2017-11-01 NOTE — Progress Notes (Signed)
Chief Complaint  Patient presents with  . Knee Pain    bilateral   . Injections    Procedure note left knee injection verbal consent was obtained to inject left knee joint  Timeout was completed to confirm the site of injection  The medications used were 40 mg of Depo-Medrol and 1% lidocaine 3 cc  Anesthesia was provided by ethyl chloride and the skin was prepped with alcohol.  After cleaning the skin with alcohol a 20-gauge needle was used to inject the left knee joint. There were no complications. A sterile bandage was applied.   Procedure note right knee injection verbal consent was obtained to inject right knee joint  Timeout was completed to confirm the site of injection  The medications used were 40 mg of Depo-Medrol and 1% lidocaine 3 cc  Anesthesia was provided by ethyl chloride and the skin was prepped with alcohol.  After cleaning the skin with alcohol a 20-gauge needle was used to inject the right knee joint. There were no complications. A sterile bandage was applied.  Encounter Diagnosis  Name Primary?  . Chronic pain of both knees Yes

## 2017-12-11 IMAGING — US US CAROTID DUPLEX BILAT
1 series · 13 of 24 positions shown · non-contrast
Comparison: No prior duplex

CLINICAL DATA: 89-year-old female with a history of vision changes.

Cardiovascular risk factors include prior stroke/ TIA, known
coronary artery disease, hyperlipidemia.
EXAM:
BILATERAL CAROTID DUPLEX ULTRASOUND
TECHNIQUE: Gray scale imaging, color Doppler and duplex ultrasound were
performed of bilateral carotid and vertebral arteries in the neck.

[Series 1: us carotid duplex bilat · 0.05mm/px · 13 of 72 slices shown]
[im 1/72]
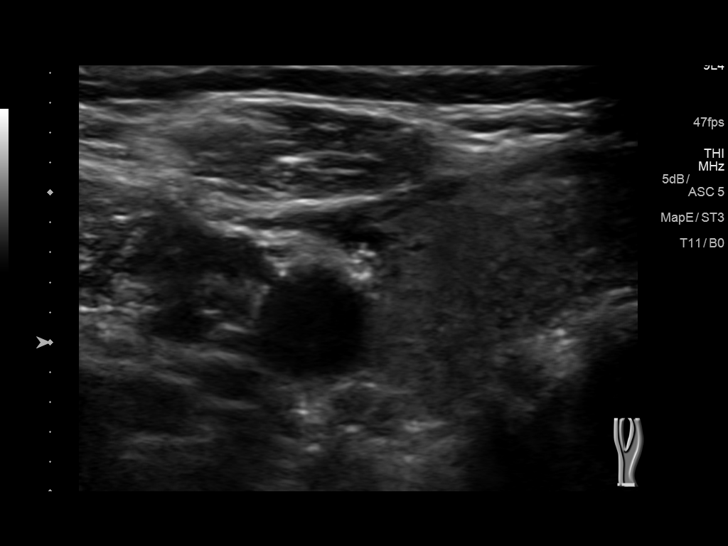
[im 7/72]
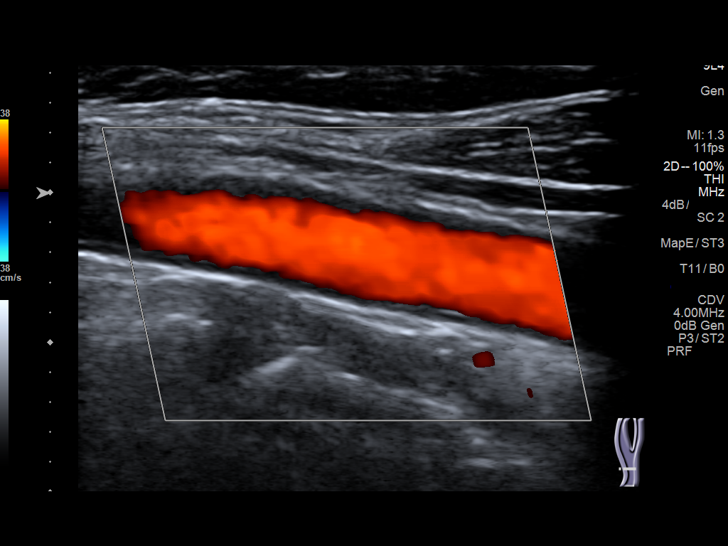
[im 13/72]
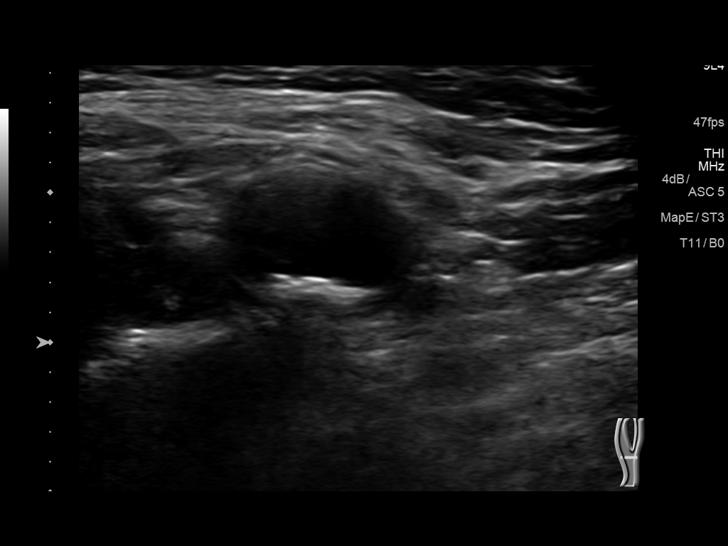
[im 19/72]
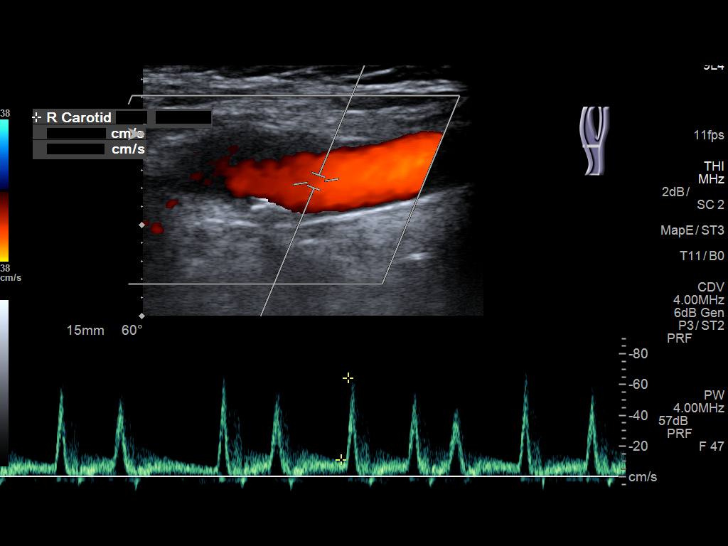
[im 25/72]
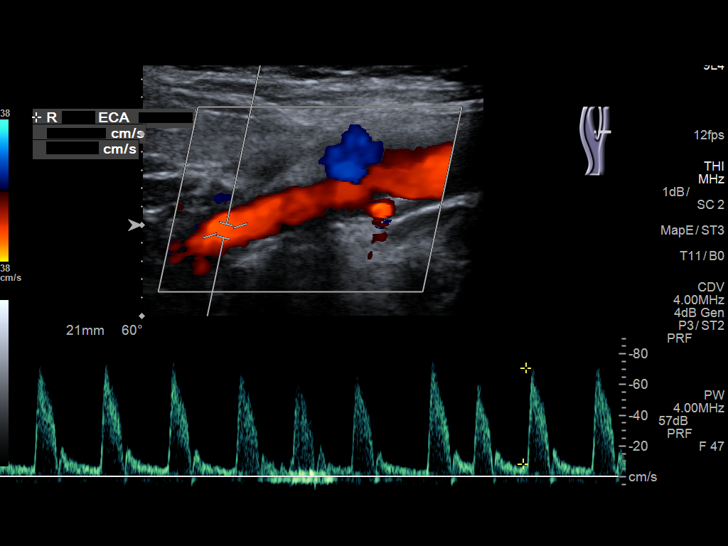
[im 31/72]
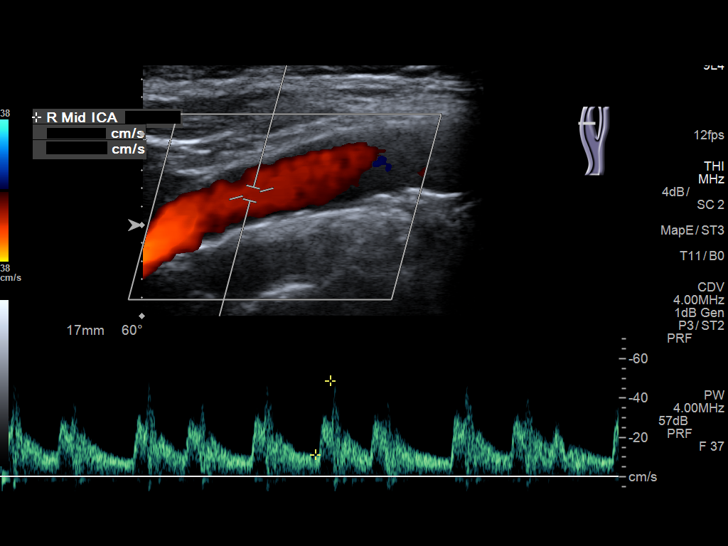
[im 38/72]
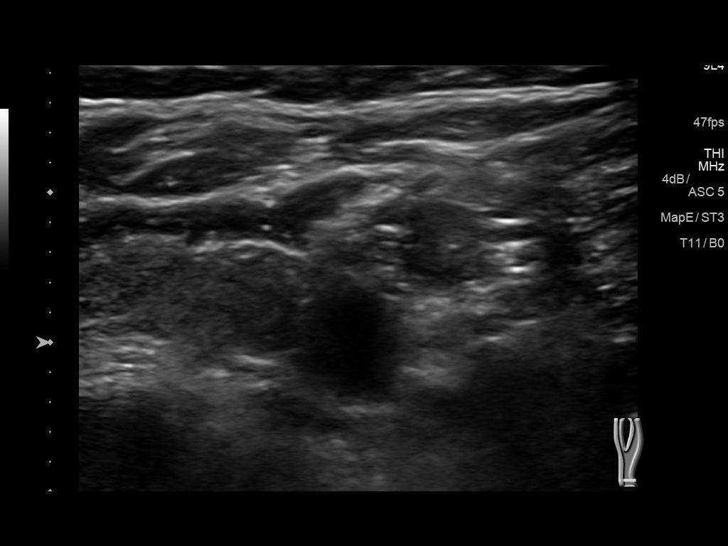
[im 41/72]
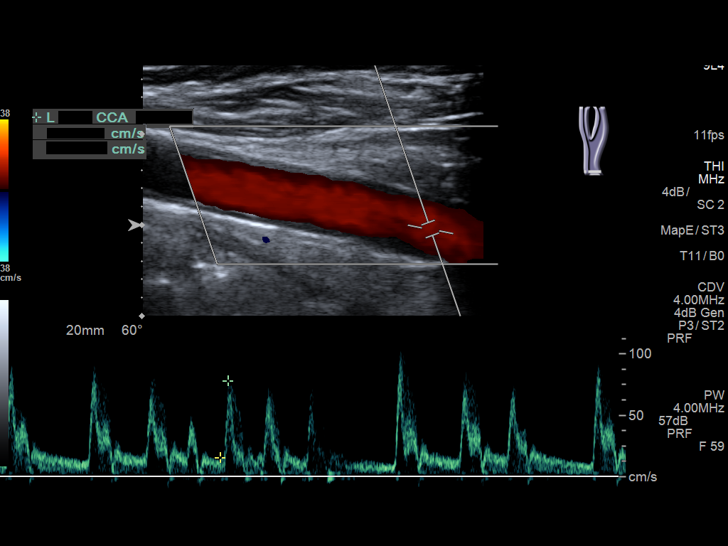
[im 47/72]
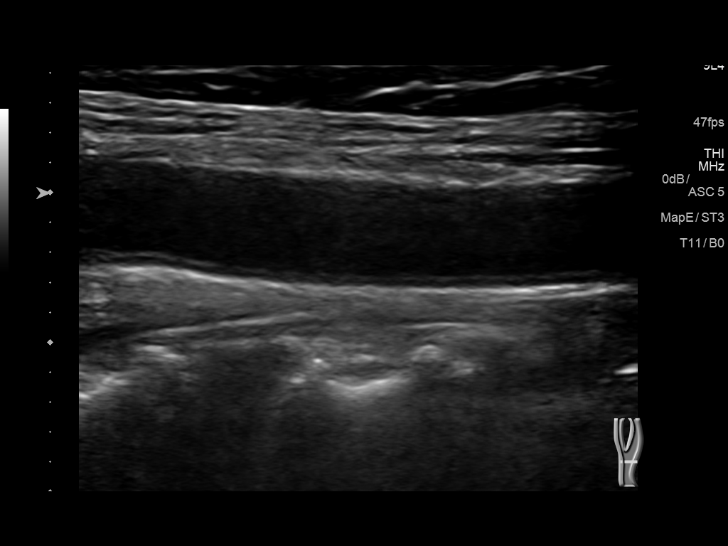
[im 53/72]
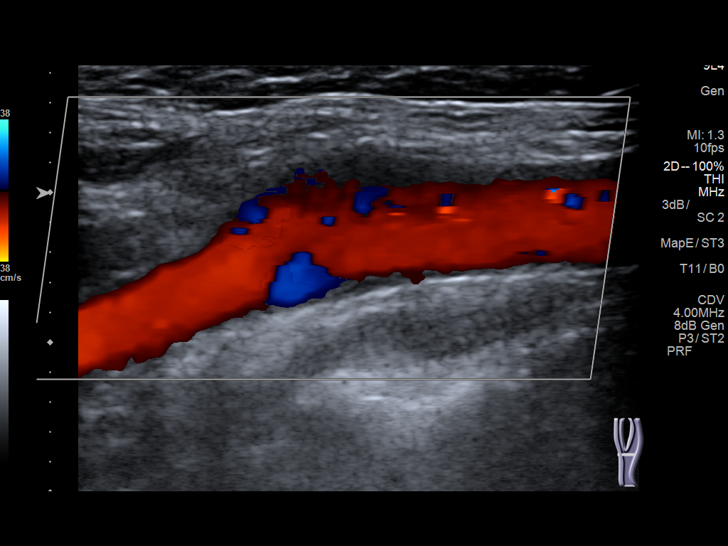
[im 59/72]
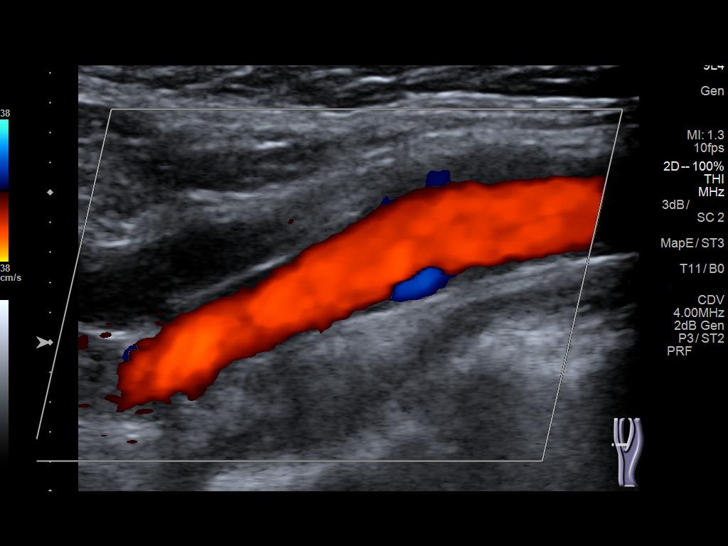
[im 65/72]
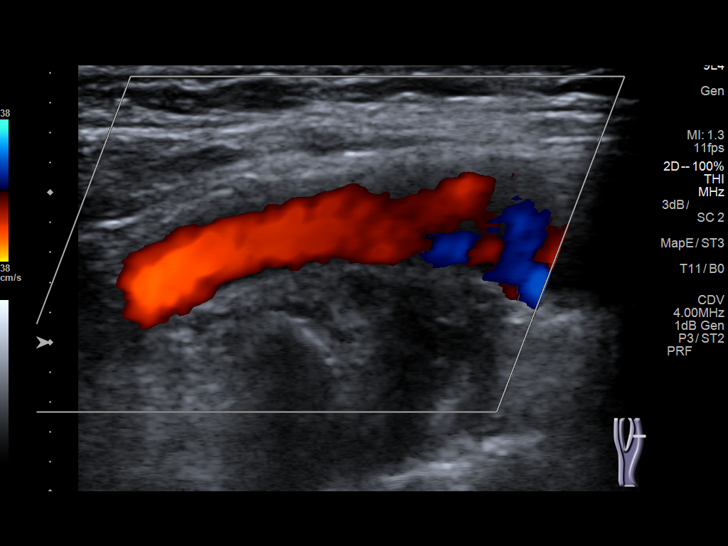
[im 72/72]
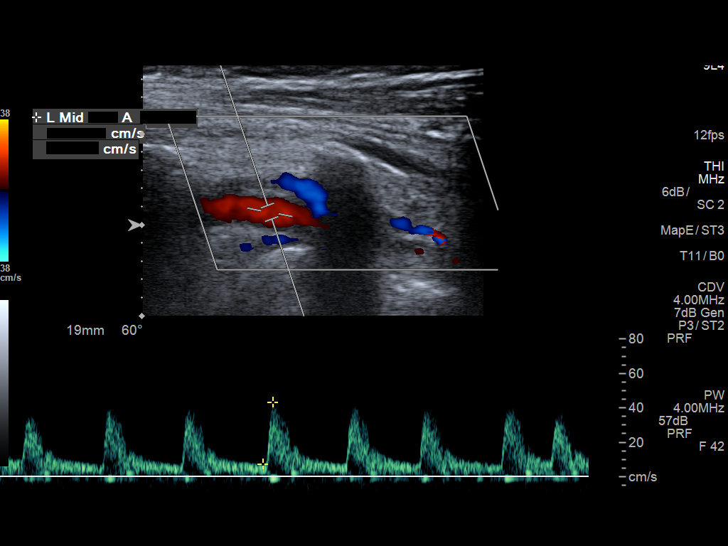

[13 of 24 positions shown; findings below may reference images not displayed]

FINDINGS: Criteria: Quantification of carotid stenosis is based on velocity
parameters that correlate the residual internal carotid diameter
with NASCET-based stenosis levels, using the diameter of the distal
internal carotid lumen as the denominator for stenosis measurement.

The following velocity measurements were obtained:

RIGHT

ICA:  Systolic 70 cm/sec, Diastolic 20 cm/sec

CCA:  73 cm/sec

SYSTOLIC ICA/CCA RATIO:

ECA:  71 cm/sec

LEFT

ICA:  Systolic 71 cm/sec, Diastolic 23 cm/sec

CCA:  80 cm/sec

SYSTOLIC ICA/CCA RATIO:

ECA:  71 cm/sec

Right Brachial SBP: Not acquired

Left Brachial SBP: Not acquired

RIGHT CAROTID ARTERY: No significant calcified disease of the right
common carotid artery. Intermediate waveform maintained.
Heterogeneous plaque without significant calcifications at the right
carotid bifurcation. Low resistance waveform of the right ICA. No
significant tortuosity.

RIGHT VERTEBRAL ARTERY: Antegrade flow with low resistance waveform.

LEFT CAROTID ARTERY: No significant calcified disease of the left
common carotid artery. Intermediate waveform maintained.
Heterogeneous plaque at the left carotid bifurcation without
significant calcifications. Low resistance waveform of the left ICA.

LEFT VERTEBRAL ARTERY:  Antegrade flow with low resistance waveform.
IMPRESSION: Color duplex indicates minimal heterogeneous plaque, with no
hemodynamically significant stenosis by duplex criteria in the
extracranial cerebrovascular circulation.

## 2017-12-15 DIAGNOSIS — L820 Inflamed seborrheic keratosis: Secondary | ICD-10-CM | POA: Diagnosis not present

## 2017-12-15 DIAGNOSIS — X32XXXD Exposure to sunlight, subsequent encounter: Secondary | ICD-10-CM | POA: Diagnosis not present

## 2017-12-15 DIAGNOSIS — L570 Actinic keratosis: Secondary | ICD-10-CM | POA: Diagnosis not present

## 2017-12-15 DIAGNOSIS — I872 Venous insufficiency (chronic) (peripheral): Secondary | ICD-10-CM | POA: Diagnosis not present

## 2018-01-20 ENCOUNTER — Other Ambulatory Visit: Payer: Self-pay | Admitting: Cardiology

## 2018-02-08 NOTE — Progress Notes (Signed)
Cardiology Office Note  Date: 02/09/2018   ID: Marcia Woods, DOB July 09, 1925, MRN [id number]  PCP: Marcia Noble, MD  Primary Cardiologist: Marcia Lesches, MD   Chief Complaint  Patient presents with  . Atrial Fibrillation    History of Present Illness: Marcia Woods is an 84 y.o. female last seen in February.  She is here for a routine follow-up visit.  Reports no palpitations or chest pain.  She has stable chronic fatigue and dyspnea with ADLs.  Also unsteady on her feet but denies any falls.  She uses a cane.  I reviewed her medications.  Cardiac regimen includes Eliquis, Cardizem CD, and Lopressor.  Heart rate is adequately controlled on examination today.  She does not report any bleeding problems other than easy bruising on her forearms.  No changes in stools.  She is due for follow-up lab work.  Past Medical History:  Diagnosis Date  . Ankle fracture 05/22/2013  . Anxiety   . Arthritis   . Atrial fibrillation ([place])   . Constipation   . Dermatitis of lower extremity 11/18/2015  . GERD (gastroesophageal reflux disease)   . Hyperlipemia   . Macular degeneration   . Nodule of left lung    left upper lobe  . PONV (postoperative nausea and vomiting)   . PSVT (paroxysmal supraventricular tachycardia) ([place]) 03/05/2013  . Vaginal atrophy   . Vaginal odor 01/29/2014    Past Surgical History:  Procedure Laterality Date  . ABDOMINAL HYSTERECTOMY    . BRONCHIAL BIOPSY N/A 06/11/2013   Procedure: TRANSBRONCHIAL BIOPSIES;  Surgeon: Grace Isaac, MD;  Location: [facility];  Service: Thoracic;  Laterality: N/A;  . Cataract surgery     bilaterally  . CERVICAL DISC SURGERY    . CHOLECYSTECTOMY  01/26/2012   Procedure: LAPAROSCOPIC CHOLECYSTECTOMY;  Surgeon: Jamesetta So, MD;  [facility];  Service: General;  Laterality: N/A;  . COLONOSCOPY  04/30/2011   Procedure: COLONOSCOPY;  Surgeon: Rogene Houston, MD;  [facility];  Service: Endoscopy;  Laterality: N/A;   8:30 am  . ENDOBRONCHIAL ULTRASOUND N/A 06/11/2013   Procedure: ENDOBRONCHIAL ULTRASOUND;  Surgeon: Grace Isaac, MD;  Location: [facility];  Service: Thoracic;  Laterality: N/A;  . ESOPHAGOGASTRODUODENOSCOPY  05/27/2011   Procedure: ESOPHAGOGASTRODUODENOSCOPY (EGD);  Surgeon: Rogene Houston, MD;  [facility];  Service: Endoscopy;  Laterality: N/A;  300  . HEMORRHOID SURGERY    . KNEE ARTHROSCOPY  2010   left-[facility]  . ORIF WRIST FRACTURE Left 12/04/2014   Procedure: OPEN REDUCTION INTERNAL FIXATION LEFT WRIST FRACTURE;  Surgeon: Carole Civil, MD;  [facility];  Service: Orthopedics;  Laterality: Left;  . TRIGGER FINGER RELEASE     right  . VIDEO BRONCHOSCOPY WITH ENDOBRONCHIAL NAVIGATION N/A 06/11/2013   Procedure: VIDEO BRONCHOSCOPY WITH ENDOBRONCHIAL NAVIGATION;  Surgeon: Grace Isaac, MD;  [facility];  Service: Thoracic;  Laterality: N/A;    Current Outpatient Medications  Medication Sig Dispense Refill  . ALPRAZolam (XANAX) 0.5 MG tablet Take 0.25-0.5 mg by mouth at bedtime. For sleep    . apixaban (ELIQUIS) 5 MG TABS tablet Take 1 tablet (5 mg total) by mouth 2 (two) times daily. 60 tablet 6  . cholecalciferol (VITAMIN D) 1000 UNITS tablet Take 1,000 Units by mouth daily.     . Clobetasol Prop Emollient Base 0.05 % emollient cream Use bid prn to affected area 30 g 0  . diltiazem (CARDIZEM CD) 240 MG  24 hr capsule TAKE (1) CAPSULE BY MOUTH ONCE DAILY. 90 capsule 3  . furosemide (LASIX) 20 MG tablet TAKE (1) TABLET BY MOUTH ONCE DAILY. 90 tablet 3  . meclizine (ANTIVERT) 12.5 MG tablet Take 1 tablet (12.5 mg total) by mouth 3 (three) times daily as needed for dizziness. 30 tablet 0  . metoprolol tartrate (LOPRESSOR) 50 MG tablet TAKE 1 AND 1/2 TABLETS BY MOUTH TWICE A DAY. 180 tablet 3  . pantoprazole (PROTONIX) 40 MG tablet Take 40 mg by mouth every morning.     . pravastatin (PRAVACHOL) 40 MG tablet Take 40 mg by mouth at bedtime.      No  current facility-administered medications for this visit.    Allergies:  Codeine   Social History: The patient  reports that she has never smoked. She has never used smokeless tobacco. She reports that she does not drink alcohol or use drugs.   ROS:  Please see the history of present illness. Otherwise, complete review of systems is positive for intermittent ankle swelling.  All other systems are reviewed and negative.   Physical Exam: VS:  BP 124/72 (BP Location: Right Arm)   Pulse 87   Ht 5\' 4"  (1.626 m)   Wt 143 lb (64.9 kg)   SpO2 98%   BMI 24.55 kg/m , BMI Body mass index is 24.55 kg/m.  Wt Readings from Last 3 Encounters:  02/09/18 143 lb (64.9 kg)  11/01/17 143 lb (64.9 kg)  09/03/17 133 lb (60.3 kg)    General: Elderly woman, appears comfortable at rest. HEENT: Conjunctiva and lids normal, oropharynx clear. Neck: Supple, no elevated JVP or carotid bruits, no thyromegaly. Lungs: Clear to auscultation, nonlabored breathing at rest. Cardiac: Irregularly irregular, no S3 or significant systolic murmur. Abdomen: Soft, nontender, bowel sounds present. Extremities: No pitting edema, distal pulses 2+.  ECG: I personally reviewed the tracing from 09/03/2017 which showed rate controlled atrial fibrillation with nonspecific ST-T changes.  Recent Labwork: 09/03/2017: ALT 32; AST 47; BUN 13; Creatinine, Ser 0.71; Hemoglobin 13.0; Platelets 181; Potassium 4.2; Sodium 141  March 2019: Cholesterol 186, triglycerides 51, HDL 96, LDL 80  Other Studies Reviewed Today:  Echocardiogram9/05/2013: Study Conclusions  - Left ventricle: The cavity size was normal. There was mild focal basal hypertrophy with no obstruction. Systolic function was normal. The estimated ejection fraction was in the range of 60% to 65%. Wall motion was normal; there were no regional wall motion abnormalities. The study is not technically sufficient to allow evaluation of LV diastolic function.  There is evidence of elevated left atrial pressure. - Aortic valve: Valve area: 1.71cm^2(VTI). - Left atrium: The atrium was mildly dilated.  Assessment and Plan:  1.  Permanent atrial fibrillation.  Continue heart rate control regimen along with Eliquis for stroke prophylaxis.  Follow-up CBC and BMET.  2.  Mixed hyperlipidemia, continues on Pravachol.  Keep follow-up with Dr. Willey Blade.  Interval LDL 80.  Current medicines were reviewed with the patient today.   Orders Placed This Encounter  Procedures  . CBC  . Basic Metabolic Panel (BMET)    Disposition: Follow-up in 6 months.  Signed, Satira Sark, MD, [facility] 02/09/2018 11:37 AM    [facility]. [address], [place], [place] [address] Phone: [phone]; Fax: [phone]

## 2018-02-09 ENCOUNTER — Other Ambulatory Visit (HOSPITAL_COMMUNITY)
Admission: RE | Admit: 2018-02-09 | Discharge: 2018-02-09 | Disposition: A | Discharge status: Home / Self Care | Payer: PPO | Source: Ambulatory Visit | Attending: Cardiology | Admitting: Cardiology

## 2018-02-09 ENCOUNTER — Encounter: Payer: Self-pay | Admitting: Cardiology

## 2018-02-09 ENCOUNTER — Ambulatory Visit: Payer: PPO | Admitting: Cardiology

## 2018-02-09 VITALS — BP 124/72 | HR 87 | Ht 64.0 in | Wt 143.0 lb

## 2018-02-09 DIAGNOSIS — I4821 Permanent atrial fibrillation: Secondary | ICD-10-CM

## 2018-02-09 DIAGNOSIS — E782 Mixed hyperlipidemia: Secondary | ICD-10-CM

## 2018-02-09 DIAGNOSIS — I482 Chronic atrial fibrillation: Secondary | ICD-10-CM

## 2018-02-09 LAB — BASIC METABOLIC PANEL
Anion gap: 9 (ref 5–15)
BUN: 8 mg/dL (ref 8–23)
CALCIUM: 9.5 mg/dL (ref 8.9–10.3)
CO2: 28 mmol/L (ref 22–32)
CREATININE: 0.78 mg/dL (ref 0.44–1.00)
Chloride: 103 mmol/L (ref 98–111)
GFR calc Af Amer: >60 mL/min (ref >60)
GLUCOSE: 80 mg/dL (ref 70–99)
Potassium: 4.3 mmol/L (ref 3.5–5.1)
Sodium: 140 mmol/L (ref 135–145)

## 2018-02-09 LAB — CBC
HCT: 41.7 % (ref 36.0–46.0)
Hemoglobin: 13.7 g/dL (ref 12.0–15.0)
MCH: 31.2 pg (ref 26.0–34.0)
MCHC: 32.9 g/dL (ref 30.0–36.0)
MCV: 95 fL (ref 78.0–100.0)
PLATELETS: 212 10*3/uL (ref 150–400)
RBC: 4.39 MIL/uL (ref 3.87–5.11)
RDW: 13.9 % (ref 11.5–15.5)
WBC: 5.2 10*3/uL (ref 4.0–10.5)

## 2018-02-09 NOTE — Patient Instructions (Signed)
Your physician wants you to follow-up in:  6 months with Dr.McDowell You will receive a reminder letter in the mail two months in advance. If you don't receive a letter, please call our office to schedule the follow-up appointment.     Get lab work today : CBC,BMET    Your physician recommends that you continue on your current medications as directed. Please refer to the Current Medication list given to you today.     If you need a refill on your cardiac medications before your next appointment, please call your pharmacy.      No testing today.       Thank you for choosing [facility] !

## 2018-03-08 ENCOUNTER — Ambulatory Visit: Payer: PPO | Admitting: Orthopedic Surgery

## 2018-03-08 ENCOUNTER — Encounter: Payer: Self-pay | Admitting: Orthopedic Surgery

## 2018-03-08 VITALS — BP 133/76 | HR 73 | Ht 64.0 in | Wt 147.0 lb

## 2018-03-08 DIAGNOSIS — M170 Bilateral primary osteoarthritis of knee: Secondary | ICD-10-CM

## 2018-03-08 DIAGNOSIS — M25562 Pain in left knee: Secondary | ICD-10-CM

## 2018-03-08 DIAGNOSIS — G8929 Other chronic pain: Secondary | ICD-10-CM

## 2018-03-08 DIAGNOSIS — M25561 Pain in right knee: Secondary | ICD-10-CM

## 2018-03-08 NOTE — Progress Notes (Signed)
Chief Complaint  Patient presents with  . Knee Pain    bilateral   Encounter Diagnoses  Name Primary?  . Chronic pain of both knees Yes  . Primary osteoarthritis of both knees      Procedure note left knee injection verbal consent was obtained to inject left knee joint  Timeout was completed to confirm the site of injection  The medications used were 40 mg of Depo-Medrol and 1% lidocaine 3 cc  Anesthesia was provided by ethyl chloride and the skin was prepped with alcohol.  After cleaning the skin with alcohol a 20-gauge needle was used to inject the left knee joint. There were no complications. A sterile bandage was applied.   Procedure note right knee injection verbal consent was obtained to inject right knee joint  Timeout was completed to confirm the site of injection  The medications used were 40 mg of Depo-Medrol and 1% lidocaine 3 cc  Anesthesia was provided by ethyl chloride and the skin was prepped with alcohol.  After cleaning the skin with alcohol a 20-gauge needle was used to inject the right knee joint. There were no complications. A sterile bandage was applied.

## 2018-03-10 ENCOUNTER — Other Ambulatory Visit: Payer: Self-pay | Admitting: Cardiology

## 2018-04-06 DIAGNOSIS — H353134 Nonexudative age-related macular degeneration, bilateral, advanced atrophic with subfoveal involvement: Secondary | ICD-10-CM | POA: Diagnosis not present

## 2018-04-06 DIAGNOSIS — H353222 Exudative age-related macular degeneration, left eye, with inactive choroidal neovascularization: Secondary | ICD-10-CM | POA: Diagnosis not present

## 2018-04-06 DIAGNOSIS — H353212 Exudative age-related macular degeneration, right eye, with inactive choroidal neovascularization: Secondary | ICD-10-CM | POA: Diagnosis not present

## 2018-04-06 DIAGNOSIS — H43811 Vitreous degeneration, right eye: Secondary | ICD-10-CM | POA: Diagnosis not present

## 2018-04-06 DIAGNOSIS — H3562 Retinal hemorrhage, left eye: Secondary | ICD-10-CM | POA: Diagnosis not present

## 2018-05-11 NOTE — Progress Notes (Signed)
Chief Complaint  Patient presents with  . Follow-up    Recheck on bilateral knees.    Encounter Diagnosis  Name Primary?  . Primary osteoarthritis of both knees Yes     Procedure note left knee injection verbal consent was obtained to inject left knee joint  Timeout was completed to confirm the site of injection  The medications used were 40 mg of Depo-Medrol and 1% lidocaine 3 cc  Anesthesia was provided by ethyl chloride and the skin was prepped with alcohol.  After cleaning the skin with alcohol a 20-gauge needle was used to inject the left knee joint. There were no complications. A sterile bandage was applied.   Procedure note right knee injection verbal consent was obtained to inject right knee joint  Timeout was completed to confirm the site of injection  The medications used were 40 mg of Depo-Medrol and 1% lidocaine 3 cc  Anesthesia was provided by ethyl chloride and the skin was prepped with alcohol.  After cleaning the skin with alcohol a 20-gauge needle was used to inject the right knee joint. There were no complications. A sterile bandage was applied.    Encounter Diagnosis  Name Primary?  . Primary osteoarthritis of both knees Yes

## 2018-05-12 ENCOUNTER — Ambulatory Visit (INDEPENDENT_AMBULATORY_CARE_PROVIDER_SITE_OTHER): Payer: PPO | Admitting: Orthopedic Surgery

## 2018-05-12 VITALS — BP 144/86 | HR 83 | Ht 64.0 in | Wt 147.0 lb

## 2018-05-12 DIAGNOSIS — M170 Bilateral primary osteoarthritis of knee: Secondary | ICD-10-CM | POA: Diagnosis not present

## 2018-05-12 NOTE — Patient Instructions (Signed)
Marcia Woods gay or  Marcia Woods

## 2018-07-17 ENCOUNTER — Telehealth: Payer: Self-pay | Admitting: Cardiology

## 2018-07-17 NOTE — Telephone Encounter (Signed)
Pt is needing samples of apixaban (ELIQUIS) 5 MG TABS tablet [[id number]]

## 2018-07-17 NOTE — Telephone Encounter (Signed)
Pt made aware, she will pick up her samples this week.

## 2018-08-07 ENCOUNTER — Telehealth: Payer: Self-pay | Admitting: Orthopedic Surgery

## 2018-08-07 NOTE — Telephone Encounter (Signed)
Patient had left a message on voicemail during our lunch stating she wanted to get an appointment with Dr. Aline Brochure. I tried calling her and somebody just hung up the phone without speaking.

## 2018-08-14 ENCOUNTER — Ambulatory Visit: Payer: PPO | Admitting: Orthopedic Surgery

## 2018-08-21 ENCOUNTER — Encounter: Payer: Self-pay | Admitting: Cardiology

## 2018-08-21 ENCOUNTER — Ambulatory Visit: Payer: PPO | Admitting: Orthopedic Surgery

## 2018-08-21 ENCOUNTER — Ambulatory Visit: Payer: PPO | Admitting: Cardiology

## 2018-08-21 VITALS — BP 134/65 | HR 74 | Ht 64.0 in | Wt 147.0 lb

## 2018-08-21 VITALS — BP 140/90 | HR 72 | Ht 64.0 in | Wt 144.0 lb

## 2018-08-21 DIAGNOSIS — M7989 Other specified soft tissue disorders: Secondary | ICD-10-CM

## 2018-08-21 DIAGNOSIS — I471 Supraventricular tachycardia: Secondary | ICD-10-CM | POA: Diagnosis not present

## 2018-08-21 DIAGNOSIS — E782 Mixed hyperlipidemia: Secondary | ICD-10-CM | POA: Diagnosis not present

## 2018-08-21 DIAGNOSIS — M170 Bilateral primary osteoarthritis of knee: Secondary | ICD-10-CM | POA: Diagnosis not present

## 2018-08-21 DIAGNOSIS — I4821 Permanent atrial fibrillation: Secondary | ICD-10-CM | POA: Diagnosis not present

## 2018-08-21 NOTE — Patient Instructions (Signed)
Medication Instructions: You may take your Lasix every OTHER day if you need to   Labwork: None today  Procedures/Testing: None today  Follow-Up: 6 months with Dr.McDowell  Any Additional Special Instructions Will Be Listed Below (If Applicable).     If you need a refill on your cardiac medications before your next appointment, please call your pharmacy.

## 2018-08-21 NOTE — Progress Notes (Signed)
Chief Complaint  Patient presents with  . Follow-up    Chronic pain of both knees.     Procedure note left knee injection verbal consent was obtained to inject left knee joint  Timeout was completed to confirm the site of injection  The medications used were 40 mg of Depo-Medrol and 1% lidocaine 3 cc  Anesthesia was provided by ethyl chloride and the skin was prepped with alcohol.  After cleaning the skin with alcohol a 20-gauge needle was used to inject the left knee joint. There were no complications. A sterile bandage was applied.   Procedure note right knee injection verbal consent was obtained to inject right knee joint  Timeout was completed to confirm the site of injection  The medications used were 40 mg of Depo-Medrol and 1% lidocaine 3 cc  Anesthesia was provided by ethyl chloride and the skin was prepped with alcohol.  After cleaning the skin with alcohol a 20-gauge needle was used to inject the right knee joint. There were no complications. A sterile bandage was applied.  Encounter Diagnosis  Name Primary?  . Primary osteoarthritis of both knees Yes    3 months

## 2018-08-21 NOTE — Progress Notes (Signed)
Cardiology Office Note  Date: 08/21/2018   ID: Marcia Woods, DOB [birth year], MRN [id number]  PCP: [name], MD  Primary Cardiologist: Rozann Lesches, MD   Chief Complaint  Patient presents with  . Atrial Fibrillation    History of Present Illness: Marcia Woods is a 85 y.o. female last seen in August 2019.  She is here today for a routine visit.  She does not report any progressive palpitations or chest pain.  Still has trouble with intermittent leg swelling.  She has been using Lasix only as needed.  Not regular with compression stockings.  She is due for follow-up lab work on [employer].  She will have this checked next month with Dr. Willey Blade.  She does not report any bleeding or changes in stool.  Does have intermittent bruising.  Heart rate control is adequate on combination of Cardizem CD and Lopressor.  I reviewed her ECG today which shows rate controlled atrial fibrillation with nonspecific ST-T changes.  Past Medical History:  Diagnosis Date  . Ankle fracture 05/22/2013  . Anxiety   . Arthritis   . Atrial fibrillation ([place])   . Constipation   . Dermatitis of lower extremity 11/18/2015  . GERD (gastroesophageal reflux disease)   . Hyperlipemia   . Macular degeneration   . Nodule of left lung   . PSVT (paroxysmal supraventricular tachycardia) ([place]) 03/05/2013  . Vaginal atrophy     Past Surgical History:  Procedure Laterality Date  . ABDOMINAL HYSTERECTOMY    . BRONCHIAL BIOPSY N/A 06/11/2013   Procedure: TRANSBRONCHIAL BIOPSIES;  Surgeon: Grace Isaac, MD;  Location: [facility];  Service: Thoracic;  Laterality: N/A;  . Cataract surgery     bilaterally  . CERVICAL DISC SURGERY    . CHOLECYSTECTOMY  01/26/2012   Procedure: LAPAROSCOPIC CHOLECYSTECTOMY;  Surgeon: Jamesetta So, MD;  [facility];  Service: General;  Laterality: N/A;  . COLONOSCOPY  04/30/2011   Procedure: COLONOSCOPY;  Surgeon: Rogene Houston, MD;  [facility];  Service: Endoscopy;   Laterality: N/A;  8:30 am  . ENDOBRONCHIAL ULTRASOUND N/A 06/11/2013   Procedure: ENDOBRONCHIAL ULTRASOUND;  Surgeon: Grace Isaac, MD;  Location: [facility];  Service: Thoracic;  Laterality: N/A;  . ESOPHAGOGASTRODUODENOSCOPY  05/27/2011   Procedure: ESOPHAGOGASTRODUODENOSCOPY (EGD);  Surgeon: Rogene Houston, MD;  [facility];  Service: Endoscopy;  Laterality: N/A;  300  . HEMORRHOID SURGERY    . KNEE ARTHROSCOPY  2010   left-[facility]  . ORIF WRIST FRACTURE Left 12/04/2014   Procedure: OPEN REDUCTION INTERNAL FIXATION LEFT WRIST FRACTURE;  Surgeon: Carole Civil, MD;  [facility];  Service: Orthopedics;  Laterality: Left;  . TRIGGER FINGER RELEASE     right  . VIDEO BRONCHOSCOPY WITH ENDOBRONCHIAL NAVIGATION N/A 06/11/2013   Procedure: VIDEO BRONCHOSCOPY WITH ENDOBRONCHIAL NAVIGATION;  Surgeon: Grace Isaac, MD;  [facility];  Service: Thoracic;  Laterality: N/A;    Current Outpatient Medications  Medication Sig Dispense Refill  . ALPRAZolam (XANAX) 0.25 MG tablet Take 1 tablet by mouth daily as needed.    Marland Kitchen apixaban (ELIQUIS) 5 MG TABS tablet Take 1 tablet (5 mg total) by mouth 2 (two) times daily. 60 tablet 6  . diltiazem (CARDIZEM CD) 240 MG 24 hr capsule TAKE (1) CAPSULE BY MOUTH ONCE DAILY. 90 capsule 3  . furosemide (LASIX) 20 MG tablet TAKE (1) TABLET BY MOUTH ONCE DAILY. 90 tablet 3  . metoprolol tartrate (LOPRESSOR) 50 MG  tablet TAKE 1 AND 1/2 TABLETS BY MOUTH TWICE A DAY. 270 tablet 2  . pantoprazole (PROTONIX) 40 MG tablet Take 40 mg by mouth every morning.     . pravastatin (PRAVACHOL) 40 MG tablet Take 40 mg by mouth at bedtime.     . cholecalciferol (VITAMIN D) 1000 UNITS tablet Take 1,000 Units by mouth daily.      No current facility-administered medications for this visit.    Allergies:  Codeine   Social History: The patient  reports that she has never smoked. She has never used smokeless tobacco. She reports that she does not drink  alcohol or use drugs.   ROS:  Please see the history of present illness. Otherwise, complete review of systems is positive for hearing loss.  All other systems are reviewed and negative.   Physical Exam: VS:  BP 140/90   Pulse 72   Ht 5\' 4"  (1.626 m)   Wt 144 lb (65.3 kg)   SpO2 98% Comment: on room air  BMI 24.72 kg/m , BMI Body mass index is 24.72 kg/m.  Wt Readings from Last 3 Encounters:  08/21/18 144 lb (65.3 kg)  08/21/18 147 lb (66.7 kg)  05/12/18 147 lb (66.7 kg)    General: Elderly woman, appears comfortable at rest. HEENT: Conjunctiva and lids normal, oropharynx clear. Neck: Supple, no elevated JVP or carotid bruits, no thyromegaly. Lungs: Clear to auscultation, nonlabored breathing at rest. Cardiac: Irregularly irregular, no S3 or significant systolic murmur. Abdomen: Soft, nontender, bowel sounds present. Extremities: Mild lower leg edema, distal pulses 2+. Skin: Warm and dry. Musculoskeletal: No kyphosis. Neuropsychiatric: Alert and oriented x3, affect grossly appropriate.  ECG: I personally reviewed the tracing from 09/03/2017 which showed rate controlled atrial fibrillation with nonspecific ST-T changes.  Recent Labwork: 09/03/2017: ALT 32; AST 47 02/09/2018: BUN 8; Creatinine, Ser 0.78; Hemoglobin 13.7; Platelets 212; Potassium 4.3; Sodium 140   Other Studies Reviewed Today:  Echocardiogram9/05/2013: Study Conclusions  - Left ventricle: The cavity size was normal. There was mild focal basal hypertrophy with no obstruction. Systolic function was normal. The estimated ejection fraction was in the range of 60% to 65%. Wall motion was normal; there were no regional wall motion abnormalities. The study is not technically sufficient to allow evaluation of LV diastolic function. There is evidence of elevated left atrial pressure. - Aortic valve: Valve area: 1.71cm^2(VTI). - Left atrium: The atrium was mildly dilated.  Assessment and Plan:  1.   Permanent atrial fibrillation.  Continue current AV nodal blockers for heart rate control.  Also on Eliquis for stroke prophylaxis.  She will get follow-up lab work with Dr. Willey Blade next month physical.  Denies any bleeding problems.  2.  Intermittent leg swelling, likely multifactorial.  She is on Lasix and does have compression stockings, but not using them regularly.  3.  Mixed hyperlipidemia on Pravachol.  She continues to follow with Dr. Willey Blade.  4.  History of PSVT, no obvious breakthrough episodes.  Current medicines were reviewed with the patient today.   Orders Placed This Encounter  Procedures  . EKG 12-Lead    Disposition: Follow-up in 6 months.  Signed, Satira Sark, MD, [facility] 08/21/2018 2:19 PM    [place] [facility] [address] [place], [place] [address] Phone: [phone]; Fax: [phone]

## 2018-09-11 ENCOUNTER — Other Ambulatory Visit: Payer: Self-pay | Admitting: Cardiology

## 2018-12-12 ENCOUNTER — Other Ambulatory Visit: Payer: Self-pay | Admitting: Cardiology

## 2018-12-18 ENCOUNTER — Ambulatory Visit: Payer: PPO | Admitting: Physician Assistant

## 2018-12-18 ENCOUNTER — Other Ambulatory Visit: Payer: Self-pay

## 2018-12-18 VITALS — BP 140/87 | HR 68 | Temp 96.6°F | Wt 145.0 lb

## 2018-12-18 DIAGNOSIS — R600 Localized edema: Secondary | ICD-10-CM

## 2018-12-18 DIAGNOSIS — E782 Mixed hyperlipidemia: Secondary | ICD-10-CM

## 2018-12-18 DIAGNOSIS — I471 Supraventricular tachycardia: Secondary | ICD-10-CM

## 2018-12-18 DIAGNOSIS — I4821 Permanent atrial fibrillation: Secondary | ICD-10-CM | POA: Diagnosis not present

## 2018-12-18 NOTE — Progress Notes (Signed)
Cardiology Office Note    Date:  12/18/2018   ID:  Marcia Woods, DOB 23-Jul-1925, MRN [id number]  PCP:  [name], MD   Cardiologist: Rozann Lesches, MD EPS: None  No chief complaint on file.   History of Present Illness:  Marcia Woods is a 85 y.o. female with history of permanent Afib on eliquis, intermittent leg swelling on prn lasix, HLD, PSVT. LOV with Dr. Domenic Polite 08/21/18 not wearing compression stockings regularly.  Patient added onto my schedule for increased leg edema.  Patient says legs sometimes worse than others. Watches her salt. Tries to keep legs elevated. Denies chest pain, dyspnea, dyspnea on exertion. Rare palptations.     Past Medical History:  Diagnosis Date  . Ankle fracture 05/22/2013  . Anxiety   . Arthritis   . Atrial fibrillation ([place])   . Constipation   . Dermatitis of lower extremity 11/18/2015  . GERD (gastroesophageal reflux disease)   . Hyperlipemia   . Macular degeneration   . Nodule of left lung   . PSVT (paroxysmal supraventricular tachycardia) ([place]) 03/05/2013  . Vaginal atrophy     Past Surgical History:  Procedure Laterality Date  . ABDOMINAL HYSTERECTOMY    . BRONCHIAL BIOPSY N/A 06/11/2013   Procedure: TRANSBRONCHIAL BIOPSIES;  Surgeon: Grace Isaac, MD;  Location: [facility];  Service: Thoracic;  Laterality: N/A;  . Cataract surgery     bilaterally  . CERVICAL DISC SURGERY    . CHOLECYSTECTOMY  01/26/2012   Procedure: LAPAROSCOPIC CHOLECYSTECTOMY;  Surgeon: Jamesetta So, MD;  [facility];  Service: General;  Laterality: N/A;  . COLONOSCOPY  04/30/2011   Procedure: COLONOSCOPY;  Surgeon: Rogene Houston, MD;  [facility];  Service: Endoscopy;  Laterality: N/A;  8:30 am  . ENDOBRONCHIAL ULTRASOUND N/A 06/11/2013   Procedure: ENDOBRONCHIAL ULTRASOUND;  Surgeon: Grace Isaac, MD;  Location: [facility];  Service: Thoracic;  Laterality: N/A;  . ESOPHAGOGASTRODUODENOSCOPY  05/27/2011   Procedure:  ESOPHAGOGASTRODUODENOSCOPY (EGD);  Surgeon: Rogene Houston, MD;  [facility];  Service: Endoscopy;  Laterality: N/A;  300  . HEMORRHOID SURGERY    . KNEE ARTHROSCOPY  2010   left-[facility]  . ORIF WRIST FRACTURE Left 12/04/2014   Procedure: OPEN REDUCTION INTERNAL FIXATION LEFT WRIST FRACTURE;  Surgeon: Carole Civil, MD;  [facility];  Service: Orthopedics;  Laterality: Left;  . TRIGGER FINGER RELEASE     right  . VIDEO BRONCHOSCOPY WITH ENDOBRONCHIAL NAVIGATION N/A 06/11/2013   Procedure: VIDEO BRONCHOSCOPY WITH ENDOBRONCHIAL NAVIGATION;  Surgeon: Grace Isaac, MD;  [facility];  Service: Thoracic;  Laterality: N/A;    Current Medications: Current Meds  Medication Sig  . ALPRAZolam (XANAX) 0.25 MG tablet Take 1 tablet by mouth daily as needed.  Marland Kitchen apixaban (ELIQUIS) 5 MG TABS tablet Take 1 tablet (5 mg total) by mouth 2 (two) times daily.  . cholecalciferol (VITAMIN D) 1000 UNITS tablet Take 1,000 Units by mouth daily.   Marland Kitchen diltiazem (CARDIZEM CD) 240 MG 24 hr capsule TAKE (1) CAPSULE BY MOUTH ONCE DAILY.  . furosemide (LASIX) 20 MG tablet TAKE (1) TABLET BY MOUTH ONCE DAILY.  . metoprolol tartrate (LOPRESSOR) 50 MG tablet TAKE 1 AND 1/2 TABLETS BY MOUTH TWICE A DAY.  . pantoprazole (PROTONIX) 40 MG tablet Take 40 mg by mouth every morning.   . pravastatin (PRAVACHOL) 40 MG tablet Take 40 mg by mouth at bedtime.      Allergies:  Codeine   Social History   Socioeconomic History  . Marital status: Widowed    Spouse name: Not on file  . Number of children: Not on file  . Years of education: Not on file  . Highest education level: Not on file  Occupational History  . Not on file  Social Needs  . Financial resource strain: Not on file  . Food insecurity    Worry: Not on file    Inability: Not on file  . Transportation needs    Medical: Not on file    Non-medical: Not on file  Tobacco Use  . Smoking status: Never Smoker  . Smokeless tobacco:  Never Used  Substance and Sexual Activity  . Alcohol use: No    Alcohol/week: 0.0 standard drinks  . Drug use: No  . Sexual activity: Yes    Birth control/protection: Surgical    Comment: hyst  Lifestyle  . Physical activity    Days per week: Not on file    Minutes per session: Not on file  . Stress: Not on file  Relationships  . Social Herbalist on phone: Not on file    Gets together: Not on file    Attends religious service: Not on file    Active member of club or organization: Not on file    Attends meetings of clubs or organizations: Not on file    Relationship status: Not on file  Other Topics Concern  . Not on file  Social History Narrative  . Not on file     Family History:  The patient's family history includes Cancer in her son and son; Heart disease in her father and mother.   ROS:   Please see the history of present illness.    ROS All other systems reviewed and are negative.   PHYSICAL EXAM:   VS:  BP 140/87   Pulse 68   Temp (!) 96.6 F (35.9 C)   Wt 145 lb (65.8 kg)   BMI 24.89 kg/m   Physical Exam  GEN: Well nourished, well developed, in no acute distress  Neck: no JVD, carotid bruits, or masses Cardiac:irreg; no murmurs, rubs, or gallops  Respiratory:  clear to auscultation bilaterally, normal work of breathing GI: soft, nontender, nondistended, + BS Ext: plus 1-2 ankle edema Neuro:  Alert and Oriented x 3 Psych: euthymic mood, full affect  Wt Readings from Last 3 Encounters:  12/18/18 145 lb (65.8 kg)  08/21/18 144 lb (65.3 kg)  08/21/18 147 lb (66.7 kg)      Studies/Labs Reviewed:   EKG:  EKG is not ordered today.   Recent Labs: 02/09/2018: BUN 8; Creatinine, Ser 0.78; Hemoglobin 13.7; Platelets 212; Potassium 4.3; Sodium 140   Lipid Panel No results found for: CHOL, TRIG, HDL, CHOLHDL, VLDL, LDLCALC, LDLDIRECT  Additional studies/ records that were reviewed today include:   Echocardiogram 03/02/2013: Study Conclusions   - Left ventricle: The cavity size was normal. There was mild   focal basal hypertrophy with no obstruction. Systolic   function was normal. The estimated ejection fraction was   in the range of 60% to 65%. Wall motion was normal; there   were no regional wall motion abnormalities. The study is   not technically sufficient to allow evaluation of LV   diastolic function. There is evidence of elevated left   atrial pressure. - Aortic valve: Valve area: 1.71cm^2(VTI). - Left atrium: The atrium was mildly dilated.  ASSESSMENT:    1. Leg edema   2. Permanent atrial fibrillation   3. Mixed hyperlipidemia   4. PSVT (paroxysmal supraventricular tachycardia) ([facility])      PLAN:  In order of problems listed above:   Leg edema-chronic and some days worse than others. Doesn't always wear compression stockings which I've asked her to do. Can take extra lasix prn for weight gain 2-3 lbs overnight or 5 lbs in a week.F/U with Dr. Domenic Polite in Sept as scheduled  Permanent Afib on Eliquis-rate controlled. No bleeding problems. Scheduled for surveillance labs by Dr. Willey Blade in July  HLD LDL 80 08/2017   PSVT-no recent palpitations   Medication Adjustments/Labs and Tests Ordered: Current medicines are reviewed at length with the patient today.  Concerns regarding medicines are outlined above.  Medication changes, Labs and Tests ordered today are listed in the Patient Instructions below. Patient Instructions  fu    Signed, Ermalinda Barrios, PA-C  12/18/2018 2:29 PM    [facility] [place], [place], [place]  [address] Phone: [phone]; Fax: [phone]

## 2018-12-18 NOTE — Patient Instructions (Addendum)
Medication Instructions:  Your physician recommends that you continue on your current medications as directed. Please refer to the Current Medication list given to you today.  You may take an extra Lasix for weight gain of 2-3 pounds overnight or 5 pounds in one week. When you take and extra lasix eat potassium rich foods.  If you need a refill on your cardiac medications before your next appointment, please call your pharmacy.   Lab work: NONE  If you have labs (blood work) drawn today and your tests are completely normal, you will receive your results only by: Marland Kitchen MyChart Message (if you have MyChart) OR . A paper copy in the mail If you have any lab test that is abnormal or we need to change your treatment, we will call you to review the results.  Testing/Procedures: NONE   Follow-Up: At [facility], you and your health needs are our priority.  As part of our continuing mission to provide you with exceptional heart care, we have created designated Provider Care Teams.  These Care Teams include your primary Cardiologist (physician) and Advanced Practice Providers (APPs -  Physician Assistants and Nurse Practitioners) who all work together to provide you with the care you need, when you need it. You will need a follow up appointment with Dr. Domenic Polite.  Please call our office 2 months in advance to schedule this appointment.  You may see Rozann Lesches, MD or one of the following Advanced Practice Providers on your designated Care Team:   Bernerd Pho, PA-C [facility]) . Ermalinda Barrios, PA-C ([place])  Any Other Special Instructions Will Be Listed Below (If Applicable). Thank you for choosing [facility]!

## 2019-02-06 DIAGNOSIS — R600 Localized edema: Secondary | ICD-10-CM | POA: Diagnosis not present

## 2019-02-21 DIAGNOSIS — R600 Localized edema: Secondary | ICD-10-CM | POA: Diagnosis not present

## 2019-02-21 DIAGNOSIS — R112 Nausea with vomiting, unspecified: Secondary | ICD-10-CM | POA: Diagnosis not present

## 2019-02-27 ENCOUNTER — Other Ambulatory Visit: Payer: Self-pay

## 2019-02-27 ENCOUNTER — Other Ambulatory Visit (HOSPITAL_COMMUNITY)
Admission: RE | Admit: 2019-02-27 | Discharge: 2019-02-27 | Disposition: A | Discharge status: Home / Self Care | Payer: PPO | Source: Ambulatory Visit | Attending: Cardiology | Admitting: Cardiology

## 2019-02-27 ENCOUNTER — Encounter: Payer: Self-pay | Admitting: Cardiology

## 2019-02-27 ENCOUNTER — Ambulatory Visit: Payer: PPO | Admitting: Cardiology

## 2019-02-27 VITALS — BP 127/70 | HR 71 | Temp 97.0°F | Ht 65.0 in | Wt 146.0 lb

## 2019-02-27 DIAGNOSIS — E782 Mixed hyperlipidemia: Secondary | ICD-10-CM

## 2019-02-27 DIAGNOSIS — I4821 Permanent atrial fibrillation: Secondary | ICD-10-CM | POA: Diagnosis not present

## 2019-02-27 DIAGNOSIS — I471 Supraventricular tachycardia: Secondary | ICD-10-CM | POA: Diagnosis not present

## 2019-02-27 DIAGNOSIS — R600 Localized edema: Secondary | ICD-10-CM | POA: Diagnosis not present

## 2019-02-27 LAB — CBC
HCT: 42.8 % (ref 36.0–46.0)
Hemoglobin: 13.3 g/dL (ref 12.0–15.0)
MCH: 29.7 pg (ref 26.0–34.0)
MCHC: 31.1 g/dL (ref 30.0–36.0)
MCV: 95.5 fL (ref 80.0–100.0)
Platelets: 233 10*3/uL (ref 150–400)
RBC: 4.48 MIL/uL (ref 3.87–5.11)
RDW: 13.2 % (ref 11.5–15.5)
WBC: 5.7 10*3/uL (ref 4.0–10.5)
nRBC: 0 % (ref 0.0–0.2)

## 2019-02-27 LAB — BASIC METABOLIC PANEL
Anion gap: 10 (ref 5–15)
BUN: 11 mg/dL (ref 8–23)
CO2: 25 mmol/L (ref 22–32)
Calcium: 9.4 mg/dL (ref 8.9–10.3)
Chloride: 101 mmol/L (ref 98–111)
Creatinine, Ser: 0.84 mg/dL (ref 0.44–1.00)
GFR calc Af Amer: >60 mL/min (ref >60)
GFR calc non Af Amer: >60 mL/min (ref >60)
Glucose, Bld: 91 mg/dL (ref 70–99)
Potassium: 4.2 mmol/L (ref 3.5–5.1)
Sodium: 136 mmol/L (ref 135–145)

## 2019-02-27 MED ORDER — FUROSEMIDE 20 MG PO TABS: ORAL_TABLET | ORAL | 3 refills | Status: DC

## 2019-02-27 NOTE — Patient Instructions (Signed)
Medication Instructions: Your physician recommends that you continue on your current medications as directed. Please refer to the Current Medication list given to you today.   Labwork: Cbc,bmet today  Procedures/Testing: None today  Follow-Up: 6 months with Dr.McDowell  Any Additional Special Instructions Will Be Listed Below (If Applicable).     If you need a refill on your cardiac medications before your next appointment, please call your pharmacy.   I refilled your lasix  Thank you for choosing [facility] !

## 2019-02-27 NOTE — Progress Notes (Signed)
Cardiology Office Note  Date: 02/27/2019   ID: Marcia Woods, DOB 30-Jul-1925, MRN [id number]  PCP:  [name], MD  Cardiologist:  Rozann Lesches, MD Electrophysiologist:  None   Chief Complaint  Patient presents with  . Cardiac follow-up    History of Present Illness: Marcia Woods is a 85 y.o. female last seen in June by Ms. Bonnell Public PA-C.  She presents for a routine follow-up visit.  She does not report any palpitations at this time, has had fluctuating degrees of leg swelling.  She is using her compression stockings today but still uses Lasix only as needed.  I reviewed her medications which are outlined below.  She does not report any spontaneous bleeding problems on Eliquis.  She has not had any recent lab work with Dr. Willey Blade by report.  Past Medical History:  Diagnosis Date  . Ankle fracture 05/22/2013  . Anxiety   . Arthritis   . Atrial fibrillation ([place])   . Constipation   . Dermatitis of lower extremity 11/18/2015  . GERD (gastroesophageal reflux disease)   . Hyperlipemia   . Macular degeneration   . Nodule of left lung   . PSVT (paroxysmal supraventricular tachycardia) ([place]) 03/05/2013  . Vaginal atrophy     Past Surgical History:  Procedure Laterality Date  . ABDOMINAL HYSTERECTOMY    . BRONCHIAL BIOPSY N/A 06/11/2013   Procedure: TRANSBRONCHIAL BIOPSIES;  Surgeon: Grace Isaac, MD;  Location: [place];  Service: Thoracic;  Laterality: N/A;  . Cataract surgery     bilaterally  . CERVICAL DISC SURGERY    . CHOLECYSTECTOMY  01/26/2012   Procedure: LAPAROSCOPIC CHOLECYSTECTOMY;  Surgeon: Jamesetta So, MD;  [facility];  Service: General;  Laterality: N/A;  . COLONOSCOPY  04/30/2011   Procedure: COLONOSCOPY;  Surgeon: Rogene Houston, MD;  [facility];  Service: Endoscopy;  Laterality: N/A;  8:30 am  . ENDOBRONCHIAL ULTRASOUND N/A 06/11/2013   Procedure: ENDOBRONCHIAL ULTRASOUND;  Surgeon: Grace Isaac, MD;  Location: [facility];  Service:  Thoracic;  Laterality: N/A;  . ESOPHAGOGASTRODUODENOSCOPY  05/27/2011   Procedure: ESOPHAGOGASTRODUODENOSCOPY (EGD);  Surgeon: Rogene Houston, MD;  [facility];  Service: Endoscopy;  Laterality: N/A;  300  . HEMORRHOID SURGERY    . KNEE ARTHROSCOPY  2010   left-[facility]  . ORIF WRIST FRACTURE Left 12/04/2014   Procedure: OPEN REDUCTION INTERNAL FIXATION LEFT WRIST FRACTURE;  Surgeon: Carole Civil, MD;  [facility];  Service: Orthopedics;  Laterality: Left;  . TRIGGER FINGER RELEASE     right  . VIDEO BRONCHOSCOPY WITH ENDOBRONCHIAL NAVIGATION N/A 06/11/2013   Procedure: VIDEO BRONCHOSCOPY WITH ENDOBRONCHIAL NAVIGATION;  Surgeon: Grace Isaac, MD;  [facility];  Service: Thoracic;  Laterality: N/A;    Current Outpatient Medications  Medication Sig Dispense Refill  . ALPRAZolam (XANAX) 0.25 MG tablet Take 1 tablet by mouth daily as needed.    Marland Kitchen apixaban (ELIQUIS) 5 MG TABS tablet Take 1 tablet (5 mg total) by mouth 2 (two) times daily. 60 tablet 6  . cholecalciferol (VITAMIN D) 1000 UNITS tablet Take 1,000 Units by mouth daily.     Marland Kitchen diltiazem (CARDIZEM CD) 240 MG 24 hr capsule TAKE (1) CAPSULE BY MOUTH ONCE DAILY. 90 capsule 3  . furosemide (LASIX) 20 MG tablet TAKE (1) TABLET BY MOUTH ONCE DAILY. 90 tablet 3  . metoprolol tartrate (LOPRESSOR) 50 MG tablet TAKE 1 AND 1/2 TABLETS BY MOUTH TWICE A DAY.  270 tablet 0  . pantoprazole (PROTONIX) 40 MG tablet Take 40 mg by mouth every morning.     . pravastatin (PRAVACHOL) 40 MG tablet Take 40 mg by mouth at bedtime.      No current facility-administered medications for this visit.    Allergies:  Codeine   Social History: The patient  reports that she has never smoked. She has never used smokeless tobacco. She reports that she does not drink alcohol or use drugs.   ROS:  Please see the history of present illness. Otherwise, complete review of systems is positive for none.  All other systems are reviewed and  negative.   Physical Exam: VS:  BP 127/70 (BP Location: Left Arm)   Pulse 71   Temp (!) 97 F (36.1 C)   Ht 5\' 5"  (1.651 m)   Wt 146 lb (66.2 kg)   SpO2 97%   BMI 24.30 kg/m , BMI Body mass index is 24.3 kg/m.  Wt Readings from Last 3 Encounters:  02/27/19 146 lb (66.2 kg)  12/18/18 145 lb (65.8 kg)  08/21/18 144 lb (65.3 kg)    General: Elderly woman, appears comfortable at rest. HEENT: Conjunctiva and lids normal, wearing a mask. Neck: Supple, no elevated JVP or carotid bruits, no thyromegaly. Lungs: Clear to auscultation, nonlabored breathing at rest. Cardiac: Irregularly irregular, no S3, soft systolic murmur. Abdomen: Soft, nontender, bowel sounds present. Extremities: 2+ ankle edema, distal pulses 2+. Skin: Warm and dry. Musculoskeletal: No kyphosis. Neuropsychiatric: Alert and oriented x3, affect grossly appropriate.  ECG:  An ECG dated 08/21/2018 was personally reviewed today and demonstrated:  Atrial fibrillation with nonspecific ST-T changes.  Recent Labwork:  August 2019: Potassium 4.3, BUN 8, creatinine 0.78, hemoglobin 13.7, platelets 212  Other Studies Reviewed Today:  Echocardiogram 03/02/2013: Study Conclusions   - Left ventricle: The cavity size was normal. There was mild  focal basal hypertrophy with no obstruction. Systolic  function was normal. The estimated ejection fraction was  in the range of 60% to 65%. Wall motion was normal; there  were no regional wall motion abnormalities. The study is  not technically sufficient to allow evaluation of LV  diastolic function. There is evidence of elevated left  atrial pressure.  - Aortic valve: Valve area: 1.71cm^2(VTI).  - Left atrium: The atrium was mildly dilated.   Assessment and Plan:  1.  Permanent atrial fibrillation.  Heart rate control is good today on combination of Cardizem CD and Lopressor.  Continue Eliquis for stroke prophylaxis.  Follow-up CBC and BMET.  2.  Leg swelling,  likely multifactorial and potentially even associated with calcium channel blocker.  This has been better with regular use of compression stockings, we refilled Lasix 20 mg to be used daily for now.  3.  Mixed hyperlipidemia on Pravachol.  Recommended follow-up with Dr. Willey Blade.  4.  History of PSVT, no obvious recurrences.  Medication Adjustments/Labs and Tests Ordered: Current medicines are reviewed at length with the patient today.  Concerns regarding medicines are outlined above.   Tests Ordered: Orders Placed This Encounter  Procedures  . CBC  . Basic Metabolic Panel (BMET)    Medication Changes: Meds ordered this encounter  Medications  . furosemide (LASIX) 20 MG tablet    Sig: TAKE (1) TABLET BY MOUTH ONCE DAILY.    Dispense:  90 tablet    Refill:  3    Disposition:  Follow up 6 months in the [place] office.  Signed, Satira Sark, MD, [facility] 02/27/2019  2:22 PM    [facility] [address] [place], [place] [address] Phone: [phone]; Fax: [phone]

## 2019-03-05 ENCOUNTER — Telehealth: Payer: Self-pay | Admitting: Cardiology

## 2019-03-05 NOTE — Telephone Encounter (Signed)
No samples available yet, pt aware

## 2019-03-06 ENCOUNTER — Other Ambulatory Visit: Payer: Self-pay | Admitting: Cardiology

## 2019-04-04 DIAGNOSIS — Z23 Encounter for immunization: Secondary | ICD-10-CM | POA: Diagnosis not present

## 2019-04-24 DIAGNOSIS — H353222 Exudative age-related macular degeneration, left eye, with inactive choroidal neovascularization: Secondary | ICD-10-CM | POA: Diagnosis not present

## 2019-04-24 DIAGNOSIS — H04123 Dry eye syndrome of bilateral lacrimal glands: Secondary | ICD-10-CM | POA: Diagnosis not present

## 2019-04-24 DIAGNOSIS — H353212 Exudative age-related macular degeneration, right eye, with inactive choroidal neovascularization: Secondary | ICD-10-CM | POA: Diagnosis not present

## 2019-04-24 DIAGNOSIS — H353134 Nonexudative age-related macular degeneration, bilateral, advanced atrophic with subfoveal involvement: Secondary | ICD-10-CM | POA: Diagnosis not present

## 2019-05-14 ENCOUNTER — Other Ambulatory Visit: Payer: Self-pay

## 2019-05-14 ENCOUNTER — Ambulatory Visit: Payer: PPO | Admitting: Orthopedic Surgery

## 2019-05-14 VITALS — BP 116/62 | HR 70 | Temp 96.9°F | Ht 65.0 in | Wt 146.0 lb

## 2019-05-14 DIAGNOSIS — M170 Bilateral primary osteoarthritis of knee: Secondary | ICD-10-CM

## 2019-05-14 NOTE — Patient Instructions (Signed)

## 2019-05-14 NOTE — Progress Notes (Signed)
Chief Complaint  Patient presents with  . Follow-up    Recheck on bilateral knees with injections.    Procedure note for bilateral knee injections  Procedure note left knee injection verbal consent was obtained to inject left knee joint  Timeout was completed to confirm the site of injection  The medications used were 40 mg of Depo-Medrol and 1% lidocaine 3 cc  Anesthesia was provided by ethyl chloride and the skin was prepped with alcohol.  After cleaning the skin with alcohol a 20-gauge needle was used to inject the left knee joint. There were no complications. A sterile bandage was applied.   Procedure note right knee injection verbal consent was obtained to inject right knee joint  Timeout was completed to confirm the site of injection  The medications used were 40 mg of Depo-Medrol and 1% lidocaine 3 cc  Anesthesia was provided by ethyl chloride and the skin was prepped with alcohol.  After cleaning the skin with alcohol a 20-gauge needle was used to inject the right knee joint. There were no complications. A sterile bandage was applied.  Encounter Diagnosis  Name Primary?  . Primary osteoarthritis of both knees Yes   Fu prn

## 2019-06-25 DIAGNOSIS — Z23 Encounter for immunization: Secondary | ICD-10-CM | POA: Diagnosis not present

## 2019-06-25 DIAGNOSIS — S81812A Laceration without foreign body, left lower leg, initial encounter: Secondary | ICD-10-CM | POA: Diagnosis not present

## 2019-09-18 DIAGNOSIS — Z79899 Other long term (current) drug therapy: Secondary | ICD-10-CM | POA: Diagnosis not present

## 2019-09-18 DIAGNOSIS — K219 Gastro-esophageal reflux disease without esophagitis: Secondary | ICD-10-CM | POA: Diagnosis not present

## 2019-09-18 DIAGNOSIS — E785 Hyperlipidemia, unspecified: Secondary | ICD-10-CM | POA: Diagnosis not present

## 2019-09-18 DIAGNOSIS — M1990 Unspecified osteoarthritis, unspecified site: Secondary | ICD-10-CM | POA: Diagnosis not present

## 2019-09-25 DIAGNOSIS — M1991 Primary osteoarthritis, unspecified site: Secondary | ICD-10-CM | POA: Diagnosis not present

## 2019-09-25 DIAGNOSIS — I482 Chronic atrial fibrillation, unspecified: Secondary | ICD-10-CM | POA: Diagnosis not present

## 2019-09-25 DIAGNOSIS — Z0001 Encounter for general adult medical examination with abnormal findings: Secondary | ICD-10-CM | POA: Diagnosis not present

## 2019-09-25 DIAGNOSIS — E785 Hyperlipidemia, unspecified: Secondary | ICD-10-CM | POA: Diagnosis not present

## 2019-09-25 DIAGNOSIS — K219 Gastro-esophageal reflux disease without esophagitis: Secondary | ICD-10-CM | POA: Diagnosis not present

## 2019-10-02 ENCOUNTER — Other Ambulatory Visit: Payer: Self-pay | Admitting: Cardiology

## 2019-10-03 ENCOUNTER — Encounter: Payer: Self-pay | Admitting: Orthopedic Surgery

## 2019-10-03 ENCOUNTER — Other Ambulatory Visit: Payer: Self-pay

## 2019-10-03 ENCOUNTER — Ambulatory Visit: Payer: PPO | Admitting: Orthopedic Surgery

## 2019-10-03 VITALS — BP 140/85 | HR 76 | Ht 65.0 in | Wt 146.0 lb

## 2019-10-03 DIAGNOSIS — M170 Bilateral primary osteoarthritis of knee: Secondary | ICD-10-CM | POA: Diagnosis not present

## 2019-10-03 DIAGNOSIS — M25562 Pain in left knee: Secondary | ICD-10-CM

## 2019-10-03 DIAGNOSIS — G8929 Other chronic pain: Secondary | ICD-10-CM

## 2019-10-03 NOTE — Patient Instructions (Signed)
Joint Steroid Injection A joint steroid injection is a procedure to relieve swelling and pain in a joint. Steroids are medicines that reduce inflammation. In this procedure, your health care provider uses a syringe and a needle to inject a steroid medicine into a painful and inflamed joint. A pain-relieving medicine (anesthetic) may be injected along with the steroid. In some cases, your health care provider may use an imaging technique such as ultrasound or fluoroscopy to guide the injection. Joints that are often treated with steroid injections include the knee, shoulder, hip, and spine. These injections may also be used in the elbow, ankle, and joints of the hands or feet. You may have joint steroid injections as part of your treatment for inflammation caused by:  Gout.  Rheumatoid arthritis.  Advanced wear-and-tear arthritis (osteoarthritis).  Tendinitis.  Bursitis. Joint steroid injections may be repeated, but having them too often can damage a joint or the skin over the joint. You should not have joint steroid injections less than 6 weeks apart or more than four times a year. Tell a health care provider about:  Any allergies you have.  All medicines you are taking, including vitamins, herbs, eye drops, creams, and over-the-counter medicines.  Any problems you or family members have had with anesthetic medicines.  Any blood disorders you have.  Any surgeries you have had.  Any medical conditions you have.  Whether you are pregnant or may be pregnant. What are the risks? Generally, this is a safe treatment. However, problems may occur, including:  Infection.  Bleeding.  Allergic reactions to medicines.  Damage to the joint or tissues around the joint.  Thinning of skin or loss of skin color over the joint.  Temporary flushing of the face or chest.  Temporary increase in pain.  Temporary increase in blood sugar.  Failure to relieve inflammation or pain. What  happens before the treatment?  You may have imaging tests of your joint.  Ask your health care provider about: ? Changing or stopping your regular medicines. This is especially important if you are taking diabetes medicines or blood thinners. ? Taking medicines such as aspirin and ibuprofen. These medicines can thin your blood. Do not take these medicines unless your health care provider tells you to take them. ? Taking over-the-counter medicines, vitamins, herbs, and supplements.  Ask your health care provider if you can drive yourself home after the procedure. What happens during the treatment?   Your health care provider will position you for the injection and locate the injection site over your joint.  The skin over the joint will be cleaned with a germ-killing soap.  Your health care provider may: ? Spray a numbing solution (topical anesthetic) over the injection site. ? Inject a local anesthetic under the skin above your joint.  The needle will be placed through your skin into your joint. Your health care provider may use imaging to guide the needle to the right spot for the injection. If imaging is used, a special contrast dye may be injected to confirm that the needle is in the correct location.  The steroid medicine will be injected into your joint.  Anesthetic may be injected along with the steroid. This may be a medicine that relieves pain for a short time (short-acting anesthetic) or for a longer time (long-acting anesthetic).  The needle will be removed, and an adhesive bandage (dressing) will be placed over the injection site. The procedure may vary among health care providers and hospitals. What can I   expect after the treatment?  You will be able to go home after the treatment.  It is normal to feel slight flushing for a few days after the injection.  After the treatment, it is common to have an increase in joint pain after the anesthetic has worn off. This may  happen about an hour after a short-acting anesthetic or about 8 hours after a longer-acting anesthetic.  You should begin to feel relief from joint pain and swelling after 24 to 48 hours. Follow these instructions at home: Injection site care  Leave the adhesive dressing over your injection site in place until your health care provider says you can remove it.  Check your injection site every day for signs of infection. Check for: ? Redness, swelling, or pain. ? Fluid or blood. ? Warmth. ? Pus or a bad smell. Activity  Return to your normal activities as told by your health care provider. Ask your health care provider what activities are safe for you. You may be asked to limit activities that put stress on the joint for a few days.  Do joint exercises as told by your health care provider.  Do not take baths, swim, or use a hot tub until your health care provider approves. Managing pain, stiffness, and swelling   If directed, put ice on the joint. ? Put ice in a plastic bag. ? Place a towel between your skin and the bag. ? Leave the ice on for 20 minutes, 2-3 times a day.  Raise (elevate) your joint above the level of your heart when you are sitting or lying down. General instructions  Take over-the-counter and prescription medicines only as told by your health care provider.  Do not use any products that contain nicotine or tobacco, such as cigarettes, e-cigarettes, and chewing tobacco. These can delay joint healing. If you need help quitting, ask your health care provider.  If you have diabetes, be aware that your blood sugar may be slightly elevated for several days after the injection.  Keep all follow-up visits as told by your health care provider. This is important. Contact a health care provider if you have:  Chills or a fever.  Any signs of infection at your injection site.  Increased pain or swelling or no relief after 2 days. Summary  A joint steroid injection  is a treatment to relieve pain and swelling in a joint.  Steroids are medicines that reduce inflammation. Your health care provider may add an anesthetic along with the steroid.  You may have joint steroid injections as part of your arthritis treatment.  Joint steroid injections may be repeated, but having them too often can damage a joint or the skin over the joint.  Contact your health care provider if you have a fever, chills, or signs of infection or if you get no relief from joint pain or swelling. This information is not intended to replace advice given to you by your health care provider. Make sure you discuss any questions you have with your health care provider. Document Revised: 02/07/2018 Document Reviewed: 02/07/2018 Elsevier Patient Education  2020 [employer]vier Inc[employer]  

## 2019-10-03 NOTE — Progress Notes (Signed)
Chief Complaint  Patient presents with  . Knee Pain    bilateral     Encounter Diagnoses  Name Primary?  . Primary osteoarthritis of both knees Yes  . Chronic pain of both knees     Marcia Woods is 89 she wants injections seem to help her  Her knees look amenable to injection with no signs of infection  Procedure note for bilateral knee injections  Procedure note left knee injection verbal consent was obtained to inject left knee joint  Timeout was completed to confirm the site of injection  The medications used were 40 mg of Depo-Medrol and 1% lidocaine 3 cc  Anesthesia was provided by ethyl chloride and the skin was prepped with alcohol.  After cleaning the skin with alcohol a 20-gauge needle was used to inject the left knee joint. There were no complications. A sterile bandage was applied.   Procedure note right knee injection verbal consent was obtained to inject right knee joint  Timeout was completed to confirm the site of injection  The medications used were 40 mg of Depo-Medrol and 1% lidocaine 3 cc  Anesthesia was provided by ethyl chloride and the skin was prepped with alcohol.  After cleaning the skin with alcohol a 20-gauge needle was used to inject the right knee joint. There were no complications. A sterile bandage was applied.

## 2019-10-18 ENCOUNTER — Encounter: Payer: Self-pay | Admitting: Cardiology

## 2019-10-18 ENCOUNTER — Telehealth (INDEPENDENT_AMBULATORY_CARE_PROVIDER_SITE_OTHER): Payer: PPO | Admitting: Cardiology

## 2019-10-18 VITALS — Ht 61.0 in | Wt 147.0 lb

## 2019-10-18 DIAGNOSIS — M7989 Other specified soft tissue disorders: Secondary | ICD-10-CM

## 2019-10-18 DIAGNOSIS — I4821 Permanent atrial fibrillation: Secondary | ICD-10-CM

## 2019-10-18 NOTE — Patient Instructions (Signed)
Medication Instructions: Your physician recommends that you continue on your current medications as directed. Please refer to the Current Medication list given to you today.   Labwork: None today  Procedures/Testing: None today  Follow-Up: 6 months office visit with Dr.McDowell  Any Additional Special Instructions Will Be Listed Below (If Applicable).     If you need a refill on your cardiac medications before your next appointment, please call your pharmacy.       Thank you for choosing [facility] !

## 2019-10-18 NOTE — Progress Notes (Signed)
Virtual Visit via Telephone Note   This visit type was conducted due to national recommendations for restrictions regarding the COVID-19 Pandemic (e.g. social distancing) in an effort to limit this patient's exposure and mitigate transmission in our community.  Due to her co-morbid illnesses, this patient is at least at moderate risk for complications without adequate follow up.  This format is felt to be most appropriate for this patient at this time.  The patient did not have access to video technology/had technical difficulties with video requiring transitioning to audio format only (telephone).  All issues noted in this document were discussed and addressed.  No physical exam could be performed with this format.  Please refer to the patient's chart for her  consent to telehealth for [facility].   The patient was identified using 2 identifiers.  Date:  10/18/2019   ID:  Marcia Woods, DOB 02-29-1926, MRN [id number]  Patient Location: Home Provider Location: Home  PCP:  Marcia Noble, MD  Cardiologist:  Marcia Lesches, MD Electrophysiologist:  None   Evaluation Performed:  Follow-Up Visit  Chief Complaint:  Cardiac follow-up  History of Present Illness:    Marcia Woods is a 86 y.o. female last seen in September 2020.  I spoke with her daughter Marcia Woods by phone today.  [name] is still living in her own house, remains functional with basic ADLs and also cooking.  Marcia Woods tells me that [name] has not been complaining of any new chest pain or palpitations, has not had any bleeding problems on Eliquis.  She continues to have issues with intermittent leg swelling but is aware to use compression stockings and her Lasix.  I reviewed her medications which are outlined below.  We are also requesting her most recent lab work from Marcia Woods.   Past Medical History:  Diagnosis Date  . Ankle fracture 05/22/2013  . Anxiety   . Arthritis   . Atrial fibrillation ([place])   . Constipation    . Dermatitis of lower extremity 11/18/2015  . GERD (gastroesophageal reflux disease)   . Hyperlipemia   . Macular degeneration   . Nodule of left lung   . PSVT (paroxysmal supraventricular tachycardia) ([place]) 03/05/2013  . Vaginal atrophy    Past Surgical History:  Procedure Laterality Date  . ABDOMINAL HYSTERECTOMY    . BRONCHIAL BIOPSY N/A 06/11/2013   Procedure: TRANSBRONCHIAL BIOPSIES;  Surgeon: Marcia Isaac, MD;  Location: [facility];  Service: Thoracic;  Laterality: N/A;  . Cataract surgery     bilaterally  . CERVICAL DISC SURGERY    . CHOLECYSTECTOMY  01/26/2012   Procedure: LAPAROSCOPIC CHOLECYSTECTOMY;  Surgeon: Marcia So, MD;  [facility];  Service: General;  Laterality: N/A;  . COLONOSCOPY  04/30/2011   Procedure: COLONOSCOPY;  Surgeon: Marcia Houston, MD;  [facility];  Service: Endoscopy;  Laterality: N/A;  8:30 am  . ENDOBRONCHIAL ULTRASOUND N/A 06/11/2013   Procedure: ENDOBRONCHIAL ULTRASOUND;  Surgeon: Marcia Isaac, MD;  Location: [place];  Service: Thoracic;  Laterality: N/A;  . ESOPHAGOGASTRODUODENOSCOPY  05/27/2011   Procedure: ESOPHAGOGASTRODUODENOSCOPY (EGD);  Surgeon: Marcia Houston, MD;  [facility];  Service: Endoscopy;  Laterality: N/A;  300  . HEMORRHOID SURGERY    . KNEE ARTHROSCOPY  2010   left-[facility]  . ORIF WRIST FRACTURE Left 12/04/2014   Procedure: OPEN REDUCTION INTERNAL FIXATION LEFT WRIST FRACTURE;  Surgeon: Marcia Civil, MD;  [facility];  Service: Orthopedics;  Laterality:  Left;  . TRIGGER FINGER RELEASE     right  . VIDEO BRONCHOSCOPY WITH ENDOBRONCHIAL NAVIGATION N/A 06/11/2013   Procedure: VIDEO BRONCHOSCOPY WITH ENDOBRONCHIAL NAVIGATION;  Surgeon: Marcia Isaac, MD;  Location: [place];  Service: Thoracic;  Laterality: N/A;     Current Meds  Medication Sig  . ALPRAZolam (XANAX) 0.25 MG tablet Take 1 tablet by mouth daily as needed.  Marland Kitchen apixaban (ELIQUIS) 5 MG TABS tablet Take 1 tablet (5 mg  total) by mouth 2 (two) times daily.  . cholecalciferol (VITAMIN D) 1000 UNITS tablet Take 1,000 Units by mouth daily.   Marland Kitchen diltiazem (CARDIZEM CD) 240 MG 24 hr capsule TAKE (1) CAPSULE BY MOUTH ONCE DAILY.  . furosemide (LASIX) 20 MG tablet TAKE (1) TABLET BY MOUTH ONCE DAILY.  . metoprolol tartrate (LOPRESSOR) 50 MG tablet TAKE 1 AND 1/2 TABLETS BY MOUTH TWICE A DAY.  . pantoprazole (PROTONIX) 40 MG tablet Take 40 mg by mouth every morning.   . pravastatin (PRAVACHOL) 40 MG tablet Take 40 mg by mouth at bedtime.      Allergies:   Codeine   ROS:   Intermittent leg swelling  Prior CV studies:   The following studies were reviewed today:  Echocardiogram 03/02/2013: Study Conclusions   - Left ventricle: The cavity size was normal. There was mild  focal basal hypertrophy with no obstruction. Systolic  function was normal. The estimated ejection fraction was  in the range of 60% to 65%. Wall motion was normal; there  were no regional wall motion abnormalities. The study is  not technically sufficient to allow evaluation of LV  diastolic function. There is evidence of elevated left  atrial pressure.  - Aortic valve: Valve area: 1.71cm^2(VTI).  - Left atrium: The atrium was mildly dilated.   Labs/Other Tests and Data Reviewed:    EKG:  An ECG dated 08/21/2018 was personally reviewed today and demonstrated:  Atrial fibrillation with nonspecific ST-T changes.  Recent Labs: 02/27/2019: BUN 11; Creatinine, Ser 0.84; Hemoglobin 13.3; Platelets 233; Potassium 4.2; Sodium 136    Wt Readings from Last 3 Encounters:  10/18/19 147 lb (66.7 kg)  10/03/19 146 lb (66.2 kg)  05/14/19 146 lb (66.2 kg)     Objective:    Vital Signs:  Ht 5\' 1"  (1.549 m)   Wt 147 lb (66.7 kg)   BMI 27.78 kg/m    Unable to obtain vital signs today. I spoke with the patient's daughter Marcia Woods.  ASSESSMENT & PLAN:    1.  Permanent atrial fibrillation.  CHA2DS2-VASc score is at least 3.  She  continues on Eliquis for stroke prophylaxis.  We are requesting her most recent lab work from Marcia Woods.  Continue heart rate control strategy on Cardizem CD and Lopressor.  2.  Intermittent leg swelling.  Continue use of compression stockings and as needed to daily Lasix.   Time:   Today, I have spent 7 minutes with the patient with telehealth technology discussing the above problems.     Medication Adjustments/Labs and Tests Ordered: Current medicines are reviewed at length with the patient today.  Concerns regarding medicines are outlined above.   Tests Ordered: No orders of the defined types were placed in this encounter.   Medication Changes: No orders of the defined types were placed in this encounter.   Follow Up:  In Person 6 months in the [place] office.  Signed, Marcia Lesches, MD  10/18/2019 9:18 AM    [place]

## 2019-11-27 ENCOUNTER — Telehealth: Payer: Self-pay | Admitting: Orthopedic Surgery

## 2019-11-27 NOTE — Telephone Encounter (Signed)
Discussed appointment for knee problem; scheduled follow up appointment. Patient mentioned an issue with top of foot swelling; will call primary care for this problem, and will call if needs to schedule for foot at other appointment time.

## 2019-11-30 DIAGNOSIS — R609 Edema, unspecified: Secondary | ICD-10-CM | POA: Diagnosis not present

## 2019-12-18 ENCOUNTER — Other Ambulatory Visit: Payer: Self-pay | Admitting: Cardiology

## 2020-01-02 ENCOUNTER — Ambulatory Visit: Payer: PPO | Admitting: Orthopedic Surgery

## 2020-01-02 ENCOUNTER — Other Ambulatory Visit: Payer: Self-pay

## 2020-01-02 DIAGNOSIS — M25562 Pain in left knee: Secondary | ICD-10-CM

## 2020-01-02 DIAGNOSIS — M25561 Pain in right knee: Secondary | ICD-10-CM | POA: Diagnosis not present

## 2020-01-02 DIAGNOSIS — G8929 Other chronic pain: Secondary | ICD-10-CM

## 2020-01-02 NOTE — Progress Notes (Signed)
Chief Complaint  Patient presents with  . Knee Pain    Bilateral/here for injections/hurts like usual    Encounter Diagnosis  Name Primary?  . Chronic pain of both knees Yes    There were no vitals taken for this visit.  Procedure note for bilateral knee injections  Procedure note left knee injection verbal consent was obtained to inject left knee joint  Timeout was completed to confirm the site of injection  The medications used were 40 mg of Depo-Medrol and 1% lidocaine 3 cc  Anesthesia was provided by ethyl chloride and the skin was prepped with alcohol.  After cleaning the skin with alcohol a 20-gauge needle was used to inject the left knee joint. There were no complications. A sterile bandage was applied.   Procedure note right knee injection verbal consent was obtained to inject right knee joint  Timeout was completed to confirm the site of injection  The medications used were 40 mg of Depo-Medrol and 1% lidocaine 3 cc  Anesthesia was provided by ethyl chloride and the skin was prepped with alcohol.  After cleaning the skin with alcohol a 20-gauge needle was used to inject the right knee joint. There were no complications. A sterile bandage was applied.

## 2020-01-17 ENCOUNTER — Other Ambulatory Visit: Payer: Self-pay | Admitting: Cardiology

## 2020-01-22 ENCOUNTER — Encounter (INDEPENDENT_AMBULATORY_CARE_PROVIDER_SITE_OTHER): Payer: PPO | Admitting: Ophthalmology

## 2020-03-31 ENCOUNTER — Other Ambulatory Visit: Payer: Self-pay | Admitting: Cardiology

## 2020-04-28 NOTE — Progress Notes (Signed)
Cardiology Office Note  Date: 04/29/2020   ID: Marcia Woods, DOB September 20, 1925, MRN [id number]  PCP:  [name], MD  Cardiologist:  Rozann Lesches, MD Electrophysiologist:  None   Chief Complaint  Patient presents with  . Cardiac follow-up    History of Present Illness: Marcia Woods is a 86 y.o. female last assessed via telehealth encounter in April.  She presents for a routine visit.  Reports no increasing sense of palpitations or shortness of breath.  Still lives in her own home, functional with basic ADLs.  She states that her family checks on her regularly.  I reviewed her medications which are stable from a cardiac perspective and outlined below.  She had lab work back in March and anticipates follow-up with Dr. Willey Blade around the first of the year.  She just recently celebrated her 89nd birthday.  I reviewed her ECG today which shows rate controlled atrial fibrillation with nonspecific ST-T changes.  Past Medical History:  Diagnosis Date  . Ankle fracture 05/22/2013  . Anxiety   . Arthritis   . Atrial fibrillation ([place])   . Constipation   . Dermatitis of lower extremity 11/18/2015  . GERD (gastroesophageal reflux disease)   . Hyperlipemia   . Macular degeneration   . Nodule of left lung   . PSVT (paroxysmal supraventricular tachycardia) ([place]) 03/05/2013  . Vaginal atrophy     Past Surgical History:  Procedure Laterality Date  . ABDOMINAL HYSTERECTOMY    . BRONCHIAL BIOPSY N/A 06/11/2013   Procedure: TRANSBRONCHIAL BIOPSIES;  Surgeon: Grace Isaac, MD;  Location: [place];  Service: Thoracic;  Laterality: N/A;  . Cataract surgery     bilaterally  . CERVICAL DISC SURGERY    . CHOLECYSTECTOMY  01/26/2012   Procedure: LAPAROSCOPIC CHOLECYSTECTOMY;  Surgeon: Jamesetta So, MD;  [facility];  Service: General;  Laterality: N/A;  . COLONOSCOPY  04/30/2011   Procedure: COLONOSCOPY;  Surgeon: Rogene Houston, MD;  [facility];  Service: Endoscopy;   Laterality: N/A;  8:30 am  . ENDOBRONCHIAL ULTRASOUND N/A 06/11/2013   Procedure: ENDOBRONCHIAL ULTRASOUND;  Surgeon: Grace Isaac, MD;  Location: [place];  Service: Thoracic;  Laterality: N/A;  . ESOPHAGOGASTRODUODENOSCOPY  05/27/2011   Procedure: ESOPHAGOGASTRODUODENOSCOPY (EGD);  Surgeon: Rogene Houston, MD;  [facility];  Service: Endoscopy;  Laterality: N/A;  300  . HEMORRHOID SURGERY    . KNEE ARTHROSCOPY  2010   left-[facility]  . ORIF WRIST FRACTURE Left 12/04/2014   Procedure: OPEN REDUCTION INTERNAL FIXATION LEFT WRIST FRACTURE;  Surgeon: Carole Civil, MD;  [facility];  Service: Orthopedics;  Laterality: Left;  . TRIGGER FINGER RELEASE     right  . VIDEO BRONCHOSCOPY WITH ENDOBRONCHIAL NAVIGATION N/A 06/11/2013   Procedure: VIDEO BRONCHOSCOPY WITH ENDOBRONCHIAL NAVIGATION;  Surgeon: Grace Isaac, MD;  [facility];  Service: Thoracic;  Laterality: N/A;    Current Outpatient Medications  Medication Sig Dispense Refill  . ALPRAZolam (XANAX) 0.25 MG tablet Take 1 tablet by mouth daily as needed.    Marland Kitchen apixaban (ELIQUIS) 5 MG TABS tablet Take 1 tablet (5 mg total) by mouth 2 (two) times daily. 84 tablet 0  . cholecalciferol (VITAMIN D) 1000 UNITS tablet Take 1,000 Units by mouth daily.     Marland Kitchen diltiazem (CARDIZEM CD) 240 MG 24 hr capsule TAKE (1) CAPSULE BY MOUTH ONCE DAILY. 90 capsule 3  . furosemide (LASIX) 20 MG tablet TAKE (1) TABLET BY MOUTH  ONCE DAILY. 90 tablet 3  . metoprolol tartrate (LOPRESSOR) 50 MG tablet TAKE 1 AND 1/2 TABLETS BY MOUTH TWICE A DAY. 270 tablet 3  . pantoprazole (PROTONIX) 40 MG tablet Take 40 mg by mouth every morning.     . pravastatin (PRAVACHOL) 40 MG tablet Take 40 mg by mouth at bedtime.      No current facility-administered medications for this visit.   Allergies:  Codeine   ROS: Leg swelling adequately controlled.  Physical Exam: VS:  BP 130/80   Pulse 78   Ht 5\' 1"  (1.549 m)   Wt 145 lb 3.2 oz (65.9 kg)    SpO2 97%   BMI 27.44 kg/m , BMI Body mass index is 27.44 kg/m.  Wt Readings from Last 3 Encounters:  04/29/20 145 lb 3.2 oz (65.9 kg)  10/18/19 147 lb (66.7 kg)  10/03/19 146 lb (66.2 kg)    General: Elderly woman, appears comfortable, using a cane. HEENT: Conjunctiva and lids normal, wearing a mask. Neck: Supple, no elevated JVP or carotid bruits, no thyromegaly. Lungs: Clear to auscultation, nonlabored breathing at rest. Cardiac: Irregularly irregular, no gallop. Extremities: No pitting edema.  ECG:  An ECG dated 08/21/2018 was personally reviewed today and demonstrated:  Rate controlled atrial fibrillation with nonspecific ST-T changes.  Recent Labwork:  March 2021: BUN 13, creatinine 0.91, potassium 4.9, AST 43, ALT 20, hemoglobin 14.5, platelets 223, cholesterol 206, triglycerides 57, HDL 94, LDL 102  Other Studies Reviewed Today:  Echocardiogram 03/02/2013: Study Conclusions   - Left ventricle: The cavity size was normal. There was mild  focal basal hypertrophy with no obstruction. Systolic  function was normal. The estimated ejection fraction was  in the range of 60% to 65%. Wall motion was normal; there  were no regional wall motion abnormalities. The study is  not technically sufficient to allow evaluation of LV  diastolic function. There is evidence of elevated left  atrial pressure.  - Aortic valve: Valve area: 1.71cm^2(VTI).  - Left atrium: The atrium was mildly dilated.   Assessment and Plan:  1.  Permanent atrial fibrillation.  CHA2DS2-VASc score is at least 3.  We have continued Eliquis for stroke prophylaxis, tolerating without obvious bleeding problems.  I reviewed her lab work from earlier in the year.  She has follow-up with Dr. Willey Blade in January.  Continue Cardizem CD and Lopressor for heart rate control.  2.  Intermittent leg swelling, patient reports reasonable control with use of compression stockings and Lasix.  Medication  Adjustments/Labs and Tests Ordered: Current medicines are reviewed at length with the patient today.  Concerns regarding medicines are outlined above.   Tests Ordered: Orders Placed This Encounter  Procedures  . EKG 12-Lead    Medication Changes: Meds ordered this encounter  Medications  . apixaban (ELIQUIS) 5 MG TABS tablet    Sig: Take 1 tablet (5 mg total) by mouth 2 (two) times daily.    Dispense:  84 tablet    Refill:  0    Lot # [id number] Exp 01-2022    Disposition:  Follow up 6 months in the [place] office.  Signed, Satira Sark, MD, [facility] 04/29/2020 3:02 PM    [facility] at [facility] [address] [place], [place] [address] Phone: [phone]; Fax: [phone]

## 2020-04-29 ENCOUNTER — Ambulatory Visit: Payer: PPO | Admitting: Cardiology

## 2020-04-29 ENCOUNTER — Other Ambulatory Visit: Payer: Self-pay

## 2020-04-29 ENCOUNTER — Encounter: Payer: Self-pay | Admitting: Cardiology

## 2020-04-29 VITALS — BP 130/80 | HR 78 | Ht 61.0 in | Wt 145.2 lb

## 2020-04-29 DIAGNOSIS — M7989 Other specified soft tissue disorders: Secondary | ICD-10-CM

## 2020-04-29 DIAGNOSIS — I4821 Permanent atrial fibrillation: Secondary | ICD-10-CM

## 2020-04-29 MED ORDER — APIXABAN 5 MG PO TABS: 5.0000 mg | ORAL_TABLET | Freq: Two times a day (BID) | ORAL | 0 refills | Status: AC

## 2020-04-29 NOTE — Patient Instructions (Addendum)

## 2020-06-16 ENCOUNTER — Ambulatory Visit: Payer: PPO

## 2020-06-16 ENCOUNTER — Other Ambulatory Visit: Payer: Self-pay

## 2020-06-16 ENCOUNTER — Ambulatory Visit (INDEPENDENT_AMBULATORY_CARE_PROVIDER_SITE_OTHER): Payer: PPO | Admitting: Orthopedic Surgery

## 2020-06-16 DIAGNOSIS — G8929 Other chronic pain: Secondary | ICD-10-CM

## 2020-06-16 DIAGNOSIS — M25531 Pain in right wrist: Secondary | ICD-10-CM

## 2020-06-16 DIAGNOSIS — M170 Bilateral primary osteoarthritis of knee: Secondary | ICD-10-CM

## 2020-06-16 DIAGNOSIS — M654 Radial styloid tenosynovitis [de Quervain]: Secondary | ICD-10-CM | POA: Diagnosis not present

## 2020-06-16 NOTE — Patient Instructions (Signed)
Call for injections as needed  Wear wrist brace for 4 weeks  Apply ice to the wrist 3 times a day for 20 minutes  Apply BenGay ointment or similar cream to the wrist 3 times a day after icing

## 2020-06-16 NOTE — Progress Notes (Addendum)
FOLLOW-UP OFFICE VISIT    Assessment and plan  2 injections for primary arthritis and chronic pain both knees follow-up as needed call for appointment for next injection  dequervains right   Ice  BenGay or similar cream  Wrist brace 4 weeks   Encounter Diagnoses  Name Primary?  . Chronic pain of both knees Yes  . Primary osteoarthritis of both knees   . Pain in right wrist   . De Quervain's tenosynovitis, right     89 requests injections both knees    Procedure note for bilateral knee injections  Procedure note left knee injection verbal consent was obtained to inject left knee joint  Timeout was completed to confirm the site of injection  The medications used were 40 mg of Depo-Medrol and 1% lidocaine 3 cc  Anesthesia was provided by ethyl chloride and the skin was prepped with alcohol.  After cleaning the skin with alcohol a 20-gauge needle was used to inject the left knee joint. There were no complications. A sterile bandage was applied.   Procedure note right knee injection verbal consent was obtained to inject right knee joint  Timeout was completed to confirm the site of injection  The medications used were 40 mg of Depo-Medrol and 1% lidocaine 3 cc  Anesthesia was provided by ethyl chloride and the skin was prepped with alcohol.  After cleaning the skin with alcohol a 20-gauge needle was used to inject the right knee joint. There were no complications. A sterile bandage was applied.   New problem:  My right wrist hurts x 2 weeks   86 yo female with history of right wrist pain x 2 weeks    + EXAM FINDINGS:   Right Wrist  No gross deformity bruising or ecchymosis skin is intact painful passive range of motion along the volar aspect of the wrist with tenderness over the flexor carpi radialis tendon with no instability and normal grip strength no weakness on extension or flexion Normal sensation pulse perfusion and capillary refill  xrays : in the  office normal xrays wrist oa cmc joint     ASSESSMENT AND PLAN  Encounter Diagnoses  Name Primary?  . Chronic pain of both knees Yes  . Primary osteoarthritis of both knees   . Pain in right wrist   . De Quervain's tenosynovitis, right     Flexor carpi radialis tendinitis Ice BenGay or similar Brace for 4 weeks Follow-up as needed

## 2020-07-02 ENCOUNTER — Telehealth: Payer: Self-pay | Admitting: Orthopedic Surgery

## 2020-07-02 NOTE — Telephone Encounter (Signed)
Patient called and stated her knees were hurting.  She wanted to come in for shots in her knees.  I told her that she just got injections on December 27th.  She said she needed something to help with the pain and numbness in her knees.  She said she knows she needs surgery but is not a surgical candidate.  She said she is only taking Tylenol for the pain.  Would you call her when you get a few minutes to advise what her options are?  Thanks

## 2020-07-02 NOTE — Telephone Encounter (Signed)
If she has numbness that needs to be looked at Please make appointment for her, thanks

## 2020-07-02 NOTE — Telephone Encounter (Signed)
Patient called back.  Now Marcia Woods is stating it is not her knees that is hurting her or feeling numb, it is her thighs.  She said it just started hurting today.  She doesn't want to wait for an appointment with Dr. Aline Brochure.  She is going to call her PCP to schedule an appointment and will call us back for an appointment if it is ortho related

## 2020-07-29 ENCOUNTER — Encounter (INDEPENDENT_AMBULATORY_CARE_PROVIDER_SITE_OTHER): Payer: PPO | Admitting: Ophthalmology

## 2020-07-31 ENCOUNTER — Other Ambulatory Visit: Payer: Self-pay

## 2020-07-31 ENCOUNTER — Ambulatory Visit (HOSPITAL_COMMUNITY)
Admission: RE | Admit: 2020-07-31 | Discharge: 2020-07-31 | Disposition: A | Discharge status: Home / Self Care | Payer: PPO | Source: Ambulatory Visit | Attending: Internal Medicine | Admitting: Internal Medicine

## 2020-07-31 ENCOUNTER — Other Ambulatory Visit (HOSPITAL_COMMUNITY): Payer: Self-pay | Admitting: Internal Medicine

## 2020-07-31 DIAGNOSIS — M25531 Pain in right wrist: Secondary | ICD-10-CM | POA: Diagnosis not present

## 2020-07-31 DIAGNOSIS — M2550 Pain in unspecified joint: Secondary | ICD-10-CM | POA: Diagnosis not present

## 2020-09-12 DIAGNOSIS — R609 Edema, unspecified: Secondary | ICD-10-CM | POA: Diagnosis not present

## 2020-09-18 DIAGNOSIS — K219 Gastro-esophageal reflux disease without esophagitis: Secondary | ICD-10-CM | POA: Diagnosis not present

## 2020-09-18 DIAGNOSIS — I4821 Permanent atrial fibrillation: Secondary | ICD-10-CM | POA: Diagnosis not present

## 2020-09-18 DIAGNOSIS — E785 Hyperlipidemia, unspecified: Secondary | ICD-10-CM | POA: Diagnosis not present

## 2020-09-29 ENCOUNTER — Encounter (INDEPENDENT_AMBULATORY_CARE_PROVIDER_SITE_OTHER): Payer: PPO | Admitting: Ophthalmology

## 2020-10-18 DIAGNOSIS — I4821 Permanent atrial fibrillation: Secondary | ICD-10-CM | POA: Diagnosis not present

## 2020-10-18 DIAGNOSIS — E785 Hyperlipidemia, unspecified: Secondary | ICD-10-CM | POA: Diagnosis not present

## 2020-10-18 DIAGNOSIS — K219 Gastro-esophageal reflux disease without esophagitis: Secondary | ICD-10-CM | POA: Diagnosis not present

## 2020-10-27 DIAGNOSIS — I4891 Unspecified atrial fibrillation: Secondary | ICD-10-CM | POA: Diagnosis not present

## 2020-10-27 DIAGNOSIS — R000 Tachycardia, unspecified: Secondary | ICD-10-CM | POA: Diagnosis not present

## 2020-10-27 DIAGNOSIS — R110 Nausea: Secondary | ICD-10-CM | POA: Diagnosis not present

## 2020-10-31 ENCOUNTER — Ambulatory Visit: Payer: PPO | Admitting: Cardiology

## 2020-11-27 ENCOUNTER — Ambulatory Visit: Payer: PPO | Admitting: Cardiology

## 2020-11-27 ENCOUNTER — Encounter (INDEPENDENT_AMBULATORY_CARE_PROVIDER_SITE_OTHER): Payer: PPO | Admitting: Ophthalmology

## 2020-12-18 DIAGNOSIS — E785 Hyperlipidemia, unspecified: Secondary | ICD-10-CM | POA: Diagnosis not present

## 2020-12-18 DIAGNOSIS — E1129 Type 2 diabetes mellitus with other diabetic kidney complication: Secondary | ICD-10-CM | POA: Diagnosis not present

## 2020-12-23 ENCOUNTER — Other Ambulatory Visit: Payer: Self-pay | Admitting: Cardiology

## 2021-02-02 ENCOUNTER — Other Ambulatory Visit: Payer: Self-pay | Admitting: Cardiology

## 2021-03-20 DIAGNOSIS — I4821 Permanent atrial fibrillation: Secondary | ICD-10-CM | POA: Diagnosis not present

## 2021-03-20 DIAGNOSIS — K219 Gastro-esophageal reflux disease without esophagitis: Secondary | ICD-10-CM | POA: Diagnosis not present

## 2021-03-20 DIAGNOSIS — E785 Hyperlipidemia, unspecified: Secondary | ICD-10-CM | POA: Diagnosis not present

## 2021-03-23 ENCOUNTER — Ambulatory Visit: Payer: PPO | Admitting: Cardiology

## 2021-04-08 ENCOUNTER — Other Ambulatory Visit: Payer: Self-pay | Admitting: Cardiology

## 2021-05-23 ENCOUNTER — Other Ambulatory Visit: Payer: Self-pay | Admitting: Cardiology

## 2021-06-08 ENCOUNTER — Ambulatory Visit
Admission: EM | Admit: 2021-06-08 | Discharge: 2021-06-08 | Disposition: A | Discharge status: Home / Self Care | Payer: PPO | Attending: Urgent Care | Admitting: Urgent Care

## 2021-06-08 ENCOUNTER — Other Ambulatory Visit: Payer: Self-pay

## 2021-06-08 DIAGNOSIS — S6991XA Unspecified injury of right wrist, hand and finger(s), initial encounter: Secondary | ICD-10-CM | POA: Diagnosis not present

## 2021-06-08 DIAGNOSIS — M79641 Pain in right hand: Secondary | ICD-10-CM | POA: Diagnosis not present

## 2021-06-08 MED ORDER — CEPHALEXIN 250 MG PO CAPS: 250.0000 mg | ORAL_CAPSULE | Freq: Four times a day (QID) | ORAL | 0 refills | Status: DC

## 2021-06-08 NOTE — ED Provider Notes (Signed)
[name]   MRN: [id number] DOB: Feb 03, 1926  Subjective:   Marcia Woods is a 87 y.o. female presenting for 3-week history of persistent swelling over the right top of her hand.  Patient bumped it up against a door and since then has progressively gotten more swollen, and painful.  Patient has a history of atrial fibrillation and is on Eliquis long-term for this.  No fever, drainage of pus or active bleeding.  No current facility-administered medications for this encounter.  Current Outpatient Medications:    ALPRAZolam (XANAX) 0.25 MG tablet, Take 1 tablet by mouth daily as needed., Disp: , Rfl:    apixaban (ELIQUIS) 5 MG TABS tablet, Take 1 tablet (5 mg total) by mouth 2 (two) times daily., Disp: 84 tablet, Rfl: 0   cholecalciferol (VITAMIN D) 1000 UNITS tablet, Take 1,000 Units by mouth daily. , Disp: , Rfl:    diltiazem (CARDIZEM CD) 240 MG 24 hr capsule, TAKE (1) CAPSULE BY MOUTH ONCE DAILY., Disp: 30 capsule, Rfl: 0   furosemide (LASIX) 20 MG tablet, TAKE (1) TABLET BY MOUTH ONCE DAILY., Disp: 30 tablet, Rfl: 0   metoprolol tartrate (LOPRESSOR) 50 MG tablet, TAKE 1 AND 1/2 TABLETS BY MOUTH TWICE A DAY., Disp: 270 tablet, Rfl: 0   pantoprazole (PROTONIX) 40 MG tablet, Take 40 mg by mouth every morning., Disp: , Rfl:    pravastatin (PRAVACHOL) 40 MG tablet, Take 40 mg by mouth at bedtime., Disp: , Rfl:    Allergies  Allergen Reactions   Codeine Nausea And Vomiting    Past Medical History:  Diagnosis Date   Ankle fracture 05/22/2013   Anxiety    Arthritis    Atrial fibrillation ([place])    Constipation    Dermatitis of lower extremity 11/18/2015   GERD (gastroesophageal reflux disease)    Hyperlipemia    Macular degeneration    Nodule of left lung    PSVT (paroxysmal supraventricular tachycardia) ([place]) 03/05/2013   Vaginal atrophy      Past Surgical History:  Procedure Laterality Date   ABDOMINAL HYSTERECTOMY     BRONCHIAL BIOPSY N/A 06/11/2013    Procedure: TRANSBRONCHIAL BIOPSIES;  Surgeon: Grace Isaac, MD;  Location: [place];  Service: Thoracic;  Laterality: N/A;   Cataract surgery     bilaterally   [place]  01/26/2012   Procedure: LAPAROSCOPIC CHOLECYSTECTOMY;  Surgeon: Jamesetta So, MD;  [facility];  Service: General;  Laterality: N/A;   COLONOSCOPY  04/30/2011   Procedure: COLONOSCOPY;  Surgeon: Rogene Houston, MD;  [facility];  Service: Endoscopy;  Laterality: N/A;  8:30 am   ENDOBRONCHIAL ULTRASOUND N/A 06/11/2013   Procedure: ENDOBRONCHIAL ULTRASOUND;  Surgeon: Grace Isaac, MD;  Location: [place];  Service: Thoracic;  Laterality: N/A;   ESOPHAGOGASTRODUODENOSCOPY  05/27/2011   Procedure: ESOPHAGOGASTRODUODENOSCOPY (EGD);  Surgeon: Rogene Houston, MD;  [facility];  Service: Endoscopy;  Laterality: N/A;  300   HEMORRHOID SURGERY     KNEE ARTHROSCOPY  2010   left-[facility]   ORIF WRIST FRACTURE Left 12/04/2014   Procedure: OPEN REDUCTION INTERNAL FIXATION LEFT WRIST FRACTURE;  Surgeon: Carole Civil, MD;  [facility];  Service: Orthopedics;  Laterality: Left;   TRIGGER FINGER RELEASE     right   VIDEO BRONCHOSCOPY WITH ENDOBRONCHIAL NAVIGATION N/A 06/11/2013   Procedure: VIDEO BRONCHOSCOPY WITH ENDOBRONCHIAL NAVIGATION;  Surgeon: Grace Isaac, MD;  Location: [place];  Service: Thoracic;  Laterality: N/A;    Family History  Problem Relation Age of Onset   Heart disease Mother    Heart disease Father    Cancer Son        prostate   Cancer Son        throat   Colon cancer Neg Hx     Social History   Tobacco Use   Smoking status: Never   Smokeless tobacco: Never  Vaping Use   Vaping Use: Never used  Substance Use Topics   Alcohol use: No    Alcohol/week: 0.0 standard drinks   Drug use: No    ROS   Objective:   Vitals: BP 137/85    Pulse 93    Temp 98.5 F (36.9 C)    Resp 19    SpO2 95%   Physical  Exam Constitutional:      General: She is not in acute distress.    Appearance: Normal appearance. She is well-developed. She is not ill-appearing, toxic-appearing or diaphoretic.  HENT:     Head: Normocephalic and atraumatic.     Nose: Nose normal.     Mouth/Throat:     Mouth: Mucous membranes are moist.     Pharynx: Oropharynx is clear.  Eyes:     General: No scleral icterus.    Extraocular Movements: Extraocular movements intact.     Pupils: Pupils are equal, round, and reactive to light.  Cardiovascular:     Rate and Rhythm: Normal rate.  Pulmonary:     Effort: Pulmonary effort is normal.  Musculoskeletal:       Hands:  Skin:    General: Skin is warm and dry.  Neurological:     General: No focal deficit present.     Mental Status: She is alert and oriented to person, place, and time.  Psychiatric:        Mood and Affect: Mood normal.        Behavior: Behavior normal.         Assessment and Plan :   PDMP not reviewed this encounter.  1. Right hand pain   2. Hand injury, right, initial encounter    Suspect initially that patient had a hematoma develop is been difficult to clear.  We will address infectious process with Keflex, emphasized need for follow-up with hand specialist for definitive treatment including procedural removal or incision and drainage as necessary. Counseled patient on potential for adverse effects with medications prescribed/recommended today, ER and return-to-clinic precautions discussed, patient verbalized understanding.    Jaynee Eagles, PA-C 06/08/21 1521

## 2021-06-08 NOTE — ED Triage Notes (Signed)
Pt presents with a wound to her right hand that came from bumping her hand into a door 2-3 weeks ago.

## 2021-06-10 DIAGNOSIS — M79641 Pain in right hand: Secondary | ICD-10-CM | POA: Diagnosis not present

## 2021-06-10 DIAGNOSIS — S60221A Contusion of right hand, initial encounter: Secondary | ICD-10-CM | POA: Diagnosis not present

## 2021-10-07 ENCOUNTER — Encounter (HOSPITAL_COMMUNITY): Payer: Self-pay

## 2021-10-07 ENCOUNTER — Inpatient Hospital Stay (HOSPITAL_COMMUNITY)
Admission: EM | Admit: 2021-10-07 | Discharge: 2021-10-13 | DRG: 536 | Disposition: A | Discharge status: Skilled Nursing Facility | Payer: PPO | Attending: Internal Medicine | Admitting: Internal Medicine

## 2021-10-07 ENCOUNTER — Emergency Department (HOSPITAL_COMMUNITY): Payer: PPO

## 2021-10-07 ENCOUNTER — Other Ambulatory Visit: Payer: Self-pay

## 2021-10-07 DIAGNOSIS — Y92009 Unspecified place in unspecified non-institutional (private) residence as the place of occurrence of the external cause: Secondary | ICD-10-CM

## 2021-10-07 DIAGNOSIS — I4821 Permanent atrial fibrillation: Secondary | ICD-10-CM

## 2021-10-07 DIAGNOSIS — S32511A Fracture of superior rim of right pubis, initial encounter for closed fracture: Secondary | ICD-10-CM | POA: Diagnosis not present

## 2021-10-07 DIAGNOSIS — F419 Anxiety disorder, unspecified: Secondary | ICD-10-CM | POA: Diagnosis not present

## 2021-10-07 DIAGNOSIS — S32591D Other specified fracture of right pubis, subsequent encounter for fracture with routine healing: Secondary | ICD-10-CM | POA: Diagnosis not present

## 2021-10-07 DIAGNOSIS — Z79899 Other long term (current) drug therapy: Secondary | ICD-10-CM | POA: Diagnosis not present

## 2021-10-07 DIAGNOSIS — S329XXA Fracture of unspecified parts of lumbosacral spine and pelvis, initial encounter for closed fracture: Secondary | ICD-10-CM | POA: Diagnosis present

## 2021-10-07 DIAGNOSIS — E785 Hyperlipidemia, unspecified: Secondary | ICD-10-CM | POA: Diagnosis present

## 2021-10-07 DIAGNOSIS — Z7901 Long term (current) use of anticoagulants: Secondary | ICD-10-CM

## 2021-10-07 DIAGNOSIS — K219 Gastro-esophageal reflux disease without esophagitis: Secondary | ICD-10-CM | POA: Diagnosis present

## 2021-10-07 DIAGNOSIS — M160 Bilateral primary osteoarthritis of hip: Secondary | ICD-10-CM | POA: Diagnosis not present

## 2021-10-07 DIAGNOSIS — W1830XA Fall on same level, unspecified, initial encounter: Secondary | ICD-10-CM | POA: Diagnosis present

## 2021-10-07 DIAGNOSIS — W19XXXA Unspecified fall, initial encounter: Secondary | ICD-10-CM | POA: Diagnosis present

## 2021-10-07 DIAGNOSIS — M4319 Spondylolisthesis, multiple sites in spine: Secondary | ICD-10-CM | POA: Diagnosis not present

## 2021-10-07 DIAGNOSIS — G319 Degenerative disease of nervous system, unspecified: Secondary | ICD-10-CM | POA: Diagnosis not present

## 2021-10-07 DIAGNOSIS — R911 Solitary pulmonary nodule: Secondary | ICD-10-CM | POA: Diagnosis not present

## 2021-10-07 DIAGNOSIS — S32591A Other specified fracture of right pubis, initial encounter for closed fracture: Principal | ICD-10-CM | POA: Diagnosis present

## 2021-10-07 DIAGNOSIS — I4891 Unspecified atrial fibrillation: Secondary | ICD-10-CM | POA: Diagnosis present

## 2021-10-07 DIAGNOSIS — M158 Other polyosteoarthritis: Secondary | ICD-10-CM | POA: Diagnosis not present

## 2021-10-07 DIAGNOSIS — Z043 Encounter for examination and observation following other accident: Secondary | ICD-10-CM | POA: Diagnosis not present

## 2021-10-07 DIAGNOSIS — Z885 Allergy status to narcotic agent status: Secondary | ICD-10-CM | POA: Diagnosis not present

## 2021-10-07 DIAGNOSIS — M6281 Muscle weakness (generalized): Secondary | ICD-10-CM | POA: Diagnosis not present

## 2021-10-07 DIAGNOSIS — I10 Essential (primary) hypertension: Secondary | ICD-10-CM | POA: Diagnosis not present

## 2021-10-07 DIAGNOSIS — L309 Dermatitis, unspecified: Secondary | ICD-10-CM | POA: Diagnosis present

## 2021-10-07 DIAGNOSIS — I517 Cardiomegaly: Secondary | ICD-10-CM | POA: Diagnosis not present

## 2021-10-07 DIAGNOSIS — S0990XA Unspecified injury of head, initial encounter: Secondary | ICD-10-CM | POA: Diagnosis not present

## 2021-10-07 DIAGNOSIS — J329 Chronic sinusitis, unspecified: Secondary | ICD-10-CM | POA: Diagnosis not present

## 2021-10-07 DIAGNOSIS — F0390 Unspecified dementia without behavioral disturbance: Secondary | ICD-10-CM

## 2021-10-07 DIAGNOSIS — I6782 Cerebral ischemia: Secondary | ICD-10-CM | POA: Diagnosis not present

## 2021-10-07 DIAGNOSIS — R000 Tachycardia, unspecified: Secondary | ICD-10-CM | POA: Diagnosis not present

## 2021-10-07 DIAGNOSIS — R269 Unspecified abnormalities of gait and mobility: Secondary | ICD-10-CM | POA: Diagnosis not present

## 2021-10-07 DIAGNOSIS — Z66 Do not resuscitate: Secondary | ICD-10-CM | POA: Diagnosis not present

## 2021-10-07 DIAGNOSIS — H3530 Unspecified macular degeneration: Secondary | ICD-10-CM | POA: Diagnosis not present

## 2021-10-07 DIAGNOSIS — Z9049 Acquired absence of other specified parts of digestive tract: Secondary | ICD-10-CM | POA: Diagnosis not present

## 2021-10-07 DIAGNOSIS — S32509A Unspecified fracture of unspecified pubis, initial encounter for closed fracture: Secondary | ICD-10-CM | POA: Diagnosis present

## 2021-10-07 DIAGNOSIS — M47812 Spondylosis without myelopathy or radiculopathy, cervical region: Secondary | ICD-10-CM | POA: Diagnosis not present

## 2021-10-07 DIAGNOSIS — R52 Pain, unspecified: Secondary | ICD-10-CM | POA: Diagnosis not present

## 2021-10-07 DIAGNOSIS — J984 Other disorders of lung: Secondary | ICD-10-CM | POA: Diagnosis not present

## 2021-10-07 DIAGNOSIS — M25551 Pain in right hip: Secondary | ICD-10-CM | POA: Diagnosis not present

## 2021-10-07 DIAGNOSIS — S32501A Unspecified fracture of right pubis, initial encounter for closed fracture: Secondary | ICD-10-CM

## 2021-10-07 DIAGNOSIS — S32401D Unspecified fracture of right acetabulum, subsequent encounter for fracture with routine healing: Secondary | ICD-10-CM | POA: Diagnosis not present

## 2021-10-07 DIAGNOSIS — S72001A Fracture of unspecified part of neck of right femur, initial encounter for closed fracture: Secondary | ICD-10-CM | POA: Diagnosis not present

## 2021-10-07 DIAGNOSIS — M25572 Pain in left ankle and joints of left foot: Secondary | ICD-10-CM | POA: Diagnosis not present

## 2021-10-07 DIAGNOSIS — Z9181 History of falling: Secondary | ICD-10-CM | POA: Diagnosis not present

## 2021-10-07 DIAGNOSIS — K4090 Unilateral inguinal hernia, without obstruction or gangrene, not specified as recurrent: Secondary | ICD-10-CM | POA: Diagnosis not present

## 2021-10-07 DIAGNOSIS — K5730 Diverticulosis of large intestine without perforation or abscess without bleeding: Secondary | ICD-10-CM | POA: Diagnosis not present

## 2021-10-07 DIAGNOSIS — S199XXA Unspecified injury of neck, initial encounter: Secondary | ICD-10-CM | POA: Diagnosis not present

## 2021-10-07 DIAGNOSIS — R2689 Other abnormalities of gait and mobility: Secondary | ICD-10-CM | POA: Diagnosis not present

## 2021-10-07 LAB — PROTIME-INR
INR: 1.5 — ABNORMAL HIGH (ref 0.8–1.2)
Prothrombin Time: 18 seconds — ABNORMAL HIGH (ref 11.4–15.2)

## 2021-10-07 LAB — BASIC METABOLIC PANEL
Anion gap: 11 (ref 5–15)
BUN: 15 mg/dL (ref 8–23)
CO2: 22 mmol/L (ref 22–32)
Calcium: 9.3 mg/dL (ref 8.9–10.3)
Chloride: 104 mmol/L (ref 98–111)
Creatinine, Ser: 0.77 mg/dL (ref 0.44–1.00)
GFR, Estimated: >60 mL/min (ref >60)
Glucose, Bld: 102 mg/dL — ABNORMAL HIGH (ref 70–99)
Potassium: 4.1 mmol/L (ref 3.5–5.1)
Sodium: 137 mmol/L (ref 135–145)

## 2021-10-07 LAB — CBC WITH DIFFERENTIAL/PLATELET
Abs Immature Granulocytes: 0.05 10*3/uL (ref 0.00–0.07)
Basophils Absolute: 0 10*3/uL (ref 0.0–0.1)
Basophils Relative: 0 %
Eosinophils Absolute: 0 10*3/uL (ref 0.0–0.5)
Eosinophils Relative: 0 %
HCT: 42 % (ref 36.0–46.0)
Hemoglobin: 13 g/dL (ref 12.0–15.0)
Immature Granulocytes: 1 %
Lymphocytes Relative: 8 %
Lymphs Abs: 0.6 10*3/uL — ABNORMAL LOW (ref 0.7–4.0)
MCH: 28.9 pg (ref 26.0–34.0)
MCHC: 31 g/dL (ref 30.0–36.0)
MCV: 93.3 fL (ref 80.0–100.0)
Monocytes Absolute: 0.6 10*3/uL (ref 0.1–1.0)
Monocytes Relative: 8 %
Neutro Abs: 6.2 10*3/uL (ref 1.7–7.7)
Neutrophils Relative %: 83 %
Platelets: 173 10*3/uL (ref 150–400)
RBC: 4.5 MIL/uL (ref 3.87–5.11)
RDW: 13.8 % (ref 11.5–15.5)
WBC: 7.4 10*3/uL (ref 4.0–10.5)
nRBC: 0 % (ref 0.0–0.2)

## 2021-10-07 LAB — TYPE AND SCREEN
ABO/RH(D): O POS
Antibody Screen: NEGATIVE

## 2021-10-07 LAB — MAGNESIUM: Magnesium: 2 mg/dL (ref 1.7–2.4)

## 2021-10-07 MED ORDER — ACETAMINOPHEN 650 MG RE SUPP: 650.0000 mg | Freq: Four times a day (QID) | RECTAL | Status: DC | PRN

## 2021-10-07 MED ORDER — METOPROLOL TARTRATE 50 MG PO TABS
50.0000 mg | ORAL_TABLET | Freq: Two times a day (BID) | ORAL | Status: DC
  Administered 2021-10-07 – 2021-10-09 (×4): 50 mg via ORAL
  Filled 2021-10-07 (×4): qty 1

## 2021-10-07 MED ORDER — APIXABAN 5 MG PO TABS
5.0000 mg | ORAL_TABLET | Freq: Two times a day (BID) | ORAL | Status: DC
  Administered 2021-10-07 – 2021-10-13 (×12): 5 mg via ORAL
  Filled 2021-10-07 (×12): qty 1

## 2021-10-07 MED ORDER — METOPROLOL TARTRATE 5 MG/5ML IV SOLN
5.0000 mg | INTRAVENOUS | Status: AC | PRN
  Administered 2021-10-07 – 2021-10-08 (×2): 5 mg via INTRAVENOUS
  Filled 2021-10-07 (×2): qty 5

## 2021-10-07 MED ORDER — HYDROCODONE-ACETAMINOPHEN 5-325 MG PO TABS
1.0000 | ORAL_TABLET | Freq: Four times a day (QID) | ORAL | Status: DC | PRN
  Administered 2021-10-08 – 2021-10-13 (×12): 1 via ORAL
  Filled 2021-10-07 (×13): qty 1

## 2021-10-07 MED ORDER — SODIUM CHLORIDE 0.9 % IV SOLN: INTRAVENOUS | Status: DC

## 2021-10-07 MED ORDER — ONDANSETRON HCL 4 MG/2ML IJ SOLN: 4.0000 mg | Freq: Four times a day (QID) | INTRAMUSCULAR | Status: DC | PRN

## 2021-10-07 MED ORDER — ONDANSETRON HCL 4 MG PO TABS: 4.0000 mg | ORAL_TABLET | Freq: Four times a day (QID) | ORAL | Status: DC | PRN

## 2021-10-07 MED ORDER — ACETAMINOPHEN 325 MG PO TABS
650.0000 mg | ORAL_TABLET | Freq: Four times a day (QID) | ORAL | Status: DC | PRN
Start: 2021-10-07 — End: 2021-10-13

## 2021-10-07 MED ORDER — POLYETHYLENE GLYCOL 3350 17 G PO PACK: 17.0000 g | PACK | Freq: Every day | ORAL | Status: DC | PRN

## 2021-10-07 MED ORDER — FENTANYL CITRATE PF 50 MCG/ML IJ SOSY: 25.0000 ug | PREFILLED_SYRINGE | Freq: Once | INTRAMUSCULAR | Status: DC

## 2021-10-07 NOTE — Assessment & Plan Note (Signed)
Mechanical fall with subsequent pubic bone fracture. ?-CT pelvis shows mild displaced fracture of right inferior pubic ramus and right puboacetabular junction. ?- EDP talked with Dr. Regino Bellow, weightbearing as tolerated, pain control ?-Hydrocodone/acetaminophen 10/22/2023 every 6 hourly as needed ?-PT evaluation ?

## 2021-10-07 NOTE — ED Provider Notes (Signed)
?[facility] ?Provider Note ? ? ?CSN: [id number] ?Arrival date & time: 10/07/21  1351 ? ?  ? ?History ? ?Chief Complaint  ?Patient presents with  ? Fall  ? ? ?Marcia Woods is a 88 y.o. female with a past medical history of A-fib, anticoagulated with Eliquis, who presents today for evaluation after a fall.  She denies any loss of consciousness however is not otherwise sure what made her fall.  She denies feeling poorly before hand.  She states that her family heard her fall and she was not on the ground for an extended period of time.  She did strike her head.  Her last dose of Eliquis was last night.  She denies any recent fevers or illness. ?She reports she struck the back right side of her head. ?She reports pain in her right hip since the fall.  She denies any other injuries from the fall. ? ?Of note she has codeine listed as an allergy. ?Chart review shows that she has gotten fentanyl previously which appears to have been well-tolerated.  Her allergy is nausea and vomiting. ? ?HPI ? ?  ? ?Home Medications ?Prior to Admission medications   ?Medication Sig Start Date End Date Taking? Authorizing Provider  ?apixaban (ELIQUIS) 5 MG TABS tablet Take 1 tablet (5 mg total) by mouth 2 (two) times daily. 04/29/20  Yes Satira Sark, MD  ?furosemide (LASIX) 20 MG tablet TAKE (1) TABLET BY MOUTH ONCE DAILY. ?Patient taking differently: Take 20 mg by mouth daily as needed for fluid. 05/25/21  Yes Satira Sark, MD  ?metoprolol tartrate (LOPRESSOR) 50 MG tablet TAKE 1 AND 1/2 TABLETS BY MOUTH TWICE A DAY. ?Patient taking differently: Take 50 mg by mouth 2 (two) times daily. 12/23/20  Yes Satira Sark, MD  ?cephALEXin (KEFLEX) 250 MG capsule Take 1 capsule (250 mg total) by mouth 4 (four) times daily. ?Patient not taking: Reported on 10/07/2021 06/08/21   Jaynee Eagles, PA-C  ?diltiazem (CARDIZEM CD) 240 MG 24 hr capsule TAKE (1) CAPSULE BY MOUTH ONCE DAILY. ?Patient not taking: Reported on  10/07/2021 02/02/21   Satira Sark, MD  ?   ? ?Allergies    ?Codeine   ? ?Review of Systems   ?Review of Systems ? ?Physical Exam ?Updated Vital Signs ?BP (!) 132/93   Pulse 94   Temp 98.2 ?F (36.8 ?C)   Resp 16   Ht '5\' 1"'$  (1.549 m)   Wt 65.8 kg   SpO2 97%   BMI 27.40 kg/m?  ?Physical Exam ?Vitals and nursing note reviewed.  ?Constitutional:   ?   General: She is not in acute distress. ?   Appearance: Normal appearance. She is not ill-appearing.  ?HENT:  ?   Head: Normocephalic.  ?   Comments: Mild tenderness to palpation over the right occiput.  There is no obvious deformities.  No raccoon's eyes or battle signs bilaterally. ?Eyes:  ?   Conjunctiva/sclera: Conjunctivae normal.  ?Cardiovascular:  ?   Rate and Rhythm: Tachycardia present. Rhythm irregular.  ?   Pulses: Normal pulses.  ?   Heart sounds: Normal heart sounds.  ?Pulmonary:  ?   Effort: Pulmonary effort is normal. No respiratory distress.  ?   Breath sounds: Normal breath sounds.  ?Abdominal:  ?   General: There is no distension.  ?Musculoskeletal:  ?   Cervical back: Normal range of motion and neck supple.  ?   Comments: Right leg is shorter than left.  Compartments in bilateral arms and legs are soft and easily compressible.  There is no pain or tenderness to palpation in right lower leg, around the knee.  There is pain in the right hip.  Left hip is without pain with movement.  ?Lymphadenopathy:  ?   Cervical: No cervical adenopathy.  ?Skin: ?   General: Skin is warm.  ?Neurological:  ?   Mental Status: She is alert.  ?   Sensory: No sensory deficit.  ?   Motor: No weakness.  ?   Comments: Awake and alert, answers all questions appropriately.  Speech is not slurred.   ?Psychiatric:     ?   Mood and Affect: Mood normal.     ?   Behavior: Behavior normal.  ? ? ?ED Results / Procedures / Treatments   ?Labs ?(all labs ordered are listed, but only abnormal results are displayed) ?Labs Reviewed  ?BASIC METABOLIC PANEL - Abnormal; Notable for the  following components:  ?    Result Value  ? Glucose, Bld 102 (*)   ? All other components within normal limits  ?CBC WITH DIFFERENTIAL/PLATELET - Abnormal; Notable for the following components:  ? Lymphs Abs 0.6 (*)   ? All other components within normal limits  ?PROTIME-INR - Abnormal; Notable for the following components:  ? Prothrombin Time 18.0 (*)   ? INR 1.5 (*)   ? All other components within normal limits  ?MAGNESIUM  ?TYPE AND SCREEN  ? ? ?EKG ?EKG Interpretation ? ?Date/Time:  Wednesday October 07 2021 14:16:02 EDT ?Ventricular Rate:  131 ?PR Interval:    ?QRS Duration: 92 ?QT Interval:  321 ?QTC Calculation: 474 ?R Axis:   66 ?Text Interpretation: Atrial fibrillation Low voltage, precordial leads Anteroseptal infarct, old ST depression, probably rate related Confirmed by Noemi Chapel [phone]) on 10/07/2021 2:24:58 PM ? ?Radiology ?CT Head Wo Contrast ? ?Result Date: 10/07/2021 ?CLINICAL DATA:  Head trauma, Minor. Anticoagulated with Eliquis. Additional history provided: Fall (hitting back of head). Neck trauma. EXAM: CT HEAD WITHOUT CONTRAST CT CERVICAL SPINE WITHOUT CONTRAST TECHNIQUE: Multidetector CT imaging of the head and cervical spine was performed following the standard protocol without intravenous contrast. Multiplanar CT image reconstructions of the cervical spine were also generated. RADIATION DOSE REDUCTION: This exam was performed according to the departmental dose-optimization program which includes automated exposure control, adjustment of the mA and/or kV according to patient size and/or use of iterative reconstruction technique. COMPARISON:  Prior head CT examinations 09/03/2017 and earlier. Report from radiographs of the cervical spine 04/27/2001 (images unavailable). FINDINGS: CT HEAD FINDINGS Brain: Generalized cerebral atrophy. Commensurate prominence of the ventricles and sulci. Moderate to advanced patchy and confluent hypoattenuation within the cerebral white matter, nonspecific but  compatible with chronic small vessel ischemic disease. There is no acute intracranial hemorrhage. No demarcated cortical infarct. No extra-axial fluid collection. No evidence of an intracranial mass. No midline shift. Vascular: No hyperdense vessel.  Atherosclerotic calcifications. Skull: Normal. Negative for fracture or focal lesion. Sinuses/Orbits: Visualized orbits show no acute finding. Mild-to-moderate mucosal thickening within the inferior right maxillary sinus. Moderate-sized fluid level, and background mild mucosal thickening, within the imaged left maxillary sinus. Moderate mucosal thickening within the bilateral ethmoid sinuses. Mild mucosal thickening within the frontoethmoidal recesses, bilaterally. CT CERVICAL SPINE FINDINGS Alignment: Trace C5-C6 and T1-T2 grade 1 anterolisthesis. Skull base and vertebrae: The basion-dental and atlanto-dental intervals are maintained.No evidence of acute fracture to the cervical spine. Soft tissues and spinal canal: No prevertebral fluid or swelling. No  visible canal hematoma. Disc levels: Prior C6-C7 ACDF.  No evidence of acute hardware compromise. Cervical spondylosis. No more than mild disc space narrowing. Multilevel disc bulges/central disc protrusions, facet arthrosis and ligamentum flavum hypertrophy. No appreciable high-grade spinal canal stenosis. No high-grade bony neural foraminal narrowing. Upper chest: No consolidation within the imaged lung apices. Biapical pleuroparenchymal scarring. Ill-defined opacity within the left upper lobe, minimally included in the field of view. IMPRESSION: CT head: 1. No evidence of acute intracranial abnormality. 2. Moderate to advanced chronic small vessel ischemic changes within the cerebral white matter. 3. Generalized cerebral atrophy. 4. Paranasal sinus disease, as described. Correlate for acute sinusitis. CT cervical spine: 1. No evidence of acute fracture to the cervical spine. 2. Trace C5-C6 and T1-T2 grade 1  anterolisthesis. 3. Prior C6-C7 ACDF.  No evidence of acute hardware compromise. 4. Cervical spondylosis, as described. 5. Ill-defined opacity within left upper lobe, only minimally included in the field of view. A chest ra

## 2021-10-07 NOTE — Assessment & Plan Note (Addendum)
Fall at home while using her walker.  She reports last fall was months ago.  She denies frequent falls.  Head CT and cervical without acute abnormality. ?-PT evaluation>>SNF ?

## 2021-10-07 NOTE — H&P (Addendum)
History and Physical    Marcia Woods ZOX:[id number] DOB: 05/15/1926 DOA: 10/07/2021  PCP: Carylon Perches, MD   Patient coming from: Home  I have personally briefly reviewed patient's old medical records in [facility]  Chief Complaint: Fall  HPI: Marcia Woods is a 88 y.o. female with medical history significant for atrial fibrillation on chronic anticoagulation with Eliquis. Patient was brought to the ED via EMS with reports of a fall with complaints of right hip pain.  Patient lives alone, family check on her daily.  Patient reports she does not know why she fell.  She did not lose consciousness.  But she did hit her head.  She reports she was walking with her walker which she normally uses when suddenly her legs gave out and she was on the floor.  2 family members were already on the way to check on patient as they normally do patient had not been down for long. She denies chest pain, no difficulty breathing, no palpitations.  ED Course: Temperature 98.4.  Heart rate 1 teens to 130s.  Respiratory rate 12-22.  Blood pressure systolic 140s to 811B.  O2 sats greater than 93% on room air.  Pelvic CT-mild displaced fractures of the right inferior pubic ramus and right puboacetabular junction. EDP talked with Dr. Dallas Schimke, recommended pain control, weightbearing as tolerated. Patient lives alone, hence admission was requested.  Review of Systems: As per HPI all other systems reviewed and negative.  Past Medical History:  Diagnosis Date   Ankle fracture 05/22/2013   Anxiety    Arthritis    Atrial fibrillation (HCC)    Constipation    Dermatitis of lower extremity 11/18/2015   GERD (gastroesophageal reflux disease)    Hyperlipemia    Macular degeneration    Nodule of left lung    PSVT (paroxysmal supraventricular tachycardia) (HCC) 03/05/2013   Vaginal atrophy     Past Surgical History:  Procedure Laterality Date   ABDOMINAL HYSTERECTOMY     BRONCHIAL BIOPSY N/A 06/11/2013    Procedure: TRANSBRONCHIAL BIOPSIES;  Surgeon: Delight Ovens, MD;  Location: [facility] OR;  Service: Thoracic;  Laterality: N/A;   Cataract surgery     bilaterally   CERVICAL DISC SURGERY     CHOLECYSTECTOMY  01/26/2012   Procedure: LAPAROSCOPIC CHOLECYSTECTOMY;  Surgeon: [name], MD;  Location: [facility] ORS;  Service: General;  Laterality: N/A;   COLONOSCOPY  04/30/2011   Procedure: COLONOSCOPY;  Surgeon: Malissa Hippo, MD;  Location: [facility] ENDO SUITE;  Service: Endoscopy;  Laterality: N/A;  8:30 am   ENDOBRONCHIAL ULTRASOUND N/A 06/11/2013   Procedure: ENDOBRONCHIAL ULTRASOUND;  Surgeon: Delight Ovens, MD;  Location: [facility] OR;  Service: Thoracic;  Laterality: N/A;   ESOPHAGOGASTRODUODENOSCOPY  05/27/2011   Procedure: ESOPHAGOGASTRODUODENOSCOPY (EGD);  Surgeon: Malissa Hippo, MD;  Location: [facility];  Service: Endoscopy;  Laterality: N/A;  300   HEMORRHOID SURGERY     KNEE ARTHROSCOPY  2010   left-[facility]   ORIF WRIST FRACTURE Left 12/04/2014   Procedure: OPEN REDUCTION INTERNAL FIXATION LEFT WRIST FRACTURE;  Surgeon: Vickki Hearing, MD;  Location: [facility] ORS;  Service: Orthopedics;  Laterality: Left;   TRIGGER FINGER RELEASE     right   VIDEO BRONCHOSCOPY WITH ENDOBRONCHIAL NAVIGATION N/A 06/11/2013   Procedure: VIDEO BRONCHOSCOPY WITH ENDOBRONCHIAL NAVIGATION;  Surgeon: Delight Ovens, MD;  Location: [facility] OR;  Service: Thoracic;  Laterality: N/A;     reports that she has never smoked. She has never  used smokeless tobacco. She reports that she does not drink alcohol and does not use drugs.  Allergies  Allergen Reactions   Codeine Nausea And Vomiting    Family History  Problem Relation Age of Onset   Heart disease Mother    Heart disease Father    Cancer Son        prostate   Cancer Son        throat   Colon cancer Neg Hx    Prior to Admission medications   Medication Sig Start Date End Date Taking? Authorizing Provider  apixaban (ELIQUIS) 5 MG TABS tablet Take 1  tablet (5 mg total) by mouth 2 (two) times daily. 04/29/20  Yes Jonelle Sidle, MD  furosemide (LASIX) 20 MG tablet TAKE (1) TABLET BY MOUTH ONCE DAILY. Patient taking differently: Take 20 mg by mouth daily as needed for fluid. 05/25/21  Yes Jonelle Sidle, MD  metoprolol tartrate (LOPRESSOR) 50 MG tablet TAKE 1 AND 1/2 TABLETS BY MOUTH TWICE A DAY. Patient taking differently: Take 50 mg by mouth 2 (two) times daily. 12/23/20  Yes Jonelle Sidle, MD  cephALEXin (KEFLEX) 250 MG capsule Take 1 capsule (250 mg total) by mouth 4 (four) times daily. Patient not taking: Reported on 10/07/2021 06/08/21   Wallis Bamberg, PA-C  diltiazem (CARDIZEM CD) 240 MG 24 hr capsule TAKE (1) CAPSULE BY MOUTH ONCE DAILY. Patient not taking: Reported on 10/07/2021 02/02/21   Jonelle Sidle, MD    Physical Exam: Vitals:   10/07/21 1730 10/07/21 1800 10/07/21 1830 10/07/21 1900  BP: (!) 145/117 (!) 147/95 (!) 145/103 (!) 143/83  Pulse:    (!) 114  Resp: 19 18 11 17   Temp:      TempSrc:      SpO2: 97% 96% 93% 94%  Weight:      Height:        Constitutional: NAD, calm, comfortable Vitals:   10/07/21 1730 10/07/21 1800 10/07/21 1830 10/07/21 1900  BP: (!) 145/117 (!) 147/95 (!) 145/103 (!) 143/83  Pulse:    (!) 114  Resp: 19 18 11 17   Temp:      TempSrc:      SpO2: 97% 96% 93% 94%  Weight:      Height:       Eyes: PERRL, lids and conjunctivae normal ENMT: Mucous membranes are moist.  Neck: normal, supple, no masses, no thyromegaly Respiratory: clear to auscultation bilaterally, no wheezing, no crackles. Normal respiratory effort. No accessory muscle use.  Cardiovascular: Regular rate and rhythm, no murmurs / rubs / gallops. No extremity edema.  Abdomen: no tenderness, no masses palpated. No hepatosplenomegaly. Bowel sounds positive.  Musculoskeletal: no clubbing / cyanosis. No joint deformity upper and lower extremities. Good ROM, no contractures. Normal muscle tone.  Skin: no rashes, lesions,  ulcers. No induration Neurologic: No apparent cranial nerve abnormality, moving extremities spontaneously.Marland Kitchen  Psychiatric: Normal judgment and insight. Alert and oriented x 3. Normal mood.   Labs on Admission: I have personally reviewed following labs and imaging studies  CBC: Recent Labs  Lab 10/07/21 1449  WBC 7.4  NEUTROABS 6.2  HGB 13.0  HCT 42.0  MCV 93.3  PLT 173   Basic Metabolic Panel: Recent Labs  Lab 10/07/21 1449  NA 137  K 4.1  CL 104  CO2 22  GLUCOSE 102*  BUN 15  CREATININE 0.77  CALCIUM 9.3  MG 2.0   Coagulation Profile: Recent Labs  Lab 10/07/21 1449  INR 1.5*  Radiological Exams on Admission: CT Head Wo Contrast  Result Date: 10/07/2021 CLINICAL DATA:  Head trauma, Minor. Anticoagulated with Eliquis. Additional history provided: Fall (hitting back of head). Neck trauma. EXAM: CT HEAD WITHOUT CONTRAST CT CERVICAL SPINE WITHOUT CONTRAST TECHNIQUE: Multidetector CT imaging of the head and cervical spine was performed following the standard protocol without intravenous contrast. Multiplanar CT image reconstructions of the cervical spine were also generated. RADIATION DOSE REDUCTION: This exam was performed according to the departmental dose-optimization program which includes automated exposure control, adjustment of the mA and/or kV according to patient size and/or use of iterative reconstruction technique. COMPARISON:  Prior head CT examinations 09/03/2017 and earlier. Report from radiographs of the cervical spine 04/27/2001 (images unavailable). FINDINGS: CT HEAD FINDINGS Brain: Generalized cerebral atrophy. Commensurate prominence of the ventricles and sulci. Moderate to advanced patchy and confluent hypoattenuation within the cerebral white matter, nonspecific but compatible with chronic small vessel ischemic disease. There is no acute intracranial hemorrhage. No demarcated cortical infarct. No extra-axial fluid collection. No evidence of an intracranial  mass. No midline shift. Vascular: No hyperdense vessel.  Atherosclerotic calcifications. Skull: Normal. Negative for fracture or focal lesion. Sinuses/Orbits: Visualized orbits show no acute finding. Mild-to-moderate mucosal thickening within the inferior right maxillary sinus. Moderate-sized fluid level, and background mild mucosal thickening, within the imaged left maxillary sinus. Moderate mucosal thickening within the bilateral ethmoid sinuses. Mild mucosal thickening within the frontoethmoidal recesses, bilaterally. CT CERVICAL SPINE FINDINGS Alignment: Trace C5-C6 and T1-T2 grade 1 anterolisthesis. Skull base and vertebrae: The basion-dental and atlanto-dental intervals are maintained.No evidence of acute fracture to the cervical spine. Soft tissues and spinal canal: No prevertebral fluid or swelling. No visible canal hematoma. Disc levels: Prior C6-C7 ACDF.  No evidence of acute hardware compromise. Cervical spondylosis. No more than mild disc space narrowing. Multilevel disc bulges/central disc protrusions, facet arthrosis and ligamentum flavum hypertrophy. No appreciable high-grade spinal canal stenosis. No high-grade bony neural foraminal narrowing. Upper chest: No consolidation within the imaged lung apices. Biapical pleuroparenchymal scarring. Ill-defined opacity within the left upper lobe, minimally included in the field of view. IMPRESSION: CT head: 1. No evidence of acute intracranial abnormality. 2. Moderate to advanced chronic small vessel ischemic changes within the cerebral white matter. 3. Generalized cerebral atrophy. 4. Paranasal sinus disease, as described. Correlate for acute sinusitis. CT cervical spine: 1. No evidence of acute fracture to the cervical spine. 2. Trace C5-C6 and T1-T2 grade 1 anterolisthesis. 3. Prior C6-C7 ACDF.  No evidence of acute hardware compromise. 4. Cervical spondylosis, as described. 5. Ill-defined opacity within left upper lobe, only minimally included in the field  of view. A chest radiograph is recommended for further evaluation. Electronically Signed   By: Jackey Loge D.O.   On: 10/07/2021 14:57   CT Cervical Spine Wo Contrast  Result Date: 10/07/2021 CLINICAL DATA:  Head trauma, Minor. Anticoagulated with Eliquis. Additional history provided: Fall (hitting back of head). Neck trauma. EXAM: CT HEAD WITHOUT CONTRAST CT CERVICAL SPINE WITHOUT CONTRAST TECHNIQUE: Multidetector CT imaging of the head and cervical spine was performed following the standard protocol without intravenous contrast. Multiplanar CT image reconstructions of the cervical spine were also generated. RADIATION DOSE REDUCTION: This exam was performed according to the departmental dose-optimization program which includes automated exposure control, adjustment of the mA and/or kV according to patient size and/or use of iterative reconstruction technique. COMPARISON:  Prior head CT examinations 09/03/2017 and earlier. Report from radiographs of the cervical spine 04/27/2001 (images unavailable). FINDINGS: CT HEAD FINDINGS  Brain: Generalized cerebral atrophy. Commensurate prominence of the ventricles and sulci. Moderate to advanced patchy and confluent hypoattenuation within the cerebral white matter, nonspecific but compatible with chronic small vessel ischemic disease. There is no acute intracranial hemorrhage. No demarcated cortical infarct. No extra-axial fluid collection. No evidence of an intracranial mass. No midline shift. Vascular: No hyperdense vessel.  Atherosclerotic calcifications. Skull: Normal. Negative for fracture or focal lesion. Sinuses/Orbits: Visualized orbits show no acute finding. Mild-to-moderate mucosal thickening within the inferior right maxillary sinus. Moderate-sized fluid level, and background mild mucosal thickening, within the imaged left maxillary sinus. Moderate mucosal thickening within the bilateral ethmoid sinuses. Mild mucosal thickening within the frontoethmoidal  recesses, bilaterally. CT CERVICAL SPINE FINDINGS Alignment: Trace C5-C6 and T1-T2 grade 1 anterolisthesis. Skull base and vertebrae: The basion-dental and atlanto-dental intervals are maintained.No evidence of acute fracture to the cervical spine. Soft tissues and spinal canal: No prevertebral fluid or swelling. No visible canal hematoma. Disc levels: Prior C6-C7 ACDF.  No evidence of acute hardware compromise. Cervical spondylosis. No more than mild disc space narrowing. Multilevel disc bulges/central disc protrusions, facet arthrosis and ligamentum flavum hypertrophy. No appreciable high-grade spinal canal stenosis. No high-grade bony neural foraminal narrowing. Upper chest: No consolidation within the imaged lung apices. Biapical pleuroparenchymal scarring. Ill-defined opacity within the left upper lobe, minimally included in the field of view. IMPRESSION: CT head: 1. No evidence of acute intracranial abnormality. 2. Moderate to advanced chronic small vessel ischemic changes within the cerebral white matter. 3. Generalized cerebral atrophy. 4. Paranasal sinus disease, as described. Correlate for acute sinusitis. CT cervical spine: 1. No evidence of acute fracture to the cervical spine. 2. Trace C5-C6 and T1-T2 grade 1 anterolisthesis. 3. Prior C6-C7 ACDF.  No evidence of acute hardware compromise. 4. Cervical spondylosis, as described. 5. Ill-defined opacity within left upper lobe, only minimally included in the field of view. A chest radiograph is recommended for further evaluation. Electronically Signed   By: Jackey Loge D.O.   On: 10/07/2021 14:57   CT PELVIS WO CONTRAST  Result Date: 10/07/2021 CLINICAL DATA:  Pelvic fracture Right hip x-ray showing concern for pelvic fracture EXAM: CT PELVIS WITHOUT CONTRAST TECHNIQUE: Multidetector CT imaging of the pelvis was performed following the standard protocol without intravenous contrast. RADIATION DOSE REDUCTION: This exam was performed according to the  departmental dose-optimization program which includes automated exposure control, adjustment of the mA and/or kV according to patient size and/or use of iterative reconstruction technique. COMPARISON:  Same day radiograph FINDINGS: Urinary Tract:  Marked bladder distension. Bowel:  Sigmoid diverticulosis.  Normal appendix. Vascular/Lymphatic: Aortoiliac atherosclerotic calcifications. No lymphadenopathy. Reproductive:  Prior hysterectomy. Other: There is a right inguinal hernia containing nonobstructed bowel and a small amount of ascitic fluid. Musculoskeletal: Mild displaced right inferior pubic ramus fracture and fracture at the right puboacetabular junction. No evidence of femoral neck fracture. Osteopenia. Mild bilateral hip osteoarthritis. Bilateral SI joint and lower lumbar spine degenerative changes. IMPRESSION: Mild displaced fractures of the right inferior pubic ramus and right puboacetabular junction. Electronically Signed   By: Caprice Renshaw M.D.   On: 10/07/2021 16:36   DG Chest Port 1 View  Result Date: 10/07/2021 CLINICAL DATA:  Fall EXAM: PORTABLE CHEST 1 VIEW COMPARISON:  09/03/2017 FINDINGS: Single frontal view of chest demonstrates stable enlargement the cardiac silhouette. Curvilinear densities in the lung apices likely reflect pleural and parenchymal scarring, corresponding to finding on earlier cervical spine CT. No acute airspace disease, effusion, or pneumothorax. No acute bony abnormalities. IMPRESSION: 1.  Biapical pleural and parenchymal scarring, corresponding to previous CT findings. 2. No acute intrathoracic process. Electronically Signed   By: Sharlet Salina M.D.   On: 10/07/2021 15:32   DG Hip Unilat With Pelvis 2-3 Views Right  Result Date: 10/07/2021 CLINICAL DATA:  RIGHT hip pain EXAM: DG HIP (WITH OR WITHOUT PELVIS) 2-3V RIGHT COMPARISON:  Pelvic film 04/03/2013 FINDINGS: There is disruption of the RIGHT iliopectineal line. This discontinuity is asymmetric from RIGHT to LEFT.  Additionally there is irregularity of the RIGHT inferior pubic ramus. No RIGHT femoral neck fracture. IMPRESSION: Findings concerning for fracture of the RIGHT superior and inferior pubic ramus. Consider CT of the pelvis for further evaluation Electronically Signed   By: Genevive Bi M.D.   On: 10/07/2021 14:57    EKG: Independently reviewed.  Atrial fibrillation rate 131, no significant change from prior.  QTc 474.  Assessment/Plan Principal Problem:   Pubic bone fracture (HCC) Active Problems:   Atrial fibrillation (HCC)   Fall   Assessment and Plan: * Pubic bone fracture (HCC) Mechanical fall with subsequent pubic bone fracture. -CT pelvis shows mild displaced fracture of right inferior pubic ramus and right puboacetabular junction. - EDP talked with Dr. Burt Knack, weightbearing as tolerated, pain control -Hydrocodone/acetaminophen 10/22/2023 every 6 hourly as needed -PT evaluation  Fall Fall at home while using her walker.  She reports last fall was months ago.  She denies frequent falls.  Head CT and cervical without acute abnormality. -PT evaluation - N/s 75cc/hr x 15hrs  Atrial fibrillation (HCC) Heart rate 110s to 130.  Has not gotten her medications today.  She is on metoprolol twice daily. Daughter in law doesn't think patient take cardizem anymore. Used to follow with Dr. Cindra Eves, but hasn't in a while, last visit was 2021. -Resume Eliquis - Resume home metoprolol -IV metoprolol 5 mg every x 2    DVT prophylaxis: Eliquis Code Status: DNR-confirmed with patient and daughter-in-law Bonita Quin at bedside. Family Communication: Daughter-in-law Bonita Quin at bedside Disposition Plan: ~ 1 -2 days Consults called: None Admission status: Obs tele   Onnie Boer MD [facility]  10/07/2021, 8:33 PM    For on call review [email or web address].

## 2021-10-07 NOTE — Assessment & Plan Note (Addendum)
10/10/21--transferred to SDU and started on diltiazem drip ?Transitioned to po diltiazem>>increase dose to 60 q 6 ?Restart metoprolol po ?4/23 Echo EF 65-70%, no WMA, RVSP 43.7; moderate pericardial eff without tamponade ?TSH 1.548 ?Continue apixaban ?

## 2021-10-07 NOTE — ED Triage Notes (Signed)
Patient via EMS from home due to fall with complaints of right hip pain.  ?

## 2021-10-08 DIAGNOSIS — S32501A Unspecified fracture of right pubis, initial encounter for closed fracture: Secondary | ICD-10-CM | POA: Diagnosis not present

## 2021-10-08 MED ORDER — METOPROLOL TARTRATE 5 MG/5ML IV SOLN
5.0000 mg | INTRAVENOUS | Status: AC | PRN
  Administered 2021-10-09 – 2021-10-10 (×2): 5 mg via INTRAVENOUS
  Filled 2021-10-08 (×2): qty 5

## 2021-10-08 NOTE — Progress Notes (Signed)
Pt has been pleasant throughout this writers shift. No complaints of pain or any discomfort. Pt heart rate continues to be low teens to upper 130's but remains asymptomatic. Will continue to monitor this pt.  ?

## 2021-10-08 NOTE — Progress Notes (Signed)
Pt heart rate running between low 1 teens to 130's. Pt asymptomatic. PRN dose of Metoprolol given for one time order. MD paged. No new orders at this time. Will continue to monitor. ?

## 2021-10-08 NOTE — Progress Notes (Signed)
Pt OOB full assist by PT staff, currently up in recliner. Denies any c/o at present. ?

## 2021-10-08 NOTE — Progress Notes (Signed)
?PROGRESS NOTE ? ? ? ?Marcia Woods  WNI:[id number] DOB: Jun 23, 1925 DOA: 10/07/2021 ?PCP: [name], MD  ? ?  ?Brief Narrative:  ?Marcia Woods is a 88 y.o. female with medical history significant for atrial fibrillation on chronic anticoagulation with Eliquis. Patient was brought to the ED via EMS with reports of a fall with complaints of right hip pain.  Patient lives alone, family check on her daily.  Patient reports she does not know why she fell.  She did not lose consciousness.  But she did hit her head.  She reports she was walking with her walker which she normally uses when suddenly her legs gave out and she was on the floor. Pelvic CT showed mild displaced fractures of the right inferior pubic ramus and right puboacetabular junction.  Orthopedic surgery recommended weightbearing as tolerated, pain control, nonsurgical management. ? ?New events last 24 hours / Subjective: ?Patient without any pain at rest.  Has not attempted to get out of bed yet.  After my evaluation, PT saw patient and recommended SNF placement. ? ?Assessment & Plan: ?  ? ?Principal Problem: ?  Pubic bone fracture ([place]) ?Active Problems: ?  Atrial fibrillation ([place]) ?  Fall ? ? ?Pelvic fracture status post mechanical fall ?-CT pelvis shows mild displaced fracture of right inferior pubic ramus and right puboacetabular junction ?-EDP talked with Dr. Regino Bellow, weightbearing as tolerated, pain control ?-PT recommending SNF placement ?-Pain control ? ?Chronic A-fib ?-Continue metoprolol, Eliquis ? ? ?DVT prophylaxis:  ? ?apixaban (ELIQUIS) tablet 5 mg  ?Code Status: DNR ?Family Communication: No family at bedside ?Disposition Plan:  ?Status is: Observation ?The patient will require care spanning > 2 midnights and should be moved to inpatient because: Safe disposition, she will need to discharge to SNF ? ?Antimicrobials:  ?Anti-infectives (From admission, onward)  ? ? None  ? ?  ? ? ? ?Objective: ?Vitals:  ? 10/08/21 0242 10/08/21 0407  10/08/21 0516 10/08/21 0845  ?BP: (!) 152/100 (!) 134/98 (!) 141/91 120/88  ?Pulse: (!) 110 92 72 97  ?Resp: '17 16  16  '$ ?Temp: 97.8 ?F (36.6 ?C) 97.9 ?F (36.6 ?C) 98.1 ?F (36.7 ?C) 98.6 ?F (37 ?C)  ?TempSrc: Oral Oral  Oral  ?SpO2:   97% 97%  ?Weight:      ?Height:      ? ? ?Intake/Output Summary (Last 24 hours) at 10/08/2021 1305 ?Last data filed at 10/08/2021 1216 ?Gross per 24 hour  ?Intake 580 ml  ?Output 150 ml  ?Net 430 ml  ? ?Filed Weights  ? 10/07/21 1355  ?Weight: 65.8 kg  ? ? ?Examination:  ?General exam: Appears calm and comfortable  ?Respiratory system: Clear to auscultation. Respiratory effort normal. No respiratory distress. No conversational dyspnea.  ?Cardiovascular system: S1 & S2 heard, irregular rhythm, rate 130s. No murmurs. No pedal edema. ?Gastrointestinal system: Abdomen is nondistended, soft and nontender. Normal bowel sounds heard. ?Central nervous system: Alert and oriented. No focal neurological deficits. Speech clear.  ?Extremities: Symmetric in appearance  ?Skin: No rashes, lesions or ulcers on exposed skin  ?Psychiatry: Judgement and insight appear normal. Mood & affect appropriate.  ? ?Data Reviewed: I have personally reviewed following labs and imaging studies ? ?CBC: ?Recent Labs  ?Lab 10/07/21 ?1449  ?WBC 7.4  ?NEUTROABS 6.2  ?HGB 13.0  ?HCT 42.0  ?MCV 93.3  ?PLT 173  ? ?Basic Metabolic Panel: ?Recent Labs  ?Lab 10/07/21 ?1449  ?NA 137  ?K 4.1  ?CL 104  ?CO2 22  ?  GLUCOSE 102*  ?BUN 15  ?CREATININE 0.77  ?CALCIUM 9.3  ?MG 2.0  ? ?GFR: ?Estimated Creatinine Clearance: 38.1 mL/min (by C-G formula based on SCr of 0.77 mg/dL). ?Liver Function Tests: ?No results for input(s): AST, ALT, ALKPHOS, BILITOT, PROT, ALBUMIN in the last 168 hours. ?No results for input(s): LIPASE, AMYLASE in the last 168 hours. ?No results for input(s): AMMONIA in the last 168 hours. ?Coagulation Profile: ?Recent Labs  ?Lab 10/07/21 ?1449  ?INR 1.5*  ? ?Cardiac Enzymes: ?No results for input(s): CKTOTAL, CKMB,  CKMBINDEX, TROPONINI in the last 168 hours. ?BNP (last 3 results) ?No results for input(s): PROBNP in the last 8760 hours. ?HbA1C: ?No results for input(s): HGBA1C in the last 72 hours. ?CBG: ?No results for input(s): GLUCAP in the last 168 hours. ?Lipid Profile: ?No results for input(s): CHOL, HDL, LDLCALC, TRIG, CHOLHDL, LDLDIRECT in the last 72 hours. ?Thyroid Function Tests: ?No results for input(s): TSH, T4TOTAL, FREET4, T3FREE, THYROIDAB in the last 72 hours. ?Anemia Panel: ?No results for input(s): VITAMINB12, FOLATE, FERRITIN, TIBC, IRON, RETICCTPCT in the last 72 hours. ?Sepsis Labs: ?No results for input(s): PROCALCITON, LATICACIDVEN in the last 168 hours. ? ?No results found for this or any previous visit (from the past 240 hour(s)).  ? ? ?Radiology Studies: ?CT Head Wo Contrast ? ?Result Date: 10/07/2021 ?CLINICAL DATA:  Head trauma, Minor. Anticoagulated with Eliquis. Additional history provided: Fall (hitting back of head). Neck trauma. EXAM: CT HEAD WITHOUT CONTRAST CT CERVICAL SPINE WITHOUT CONTRAST TECHNIQUE: Multidetector CT imaging of the head and cervical spine was performed following the standard protocol without intravenous contrast. Multiplanar CT image reconstructions of the cervical spine were also generated. RADIATION DOSE REDUCTION: This exam was performed according to the departmental dose-optimization program which includes automated exposure control, adjustment of the mA and/or kV according to patient size and/or use of iterative reconstruction technique. COMPARISON:  Prior head CT examinations 09/03/2017 and earlier. Report from radiographs of the cervical spine 04/27/2001 (images unavailable). FINDINGS: CT HEAD FINDINGS Brain: Generalized cerebral atrophy. Commensurate prominence of the ventricles and sulci. Moderate to advanced patchy and confluent hypoattenuation within the cerebral white matter, nonspecific but compatible with chronic small vessel ischemic disease. There is no acute  intracranial hemorrhage. No demarcated cortical infarct. No extra-axial fluid collection. No evidence of an intracranial mass. No midline shift. Vascular: No hyperdense vessel.  Atherosclerotic calcifications. Skull: Normal. Negative for fracture or focal lesion. Sinuses/Orbits: Visualized orbits show no acute finding. Mild-to-moderate mucosal thickening within the inferior right maxillary sinus. Moderate-sized fluid level, and background mild mucosal thickening, within the imaged left maxillary sinus. Moderate mucosal thickening within the bilateral ethmoid sinuses. Mild mucosal thickening within the frontoethmoidal recesses, bilaterally. CT CERVICAL SPINE FINDINGS Alignment: Trace C5-C6 and T1-T2 grade 1 anterolisthesis. Skull base and vertebrae: The basion-dental and atlanto-dental intervals are maintained.No evidence of acute fracture to the cervical spine. Soft tissues and spinal canal: No prevertebral fluid or swelling. No visible canal hematoma. Disc levels: Prior C6-C7 ACDF.  No evidence of acute hardware compromise. Cervical spondylosis. No more than mild disc space narrowing. Multilevel disc bulges/central disc protrusions, facet arthrosis and ligamentum flavum hypertrophy. No appreciable high-grade spinal canal stenosis. No high-grade bony neural foraminal narrowing. Upper chest: No consolidation within the imaged lung apices. Biapical pleuroparenchymal scarring. Ill-defined opacity within the left upper lobe, minimally included in the field of view. IMPRESSION: CT head: 1. No evidence of acute intracranial abnormality. 2. Moderate to advanced chronic small vessel ischemic changes within the cerebral white matter. 3.  Generalized cerebral atrophy. 4. Paranasal sinus disease, as described. Correlate for acute sinusitis. CT cervical spine: 1. No evidence of acute fracture to the cervical spine. 2. Trace C5-C6 and T1-T2 grade 1 anterolisthesis. 3. Prior C6-C7 ACDF.  No evidence of acute hardware compromise.  4. Cervical spondylosis, as described. 5. Ill-defined opacity within left upper lobe, only minimally included in the field of view. A chest radiograph is recommended for further evaluation. Electronically Signed

## 2021-10-08 NOTE — Care Management Obs Status (Signed)
MEDICARE OBSERVATION STATUS NOTIFICATION ? ? ?Patient Details  ?Name: Marcia Woods ?MRN: [id number] ?Date of Birth: [birth year] ? ? ?Medicare Observation Status Notification Given:  Yes ? ? ? ?[name] ?10/08/2021, 3:59 PM ?

## 2021-10-08 NOTE — NC FL2 (Signed)
?[place] MEDICAID FL2 LEVEL OF CARE SCREENING TOOL  ?  ? ?IDENTIFICATION  ?Patient Name: ?Marcia Woods Birthdate: 1925-12-19 Sex: female Admission Date (Current Location): ?10/07/2021  ?[place] and [place] Number: ? [place] and Address:  ?[facility] [address], [place] ?     Provider Number: ?[id number]  ?Attending Physician Name and Address:  ?Dessa Phi, DO ? Relative Name and Phone Number:  ?Cleone, Hulick ( Daughter in [place]) [phone] ?   ?Current Level of Care: ?Hospital Recommended Level of Care: ?[place] Prior Approval Number: ?  ? ?Date Approved/Denied: ?  PASRR Number: ?[id number] A ? ?Discharge Plan: ?SNF ?  ? ?Current Diagnoses: ?Patient Active Problem List  ? Diagnosis Date Noted  ? Pubic bone fracture ([place]) 10/07/2021  ? Fall 10/07/2021  ? Dermatitis of lower extremity 11/18/2015  ? Fracture, radius, distal   ? Vaginal odor 01/29/2014  ? Leg edema 12/24/2013  ? Solitary lung nodule 06/28/2013  ? Atrial fibrillation ([place]) 06/01/2013  ? PSVT (paroxysmal supraventricular tachycardia) ([place]) 03/05/2013  ? Palpitations 03/03/2013  ? Mixed hyperlipidemia 03/03/2013  ? GERD (gastroesophageal reflux disease) 03/03/2013  ? ? ?Orientation RESPIRATION BLADDER Height & Weight   ?  ?Situation, Place, Time, Self ? Normal External catheter Weight: 65.8 kg ?Height:  '5\' 1"'$  (154.9 cm)  ?BEHAVIORAL SYMPTOMS/MOOD NEUROLOGICAL BOWEL NUTRITION STATUS  ?    Continent Diet (See DC Summary)  ?AMBULATORY STATUS COMMUNICATION OF NEEDS Skin   ?Extensive Assist Verbally Bruising, Skin abrasions ?  ?  ?  ?    ?     ?     ? ? ?Personal Care Assistance Level of Assistance  ?Bathing, Feeding, Dressing Bathing Assistance: Maximum assistance ?Feeding assistance: Limited assistance ?Dressing Assistance: Maximum assistance ?   ? ?Functional Limitations Info  ?Sight, Speech, Hearing Sight Info: Impaired ?Hearing Info: Adequate ?   ? ? ?SPECIAL CARE FACTORS FREQUENCY  ?PT (By  licensed PT)   ?  ?PT Frequency: 5 times a week ?  ?  ?  ?  ?   ? ? ?Contractures Contractures Info: Not present  ? ? ?Additional Factors Info  ?Code Status, Allergies Code Status Info: DNR ?Allergies Info: Codeine ?  ?  ?  ?   ? ?Current Medications (10/08/2021):  This is the current hospital active medication list ?Current Facility-Administered Medications  ?Medication Dose Route Frequency Provider Last Rate Last Admin  ? acetaminophen (TYLENOL) tablet 650 mg  650 mg Oral Q6H PRN Emokpae, Ejiroghene E, MD      ? Or  ? acetaminophen (TYLENOL) suppository 650 mg  650 mg Rectal Q6H PRN Emokpae, Ejiroghene E, MD      ? apixaban (ELIQUIS) tablet 5 mg  5 mg Oral BID Emokpae, Ejiroghene E, MD   5 mg at 10/08/21 0810  ? HYDROcodone-acetaminophen (NORCO/VICODIN) 5-325 MG per tablet 1 tablet  1 tablet Oral Q6H PRN Emokpae, Ejiroghene E, MD   1 tablet at 10/08/21 1030  ? metoprolol tartrate (LOPRESSOR) injection 5 mg  5 mg Intravenous Q5 min PRN Dessa Phi, DO      ? metoprolol tartrate (LOPRESSOR) tablet 50 mg  50 mg Oral BID Emokpae, Ejiroghene E, MD   50 mg at 10/08/21 1125  ? ondansetron (ZOFRAN) tablet 4 mg  4 mg Oral Q6H PRN Emokpae, Ejiroghene E, MD      ? Or  ? ondansetron (ZOFRAN) injection 4 mg  4 mg Intravenous Q6H PRN Emokpae, Ejiroghene E, MD      ?  polyethylene glycol (MIRALAX / GLYCOLAX) packet 17 g  17 g Oral Daily PRN Emokpae, Ejiroghene E, MD      ? ? ? ?Discharge Medications: ?Please see discharge summary for a list of discharge medications. ? ?Relevant Imaging Results: ? ?Relevant Lab Results: ? ? ?Additional Information ?SS# [id number] ? ?Boneta Lucks, RN ? ? ? ? ?

## 2021-10-08 NOTE — Plan of Care (Signed)
?  Problem: Acute Rehab PT Goals(only PT should resolve) ?Goal: Pt Will Go Supine/Side To Sit ?Outcome: Progressing ?Flowsheets (Taken 10/08/2021 1228) ?Pt will go Supine/Side to Sit: with moderate assist ?Goal: Patient Will Transfer Sit To/From Stand ?Outcome: Progressing ?Flowsheets (Taken 10/08/2021 1228) ?Patient will transfer sit to/from stand: with moderate assist ?Goal: Pt Will Transfer Bed To Chair/Chair To Bed ?Outcome: Progressing ?Flowsheets (Taken 10/08/2021 1228) ?Pt will Transfer Bed to Chair/Chair to Bed: with mod assist ?Goal: Pt Will Ambulate ?Outcome: Progressing ?Flowsheets (Taken 10/08/2021 1228) ?Pt will Ambulate: ? 15 feet ? with moderate assist ? with rolling walker ?  ?12:28 PM, 10/08/21 ?Lonell Grandchild, MPT ?Physical Therapist with [place] ?[facility] ?[phone] office ?[phone] mobile phone ? ?

## 2021-10-08 NOTE — Evaluation (Signed)
Physical Therapy Evaluation ?Patient Details ?Name: Marcia Woods ?MRN: [id number] ?DOB: 1926-04-22 ?Today's Date: 10/08/2021 ? ?History of Present Illness ? Marcia Woods is a 88 y.o. female with medical history significant for atrial fibrillation on chronic anticoagulation with Eliquis.  Patient was brought to the ED via EMS with reports of a fall with complaints of right hip pain.  Patient lives alone, family check on her daily.  Patient reports she does not know why she fell.  She did not lose consciousness.  But she did hit her head.  She reports she was walking with her walker which she normally uses when suddenly her legs gave out and she was on the floor.  2 family members were already on the way to check on patient as they normally do patient had not been down for long.  She denies chest pain, no difficulty breathing, no palpitations. ?  ?Clinical Impression ? Patient demonstrates slow labored movement for sitting up at bedside with poor carryover for using BUE during supine to sitting, c/o severe pain with any movement of RLE, limited to standing for a few seconds with RW, but unable to take steps or transfer using RW due to BLE weakness and severe pain.  Patient required Max assist stand pivot with left knee blocked to transfer to chair.  Patient tolerated sitting up in chair after therapy with family members present in room - RN notified.  Patient will benefit from continued skilled physical therapy in hospital and recommended venue below to increase strength, balance, endurance for safe ADLs and gait.  ? ?   ?   ? ?Recommendations for follow up therapy are one component of a multi-disciplinary discharge planning process, led by the attending physician.  Recommendations may be updated based on patient status, additional functional criteria and insurance authorization. ? ?Follow Up Recommendations Skilled nursing-short term rehab (<3 hours/day) ? ?  ?Assistance Recommended at Discharge Intermittent  Supervision/Assistance  ?Patient can return home with the following ? A lot of help with bathing/dressing/bathroom;A lot of help with walking and/or transfers;Help with stairs or ramp for entrance;Assistance with cooking/housework ? ?  ?Equipment Recommendations None recommended by PT  ?Recommendations for Other Services ?    ?  ?Functional Status Assessment Patient has had a recent decline in their functional status and demonstrates the ability to make significant improvements in function in a reasonable and predictable amount of time.  ? ?  ?Precautions / Restrictions Precautions ?Precautions: Fall ?Restrictions ?Weight Bearing Restrictions: Yes ?RLE Weight Bearing: Weight bearing as tolerated ?LLE Weight Bearing: Weight bearing as tolerated  ? ?  ? ?Mobility ? Bed Mobility ?Overal bed mobility: Needs Assistance ?Bed Mobility: Supine to Sit ?  ?  ?Supine to sit: Max assist ?  ?  ?General bed mobility comments: increased time, poor carryover for using BUE during supine to sitting ?  ? ?Transfers ?Overall transfer level: Needs assistance ?Equipment used: Rolling walker (2 wheels), None, 1 person hand held assist ?Transfers: Sit to/from Stand ?Sit to Stand: Max assist ?Stand pivot transfers: Max assist ?  ?  ?  ?  ?General transfer comment: stood with RW, but unable to take steps, required Max assist stand pivot with LLE blocked ?  ? ?Ambulation/Gait ?  ?  ?  ?  ?  ?  ?  ?  ? ?Stairs ?  ?  ?  ?  ?  ? ?Wheelchair Mobility ?  ? ?Modified Rankin (Stroke Patients Only) ?  ? ?  ? ?  Balance Overall balance assessment: Needs assistance ?Sitting-balance support: Feet supported, No upper extremity supported ?Sitting balance-Leahy Scale: Poor ?Sitting balance - Comments: fair/poor seated at EOB ?Postural control: Left lateral lean ?Standing balance support: Reliant on assistive device for balance, During functional activity, Bilateral upper extremity supported ?Standing balance-Leahy Scale: Poor ?Standing balance comment: using  RW ?  ?  ?  ?  ?  ?  ?  ?  ?  ?  ?  ?   ? ? ? ?Pertinent Vitals/Pain Pain Assessment ?Pain Assessment: Faces ?Faces Pain Scale: Hurts whole lot ?Pain Location: right hip and LLE with pressure ?Pain Descriptors / Indicators: Grimacing, Guarding, Sore, Discomfort ?Pain Intervention(s): Limited activity within patient's tolerance, Monitored during session, Premedicated before session, Repositioned  ? ? ?Home Living Family/patient expects to be discharged to:: Private residence ?Living Arrangements: Alone ?Available Help at Discharge: Family;Available PRN/intermittently ?Type of Home: House ?Home Access: Stairs to enter ?Entrance Stairs-Rails: Right;Left (to wide to reach both) ?Entrance Stairs-Number of Steps: 4 ?  ?Home Layout: One level ?Home Equipment: Conservation officer, nature (2 wheels);Wheelchair - manual;Grab bars - tub/shower;Shower seat ?   ?  ?Prior Function Prior Level of Function : Independent/Modified Independent ?  ?  ?  ?  ?  ?  ?Mobility Comments: household ambulator using RW ?ADLs Comments: assisted by family ?  ? ? ?Hand Dominance  ?   ? ?  ?Extremity/Trunk Assessment  ? Upper Extremity Assessment ?Upper Extremity Assessment: Generalized weakness ?  ? ?Lower Extremity Assessment ?Lower Extremity Assessment: Generalized weakness;RLE deficits/detail ?RLE Deficits / Details: grossly -3/5 ?RLE: Unable to fully assess due to pain ?RLE Sensation: WNL ?RLE Coordination: WNL ?  ? ?Cervical / Trunk Assessment ?Cervical / Trunk Assessment: Normal  ?Communication  ? Communication: No difficulties  ?Cognition Arousal/Alertness: Awake/alert ?Behavior During Therapy: [facility] for tasks assessed/performed ?Overall Cognitive Status: Within Functional Limits for tasks assessed ?  ?  ?  ?  ?  ?  ?  ?  ?  ?  ?  ?  ?  ?  ?  ?  ?  ?  ?  ? ?  ?General Comments   ? ?  ?Exercises    ? ?Assessment/Plan  ?  ?PT Assessment Patient needs continued PT services  ?PT Problem List Decreased strength;Decreased activity tolerance;Decreased  balance;Decreased mobility;Pain ? ?   ?  ?PT Treatment Interventions DME instruction;Gait training;Stair training;Functional mobility training;Therapeutic activities;Therapeutic exercise;Patient/family education;Balance training   ? ?PT Goals (Current goals can be found in the Care Plan section)  ?Acute Rehab PT Goals ?Patient Stated Goal: return home after rehab ?PT Goal Formulation: With patient/family ?Time For Goal Achievement: 10/22/21 ?Potential to Achieve Goals: Good ? ?  ?Frequency Min 3X/week ?  ? ? ?Co-evaluation   ?  ?  ?  ?  ? ? ?  ?AM-PAC PT "6 Clicks" Mobility  ?Outcome Measure Help needed turning from your back to your side while in a flat bed without using bedrails?: A Lot ?Help needed moving from lying on your back to sitting on the side of a flat bed without using bedrails?: A Lot ?Help needed moving to and from a bed to a chair (including a wheelchair)?: A Lot ?Help needed standing up from a chair using your arms (e.g., wheelchair or bedside chair)?: A Lot ?Help needed to walk in hospital room?: Total ?Help needed climbing 3-5 steps with a railing? : Total ?6 Click Score: 10 ? ?  ?End of Session   ?Activity Tolerance: Patient tolerated  treatment well;Patient limited by fatigue ?Patient left: in chair;with chair alarm set;with call bell/phone within reach;with family/visitor present ?Nurse Communication: Mobility status ?PT Visit Diagnosis: Unsteadiness on feet (R26.81);Other abnormalities of gait and mobility (R26.89);Muscle weakness (generalized) (M62.81) ?  ? ?Time: 8887-5797 ?PT Time Calculation (min) (ACUTE ONLY): 32 min ? ? ?Charges:   PT Evaluation ?$PT Eval Moderate Complexity: 1 Mod ?PT Treatments ?$Therapeutic Activity: 23-37 mins ?  ?   ? ? ?12:26 PM, 10/08/21 ?Lonell Grandchild, MPT ?Physical Therapist with [place] ?[facility] ?[phone] office ?[phone] mobile phone ? ? ?

## 2021-10-08 NOTE — TOC Initial Note (Signed)
Transition of Care (TOC) - Initial/Assessment Note  ? ? ?Patient Details  ?Name: Marcia Woods ?MRN: [id number] ?Date of Birth: 03/21/1926 ? ?Transition of Care (TOC) CM/SW Contact:    ?  , LCSWA ?Phone Number: ?10/08/2021, 11:49 AM ? ?Clinical Narrative:                 ?TOC updated that PT is recommending SNF for pt at D/C. CSW met with pt and family in room to speak about SNF recommendation. Pt states that she is agreeable to SNF at this time. Pt choice is [facility] first and [facility] second. Pts Fl2 and Pas[place] have been completed. SNF referral has been sent to requested facilities. TOC to follow.  ? ?Expected Discharge Plan: Skilled Nursing Facility ?Barriers to Discharge: Continued Medical Work up ? ? ?Patient Goals and CMS Choice ?Patient states their goals for this hospitalization and ongoing recovery are:: go to SNF ?CMS Medicare.gov Compare Post Acute Care list provided to:: Patient ?Choice offered to / list presented to : Patient ? ?Expected Discharge Plan and Services ?Expected Discharge Plan: Skilled Nursing Facility ?  ?  ?Post Acute Care Choice: Skilled Nursing Facility ?Living arrangements for the past 2 months: Single Family Home ?                ?  ?  ?  ?  ?  ?  ?  ?  ?  ?  ? ?Prior Living Arrangements/Services ?Living arrangements for the past 2 months: Single Family Home ?Lives with:: Self ?Patient language and need for interpreter reviewed:: Yes ?Do you feel safe going back to the place where you live?: Yes      ?Need for Family Participation in Patient Care: Yes (Comment) ?Care giver support system in place?: Yes (comment) ?  ?Criminal Activity/Legal Involvement Pertinent to Current Situation/Hospitalization: No - Comment as needed ? ?Activities of Daily Living ?  ?  ? ?Permission Sought/Granted ?  ?  ?   ?   ?   ?   ? ?Emotional Assessment ?Appearance:: Appears stated age ?Attitude/Demeanor/Rapport: Engaged ?Affect (typically observed): Accepting ?Orientation: : Oriented  to Self, Oriented to  Time, Oriented to Place, Oriented to Situation ?Alcohol / Substance Use: Not Applicable ?Psych Involvement: No (comment) ? ?Admission diagnosis:  Pubic bone fracture ([facility]) [S32.509A] ?Fall, initial encounter [W19.XXXA] ?Patient Active Problem List  ? Diagnosis Date Noted  ? Pubic bone fracture ([facility]) 10/07/2021  ? Fall 10/07/2021  ? Dermatitis of lower extremity 11/18/2015  ? Fracture, radius, distal   ? Vaginal odor 01/29/2014  ? Leg edema 12/24/2013  ? Solitary lung nodule 06/28/2013  ? Atrial fibrillation ([facility]) 06/01/2013  ? PSVT (paroxysmal supraventricular tachycardia) ([facility]) 03/05/2013  ? Palpitations 03/03/2013  ? Mixed hyperlipidemia 03/03/2013  ? GERD (gastroesophageal reflux disease) 03/03/2013  ? ?PCP:  Fagan, Roy, MD ?Pharmacy:   ?[facility] - [place], [place] - [address] ?[address] ?[place] [address] ?Phone: [phone] Fax: [phone] ? ? ? ? ?Social Determinants of Health (SDOH) Interventions ?  ? ?Readmission Risk Interventions ?   ? View : No data to display.  ?  ?  ?  ? ? ? ?

## 2021-10-09 DIAGNOSIS — S32501A Unspecified fracture of right pubis, initial encounter for closed fracture: Secondary | ICD-10-CM | POA: Diagnosis not present

## 2021-10-09 MED ORDER — METOPROLOL TARTRATE 50 MG PO TABS
100.0000 mg | ORAL_TABLET | Freq: Two times a day (BID) | ORAL | Status: DC
Start: 2021-10-09 — End: 2021-10-10
  Administered 2021-10-09 – 2021-10-10 (×2): 100 mg via ORAL
  Filled 2021-10-09 (×2): qty 2

## 2021-10-09 NOTE — Plan of Care (Signed)
?  Problem: Acute Rehab OT Goals (only OT should resolve) ?Goal: Pt. Will Perform Grooming ?Flowsheets (Taken 10/09/2021 1057) ?Pt Will Perform Grooming: ? with modified independence ? sitting ?Goal: Pt. Will Perform Upper Body Dressing ?Flowsheets (Taken 10/09/2021 1057) ?Pt Will Perform Upper Body Dressing: ? with set-up ? sitting ?Goal: Pt. Will Perform Lower Body Dressing ?Flowsheets (Taken 10/09/2021 1057) ?Pt Will Perform Lower Body Dressing: ? with min assist ? with adaptive equipment ? sit to/from stand ?Goal: Pt. Will Transfer To Toilet ?Flowsheets (Taken 10/09/2021 1057) ?Pt Will Transfer to Toilet: ? regular height toilet ? with mod assist ?  ?Arvil Persons, OTR/L ? ?

## 2021-10-09 NOTE — Progress Notes (Signed)
?PROGRESS NOTE ? ? ? ?Marcia Woods  CXK:[id number] DOB: 07-17-1925 DOA: 10/07/2021 ?PCP: [name], MD  ? ?  ?Brief Narrative:  ?Marcia Woods is a 88 y.o. female with medical history significant for atrial fibrillation on chronic anticoagulation with Eliquis. Patient was brought to the ED via EMS with reports of a fall with complaints of right hip pain.  Patient lives alone, family check on her daily.  Patient reports she does not know why she fell.  She did not lose consciousness.  But she did hit her head.  She reports she was walking with her walker which she normally uses when suddenly her legs gave out and she was on the floor. Pelvic CT showed mild displaced fractures of the right inferior pubic ramus and right puboacetabular junction.  Orthopedic surgery recommended weightbearing as tolerated, pain control, nonsurgical management. ? ?New events last 24 hours / Subjective: ?Patient without any new complaints. Denies palpitations, CP, SOB.  ? ?Assessment & Plan: ?  ? ?Principal Problem: ?  Pubic bone fracture ([place]) ?Active Problems: ?  Atrial fibrillation ([place]) ?  Fall ? ? ?Pelvic fracture status post mechanical fall ?-CT pelvis shows mild displaced fracture of right inferior pubic ramus and right puboacetabular junction ?-EDP talked with Dr. Regino Bellow, weightbearing as tolerated, pain control ?-PT recommending SNF placement ?-Pain control ? ?Chronic A-fib ?-Continue metoprolol, Eliquis ?-Patient remains with elevated heart rate today.  I will increase this evening metoprolol dose, can give IV metoprolol as needed for sustained heart rate >130 ? ? ?DVT prophylaxis:  ? ?apixaban (ELIQUIS) tablet 5 mg  ?Code Status: DNR ?Family Communication: No family at bedside ?Disposition Plan:  ?Status is: Observation ?The patient will require care spanning > 2 midnights and should be moved to inpatient because: She needs improved heart rate control, will discharge to SNF 4/22 ? ?Antimicrobials:  ?Anti-infectives (From  admission, onward)  ? ? None  ? ?  ? ? ? ?Objective: ?Vitals:  ? 10/08/21 0845 10/08/21 1406 10/08/21 2119 10/09/21 0539  ?BP: 120/88 112/88 (!) 139/100 (!) 132/99  ?Pulse: 97 (!) 45 (!) 104 90  ?Resp: '16  19 16  '$ ?Temp: 98.6 ?F (37 ?C) 98.6 ?F (37 ?C) 98 ?F (36.7 ?C) 98.2 ?F (36.8 ?C)  ?TempSrc: Oral Oral  Oral  ?SpO2: 97% 97% 96% 95%  ?Weight:      ?Height:      ? ? ?Intake/Output Summary (Last 24 hours) at 10/09/2021 1230 ?Last data filed at 10/09/2021 0900 ?Gross per 24 hour  ?Intake 1080 ml  ?Output 1500 ml  ?Net -420 ml  ? ? ?Filed Weights  ? 10/07/21 1355  ?Weight: 65.8 kg  ? ? ?Examination:  ?General exam: Appears calm and comfortable  ?Respiratory system: Clear to auscultation. Respiratory effort normal. No respiratory distress. No conversational dyspnea.  ?Cardiovascular system: S1 & S2 heard, irregular rhythm, rate 130-140s. No murmurs. No pedal edema. ?Gastrointestinal system: Abdomen is nondistended, soft and nontender. Normal bowel sounds heard. ?Central nervous system: Alert and oriented. No focal neurological deficits. Speech clear.  ?Extremities: Symmetric in appearance  ?Skin: No rashes, lesions or ulcers on exposed skin  ?Psychiatry: Judgement and insight appear normal. Mood & affect appropriate.  ? ?Data Reviewed: I have personally reviewed following labs and imaging studies ? ?CBC: ?Recent Labs  ?Lab 10/07/21 ?1449  ?WBC 7.4  ?NEUTROABS 6.2  ?HGB 13.0  ?HCT 42.0  ?MCV 93.3  ?PLT 173  ? ? ?Basic Metabolic Panel: ?Recent Labs  ?Lab 10/07/21 ?1449  ?  NA 137  ?K 4.1  ?CL 104  ?CO2 22  ?GLUCOSE 102*  ?BUN 15  ?CREATININE 0.77  ?CALCIUM 9.3  ?MG 2.0  ? ? ?GFR: ?Estimated Creatinine Clearance: 38.1 mL/min (by C-G formula based on SCr of 0.77 mg/dL). ?Liver Function Tests: ?No results for input(s): AST, ALT, ALKPHOS, BILITOT, PROT, ALBUMIN in the last 168 hours. ?No results for input(s): LIPASE, AMYLASE in the last 168 hours. ?No results for input(s): AMMONIA in the last 168 hours. ?Coagulation  Profile: ?Recent Labs  ?Lab 10/07/21 ?1449  ?INR 1.5*  ? ? ?Cardiac Enzymes: ?No results for input(s): CKTOTAL, CKMB, CKMBINDEX, TROPONINI in the last 168 hours. ?BNP (last 3 results) ?No results for input(s): PROBNP in the last 8760 hours. ?HbA1C: ?No results for input(s): HGBA1C in the last 72 hours. ?CBG: ?No results for input(s): GLUCAP in the last 168 hours. ?Lipid Profile: ?No results for input(s): CHOL, HDL, LDLCALC, TRIG, CHOLHDL, LDLDIRECT in the last 72 hours. ?Thyroid Function Tests: ?No results for input(s): TSH, T4TOTAL, FREET4, T3FREE, THYROIDAB in the last 72 hours. ?Anemia Panel: ?No results for input(s): VITAMINB12, FOLATE, FERRITIN, TIBC, IRON, RETICCTPCT in the last 72 hours. ?Sepsis Labs: ?No results for input(s): PROCALCITON, LATICACIDVEN in the last 168 hours. ? ?No results found for this or any previous visit (from the past 240 hour(s)).  ? ? ?Radiology Studies: ?CT Head Wo Contrast ? ?Result Date: 10/07/2021 ?CLINICAL DATA:  Head trauma, Minor. Anticoagulated with Eliquis. Additional history provided: Fall (hitting back of head). Neck trauma. EXAM: CT HEAD WITHOUT CONTRAST CT CERVICAL SPINE WITHOUT CONTRAST TECHNIQUE: Multidetector CT imaging of the head and cervical spine was performed following the standard protocol without intravenous contrast. Multiplanar CT image reconstructions of the cervical spine were also generated. RADIATION DOSE REDUCTION: This exam was performed according to the departmental dose-optimization program which includes automated exposure control, adjustment of the mA and/or kV according to patient size and/or use of iterative reconstruction technique. COMPARISON:  Prior head CT examinations 09/03/2017 and earlier. Report from radiographs of the cervical spine 04/27/2001 (images unavailable). FINDINGS: CT HEAD FINDINGS Brain: Generalized cerebral atrophy. Commensurate prominence of the ventricles and sulci. Moderate to advanced patchy and confluent hypoattenuation  within the cerebral white matter, nonspecific but compatible with chronic small vessel ischemic disease. There is no acute intracranial hemorrhage. No demarcated cortical infarct. No extra-axial fluid collection. No evidence of an intracranial mass. No midline shift. Vascular: No hyperdense vessel.  Atherosclerotic calcifications. Skull: Normal. Negative for fracture or focal lesion. Sinuses/Orbits: Visualized orbits show no acute finding. Mild-to-moderate mucosal thickening within the inferior right maxillary sinus. Moderate-sized fluid level, and background mild mucosal thickening, within the imaged left maxillary sinus. Moderate mucosal thickening within the bilateral ethmoid sinuses. Mild mucosal thickening within the frontoethmoidal recesses, bilaterally. CT CERVICAL SPINE FINDINGS Alignment: Trace C5-C6 and T1-T2 grade 1 anterolisthesis. Skull base and vertebrae: The basion-dental and atlanto-dental intervals are maintained.No evidence of acute fracture to the cervical spine. Soft tissues and spinal canal: No prevertebral fluid or swelling. No visible canal hematoma. Disc levels: Prior C6-C7 ACDF.  No evidence of acute hardware compromise. Cervical spondylosis. No more than mild disc space narrowing. Multilevel disc bulges/central disc protrusions, facet arthrosis and ligamentum flavum hypertrophy. No appreciable high-grade spinal canal stenosis. No high-grade bony neural foraminal narrowing. Upper chest: No consolidation within the imaged lung apices. Biapical pleuroparenchymal scarring. Ill-defined opacity within the left upper lobe, minimally included in the field of view. IMPRESSION: CT head: 1. No evidence of acute intracranial abnormality. 2.  Moderate to advanced chronic small vessel ischemic changes within the cerebral white matter. 3. Generalized cerebral atrophy. 4. Paranasal sinus disease, as described. Correlate for acute sinusitis. CT cervical spine: 1. No evidence of acute fracture to the cervical  spine. 2. Trace C5-C6 and T1-T2 grade 1 anterolisthesis. 3. Prior C6-C7 ACDF.  No evidence of acute hardware compromise. 4. Cervical spondylosis, as described. 5. Ill-defined opacity within left upper lobe, only minima

## 2021-10-09 NOTE — Progress Notes (Signed)
?   10/09/21 2053  ?Vitals  ?Temp 97.9 ?F (36.6 ?C)  ?Temp Source Oral  ?BP (!) 113/98  ?MAP (mmHg) 105  ?BP Location Right Arm  ?BP Method Automatic  ?Patient Position (if appropriate) Lying  ?Pulse Rate (!) 112  ?Pulse Rate Source Monitor  ?Resp 18  ?MEWS COLOR  ?MEWS Score Color Yellow  ?Oxygen Therapy  ?SpO2 97 %  ?O2 Device Room Air  ?Pain Assessment  ?Pain Scale 0-10  ?Pain Score 0  ?MEWS Score  ?MEWS Temp 0  ?MEWS Systolic 0  ?MEWS Pulse 2  ?MEWS RR 0  ?MEWS LOC 0  ?MEWS Score 2  ?Provider Notification  ?Provider Name/Title Adefeso  ?Date Provider Notified 10/09/21  ?Time Provider Notified 2109  ?Method of Notification Page  ?Notification Reason Other (Comment) ?(HR 112, Patient just given scheduled Metoprolol '100mg'$  PO)  ? ? ?

## 2021-10-09 NOTE — Evaluation (Signed)
Occupational Therapy Evaluation ?Patient Details ?Name: Marcia Woods ?MRN: [id number] ?DOB: 03/20/1926 ?Today's Date: 10/09/2021 ? ? ?History of Present Illness Marcia Woods is a 88 y.o. female with medical history significant for atrial fibrillation on chronic anticoagulation with Eliquis.  Patient was brought to the ED via EMS with reports of a fall with complaints of right hip pain.  Patient lives alone, family check on her daily.  Patient reports she does not know why she fell.  She did not lose consciousness.  But she did hit her head.  She reports she was walking with her walker which she normally uses when suddenly her legs gave out and she was on the floor.  2 family members were already on the way to check on patient as they normally do patient had not been down for long.  She denies chest pain, no difficulty breathing, no palpitations. (Per MD.)   ? ?Clinical Impression ?  ?Pt. Agreeable to OT eval. Per pt, she was independent with ADLs before hospital stay. She lives alone and family checks on her daily. Pt. Reports that she ambulates within her home with use of RW. She stated that she did receive assistance with cooking meals occasionally. She does not drive. She required max assist for bed mobility going from supine to sitting EOB. Intermittent support from OT for EOB sitting balance.Unable to progress to standing from EOB due to pain level and decreased activity tolerance.  Required max assist for donning socks in bed. Pt. Would benefit from OT services during acute care stage.  ?    ? ?Recommendations for follow up therapy are one component of a multi-disciplinary discharge planning process, led by the attending physician.  Recommendations may be updated based on patient status, additional functional criteria and insurance authorization.  ? ?Follow Up Recommendations ? Skilled nursing-short term rehab (<3 hours/day)  ?  ?Assistance Recommended at Discharge Frequent or constant  Supervision/Assistance  ?Patient can return home with the following A lot of help with walking and/or transfers;Two people to help with walking and/or transfers;A lot of help with bathing/dressing/bathroom;Assistance with cooking/housework ? ?  ?Functional Status Assessment ? Patient has had a recent decline in their functional status and/or demonstrates limited ability to make significant improvements in function in a reasonable and predictable amount of time  ?Equipment Recommendations ? None recommended by OT  ?  ?Recommendations for Other Services   ? ? ?  ?Precautions / Restrictions Precautions ?Precautions: Fall ?Restrictions ?Weight Bearing Restrictions: Yes ?RLE Weight Bearing: Weight bearing as tolerated ?LLE Weight Bearing: Weight bearing as tolerated  ? ?  ? ?Mobility Bed Mobility ?Overal bed mobility: Needs Assistance ?Bed Mobility: Supine to Sit ?  ?  ?Supine to sit: Max assist ?  ?  ?General bed mobility comments: increased time, poor carryover for using BUE during supine to sitting. Able to lift BLE, unable to move BLE towards EOB ?  ? ?Transfers ?Overall transfer level: Needs assistance ?  ?  ?  ?  ?  ?  ?  ?  ?General transfer comment: Unable to perfrom transfers dues to pain and weakness ?  ? ?  ?Balance Overall balance assessment: Needs assistance ?Sitting-balance support: Feet supported, Bilateral upper extremity supported (intermittent support from OT) ?  ?Sitting balance - Comments: fair/poor seated at EOB ?Postural control: Left lateral lean ?  ?  ?  ?  ?  ?  ?  ?  ?  ?  ?  ?  ?  ?  ?   ? ?  ADL either performed or assessed with clinical judgement  ? ?ADL Overall ADL's : Needs assistance/impaired ?Eating/Feeding: Set up;Bed level ?  ?Grooming: Minimal assistance;Sitting ?  ?Upper Body Bathing: Sitting;Minimal assistance ?  ?Lower Body Bathing: Sitting/lateral leans;Maximal assistance ?  ?Upper Body Dressing : Set up;Minimal assistance;Sitting ?  ?Lower Body Dressing: Sitting/lateral  leans;Maximal assistance ?  ?Toilet Transfer: Maximal assistance ?  ?Toileting- Clothing Manipulation and Hygiene: Maximal assistance ?  ?Tub/ Shower Transfer: Maximal assistance ?  ?Functional mobility during ADLs: Rolling walker (2 wheels);Maximal assistance ?   ? ? ? ?Vision Patient Visual Report: No change from baseline ?Vision Assessment?: No apparent visual deficits  ?   ?   ?   ? ?Pertinent Vitals/Pain Pain Assessment ?Pain Assessment: 0-10 ?Pain Score: 6  ?Breathing: normal ?Negative Vocalization: none ?Facial Expression: smiling or inexpressive ?Consolability: no need to console ?Pain Location: right hip and LLE with pressure ?Pain Descriptors / Indicators: Grimacing, Guarding, Sore, Discomfort ?Pain Intervention(s): Limited activity within patient's tolerance, Monitored during session, Repositioned  ? ? ? ?Hand Dominance Right ?  ?Extremity/Trunk Assessment Upper Extremity Assessment ?Upper Extremity Assessment: Overall WFL for tasks assessed ?  ?Lower Extremity Assessment ?Lower Extremity Assessment: Defer to PT evaluation ?  ?Cervical / Trunk Assessment ?Cervical / Trunk Assessment: Normal ?  ?Communication Communication ?Communication: No difficulties ?  ?Cognition Arousal/Alertness: Awake/alert ?Behavior During Therapy: [facility] for tasks assessed/performed ?Overall Cognitive Status: Within Functional Limits for tasks assessed ?  ?  ?  ?  ?  ?  ?  ?  ?  ?  ?  ?  ?  ?  ?  ?  ?  ?  ?  ?General Comments  LLE oozing clear fluid. Changed out pad under legs and changed linens. ? ?  ?   ?   ? ? ?Home Living Family/patient expects to be discharged to:: Skilled nursing facility ?Living Arrangements: Alone ?Available Help at Discharge: Family;Available PRN/intermittently ?Type of Home: House ?Home Access: Stairs to enter ?Entrance Stairs-Number of Steps: 4 ?Entrance Stairs-Rails: Right;Left ?Home Layout: One level ?  ?  ?Bathroom Shower/Tub: Tub/shower unit ?  ?Bathroom Toilet: Handicapped height ?Bathroom  Accessibility: Yes ?  ?Home Equipment: Conservation officer, nature (2 wheels);Wheelchair - manual;Grab bars - tub/shower;Shower seat ?  ?  ?  ? ?  ?Prior Functioning/Environment Prior Level of Function : Independent/Modified Independent ?  ?  ?  ?  ?  ?  ?Mobility Comments: household ambulator using RW ?ADLs Comments: assisted by family ?  ? ?  ?  ?OT Problem List: Decreased strength;Decreased activity tolerance;Impaired balance (sitting and/or standing);Pain ?  ?   ?OT Treatment/Interventions: Self-care/ADL training;Therapeutic exercise;DME and/or AE instruction;Therapeutic activities;Patient/family education  ?  ?OT Goals(Current goals can be found in the care plan section) Acute Rehab OT Goals ?Patient Stated Goal: To get back home ?OT Goal Formulation: With patient ?Time For Goal Achievement: 10/23/21 ?Potential to Achieve Goals: Fair  ?OT Frequency: Min 1X/week ?  ? ?   ?  ?  ?  ?  ? ?  ?AM-PAC OT "6 Clicks" Daily Activity     ?Outcome Measure Help from another person eating meals?: A Little ?Help from another person taking care of personal grooming?: A Lot ?Help from another person toileting, which includes using toliet, bedpan, or urinal?: A Lot ?Help from another person bathing (including washing, rinsing, drying)?: A Lot ?Help from another person to put on and taking off regular upper body clothing?: A Lot ?Help from another person to put on  and taking off regular lower body clothing?: A Lot ?6 Click Score: 13 ?  ?End of Session   ? ?Activity Tolerance: Patient limited by pain ?Patient left: in bed;with call bell/phone within reach;with bed alarm set ? ?OT Visit Diagnosis: Muscle weakness (generalized) (M62.81);Pain ?Pain - Right/Left: Right ?Pain - part of body: Leg;Hip  ?              ?Time: 5027-7412 ?OT Time Calculation (min): 27 min ?Charges:  OT General Charges ?$OT Visit: 1 Visit ?OT Evaluation ?$OT Eval Low Complexity: 1 Low ? ?Arvil Persons, OTR/L ? ?Frederic Jericho ?10/09/2021, 10:51 AM ?

## 2021-10-09 NOTE — TOC Progression Note (Signed)
Transition of Care (TOC) - Progression Note  ? ? ?Patient Details  ?Name: RASHAWNDA GABA ?MRN: [id number] ?Date of Birth: 1926/02/20 ? ?Transition of Care (TOC) CM/SW Contact  ?Shade Flood, LCSW ?Phone Number: ?10/09/2021, 4:31 PM ? ?Clinical Narrative:    ? ?Pt's insurance authorized 7 days of SNF rehab as well as EMS transport to SNF. SNF auth number is [id number] and EMS auth number is [id number]. ? ?Expected Discharge Plan: [place] ?Barriers to Discharge: Continued Medical Work up ? ?Expected Discharge Plan and Services ?Expected Discharge Plan: [place] ?  ?  ?Post Acute Care Choice: [place] ?Living arrangements for the past 2 months: [place] ?                ?  ?  ?  ?  ?  ?  ?  ?  ?  ?  ? ? ?Social Determinants of Health (SDOH) Interventions ?  ? ?Readmission Risk Interventions ?   ? View : No data to display.  ?  ?  ?  ? ? ?

## 2021-10-09 NOTE — TOC Progression Note (Signed)
Transition of Care (TOC) - Progression Note  ? ? ?Patient Details  ?Name: Marcia Woods ?MRN: [id number] ?Date of Birth: [birth year] ? ?Transition of Care (TOC) CM/SW Contact  ?Boneta Lucks, RN ?Phone Number: ?10/09/2021, 1:26 PM ? ?Clinical Narrative:   INS AUTH started with [employer] for SNF and EMS.  Debbie at [facility] will admit over the weekend, if patient is medically ready.  ? ?Expected Discharge Plan: [place] ?Barriers to Discharge: Continued Medical Work up ? ?Expected Discharge Plan and Services ?Expected Discharge Plan: [place] ?  ?  ?Post Acute Care Choice: [place] ?Living arrangements for the past 2 months: [place] ?                ?  ?  ?  ?

## 2021-10-10 DIAGNOSIS — S32501A Unspecified fracture of right pubis, initial encounter for closed fracture: Secondary | ICD-10-CM | POA: Diagnosis not present

## 2021-10-10 DIAGNOSIS — Z9049 Acquired absence of other specified parts of digestive tract: Secondary | ICD-10-CM | POA: Diagnosis not present

## 2021-10-10 DIAGNOSIS — M6281 Muscle weakness (generalized): Secondary | ICD-10-CM | POA: Diagnosis not present

## 2021-10-10 DIAGNOSIS — F0390 Unspecified dementia without behavioral disturbance: Secondary | ICD-10-CM | POA: Diagnosis not present

## 2021-10-10 DIAGNOSIS — S329XXA Fracture of unspecified parts of lumbosacral spine and pelvis, initial encounter for closed fracture: Secondary | ICD-10-CM | POA: Diagnosis present

## 2021-10-10 DIAGNOSIS — S32591D Other specified fracture of right pubis, subsequent encounter for fracture with routine healing: Secondary | ICD-10-CM | POA: Diagnosis not present

## 2021-10-10 DIAGNOSIS — W1830XA Fall on same level, unspecified, initial encounter: Secondary | ICD-10-CM | POA: Diagnosis present

## 2021-10-10 DIAGNOSIS — Z9181 History of falling: Secondary | ICD-10-CM | POA: Diagnosis not present

## 2021-10-10 DIAGNOSIS — S32509A Unspecified fracture of unspecified pubis, initial encounter for closed fracture: Secondary | ICD-10-CM | POA: Diagnosis present

## 2021-10-10 DIAGNOSIS — L309 Dermatitis, unspecified: Secondary | ICD-10-CM | POA: Diagnosis present

## 2021-10-10 DIAGNOSIS — Y92009 Unspecified place in unspecified non-institutional (private) residence as the place of occurrence of the external cause: Secondary | ICD-10-CM | POA: Diagnosis not present

## 2021-10-10 DIAGNOSIS — H3530 Unspecified macular degeneration: Secondary | ICD-10-CM | POA: Diagnosis not present

## 2021-10-10 DIAGNOSIS — K219 Gastro-esophageal reflux disease without esophagitis: Secondary | ICD-10-CM | POA: Diagnosis not present

## 2021-10-10 DIAGNOSIS — R911 Solitary pulmonary nodule: Secondary | ICD-10-CM | POA: Diagnosis not present

## 2021-10-10 DIAGNOSIS — M158 Other polyosteoarthritis: Secondary | ICD-10-CM | POA: Diagnosis not present

## 2021-10-10 DIAGNOSIS — R2689 Other abnormalities of gait and mobility: Secondary | ICD-10-CM | POA: Diagnosis not present

## 2021-10-10 DIAGNOSIS — I4821 Permanent atrial fibrillation: Secondary | ICD-10-CM | POA: Diagnosis not present

## 2021-10-10 DIAGNOSIS — I4891 Unspecified atrial fibrillation: Secondary | ICD-10-CM | POA: Diagnosis not present

## 2021-10-10 DIAGNOSIS — Z885 Allergy status to narcotic agent status: Secondary | ICD-10-CM | POA: Diagnosis not present

## 2021-10-10 DIAGNOSIS — S32591A Other specified fracture of right pubis, initial encounter for closed fracture: Secondary | ICD-10-CM | POA: Diagnosis present

## 2021-10-10 DIAGNOSIS — R269 Unspecified abnormalities of gait and mobility: Secondary | ICD-10-CM | POA: Diagnosis not present

## 2021-10-10 DIAGNOSIS — Z79899 Other long term (current) drug therapy: Secondary | ICD-10-CM | POA: Diagnosis not present

## 2021-10-10 DIAGNOSIS — Z7901 Long term (current) use of anticoagulants: Secondary | ICD-10-CM | POA: Diagnosis not present

## 2021-10-10 DIAGNOSIS — F419 Anxiety disorder, unspecified: Secondary | ICD-10-CM | POA: Diagnosis present

## 2021-10-10 DIAGNOSIS — E785 Hyperlipidemia, unspecified: Secondary | ICD-10-CM | POA: Diagnosis present

## 2021-10-10 DIAGNOSIS — S32401D Unspecified fracture of right acetabulum, subsequent encounter for fracture with routine healing: Secondary | ICD-10-CM | POA: Diagnosis not present

## 2021-10-10 DIAGNOSIS — Z66 Do not resuscitate: Secondary | ICD-10-CM | POA: Diagnosis present

## 2021-10-10 LAB — BASIC METABOLIC PANEL
Anion gap: 6 (ref 5–15)
BUN: 16 mg/dL (ref 8–23)
CO2: 23 mmol/L (ref 22–32)
Calcium: 8.2 mg/dL — ABNORMAL LOW (ref 8.9–10.3)
Chloride: 102 mmol/L (ref 98–111)
Creatinine, Ser: 0.7 mg/dL (ref 0.44–1.00)
GFR, Estimated: >60 mL/min (ref >60)
Glucose, Bld: 114 mg/dL — ABNORMAL HIGH (ref 70–99)
Potassium: 3.9 mmol/L (ref 3.5–5.1)
Sodium: 131 mmol/L — ABNORMAL LOW (ref 135–145)

## 2021-10-10 LAB — MAGNESIUM: Magnesium: 2 mg/dL (ref 1.7–2.4)

## 2021-10-10 MED ORDER — DILTIAZEM HCL ER COATED BEADS 120 MG PO CP24
120.0000 mg | ORAL_CAPSULE | Freq: Every day | ORAL | Status: DC
  Administered 2021-10-10: 120 mg via ORAL
  Filled 2021-10-10: qty 1

## 2021-10-10 MED ORDER — DILTIAZEM HCL-DEXTROSE 125-5 MG/125ML-% IV SOLN (PREMIX)
5.0000 mg/h | INTRAVENOUS | Status: DC
  Administered 2021-10-10: 5 mg/h via INTRAVENOUS
  Administered 2021-10-11: 2.5 mg/h via INTRAVENOUS
  Filled 2021-10-10 (×3): qty 125

## 2021-10-10 MED ORDER — METOPROLOL TARTRATE 50 MG PO TABS
50.0000 mg | ORAL_TABLET | Freq: Two times a day (BID) | ORAL | Status: DC
Start: 2021-10-10 — End: 2021-10-10

## 2021-10-10 MED ORDER — CHLORHEXIDINE GLUCONATE CLOTH 2 % EX PADS
6.0000 | MEDICATED_PAD | Freq: Every day | CUTANEOUS | Status: DC
  Administered 2021-10-10 – 2021-10-12 (×3): 6 via TOPICAL

## 2021-10-10 MED ORDER — METOPROLOL TARTRATE 5 MG/5ML IV SOLN
5.0000 mg | INTRAVENOUS | Status: DC | PRN
  Administered 2021-10-10: 5 mg via INTRAVENOUS
  Filled 2021-10-10: qty 5

## 2021-10-10 NOTE — Progress Notes (Signed)
?   10/10/21 5027  ?Vitals  ?Temp 98.6 ?F (37 ?C)  ?Temp Source Oral  ?BP (!) 157/108  ?MAP (mmHg) 120  ?BP Location Right Arm  ?BP Method Automatic  ?Patient Position (if appropriate) Lying  ?Pulse Rate (!) 130  ?Resp 18  ?MEWS COLOR  ?MEWS Score Color Yellow  ?Oxygen Therapy  ?SpO2 96 %  ?O2 Device Room Air  ?Pain Assessment  ?Pain Scale 0-10  ?Pain Score 9  ?Pain Type Acute pain  ?Pain Location Hip  ?Pain Orientation Right  ?Pain Descriptors / Indicators Discomfort  ?Pain Frequency Constant  ?Pain Onset On-going  ?Pain Intervention(s) Medication (See eMAR)  ?Multiple Pain Sites No  ?MEWS Score  ?MEWS Temp 0  ?MEWS Systolic 0  ?MEWS Pulse 3  ?MEWS RR 0  ?MEWS LOC 0  ?MEWS Score 3  ?Provider Notification  ?Provider Name/Title Adefeso  ?Date Provider Notified 10/10/21  ?Time Provider Notified [phone]  ?Method of Notification Page  ?Notification Reason Other (Comment) ?(HR/BP elevated)  ? ?Patients heart rate running 130-140's, afib on telemetry. Patient given Metoprolol 5 mg IV for HR greater than 130. Patient also given prn pain medication. MD notified.  ?

## 2021-10-10 NOTE — Progress Notes (Signed)
Patient's daughter-in-law notified per patients request of transfer to SD/ ICU from medical-surgical unit for cardizem gtt.  Daughter-in-law voiced understanding. ?

## 2021-10-10 NOTE — Plan of Care (Signed)

## 2021-10-10 NOTE — Progress Notes (Signed)
?  PROGRESS NOTE ? ?Patient has been in A Fib RVR. We've been trying to manage by increasing her home medication doses, metoprolol and cardizem with PRN IV metoprolol. Despite efforts, her HR remains elevated. Patient started on IV cardizem drip and transferred to stepdown unit today for improved control.  ? ? ?Marcia Phi, DO ?[facility] ?10/10/2021, 6:58 PM ? ?Available via Epic secure chat 7am-7pm ?After these hours, please refer to coverage provider listed on [email or web address]  ? ?

## 2021-10-10 NOTE — Progress Notes (Addendum)
?PROGRESS NOTE ? ? ? ?Marcia Woods  JTT:[id number] DOB: [birth year] DOA: 10/07/2021 ?PCP: [name], MD  ? ?  ?Brief Narrative:  ?Marcia Woods is a 88 y.o. female with medical history significant for atrial fibrillation on chronic anticoagulation with Eliquis. Patient was brought to the ED via EMS with reports of a fall with complaints of right hip pain.  Patient lives alone, family check on her daily.  Patient reports she does not know why she fell.  She did not lose consciousness.  But she did hit her head.  She reports she was walking with her walker which she normally uses when suddenly her legs gave out and she was on the floor. Pelvic CT showed mild displaced fractures of the right inferior pubic ramus and right puboacetabular junction.  Orthopedic surgery recommended weightbearing as tolerated, pain control, nonsurgical management. ? ?New events last 24 hours / Subjective: ?Patient feeling well this morning, denies any pain at rest. ? ?Assessment & Plan: ?  ? ?Principal Problem: ?  Pubic bone fracture ([place]) ?Active Problems: ?  Atrial fibrillation ([place]) ?  Fall ?  Pelvic fracture ([facility]) ? ? ?Pelvic fracture status post mechanical fall ?-CT pelvis shows mild displaced fracture of right inferior pubic ramus and right puboacetabular junction ?-EDP talked with Dr. Regino Bellow, weightbearing as tolerated, pain control ?-PT recommending SNF placement ?-Pain control ? ?Chronic A-fib ?-Continue metoprolol, Eliquis.  Dose of metoprolol was increased. ?-Patient remains with elevated heart rate today.  Add Cardizem. She was on Cardizem at one point back in November 2021 per Dr. Myles Gip notes.  She had reported to pharmacy technician on admission that she is no longer taking it.  I am not sure when this was discontinued or why. Patient could not tell me and family didn't know either ? ?Dementia ?-Pt lives at home alone, but per family she has had some decline in her memory  ? ? ? ?DVT prophylaxis:  ? ?apixaban (ELIQUIS)  tablet 5 mg  ?Code Status: DNR ?Family Communication: Discussed with daughter-in-law over the phone  ?Disposition Plan:  ?Status is: Inpatient ?Remains inpatient appropriate because: Awaiting SNF placement, improved rate control of A-fib ? ? ? ?Antimicrobials:  ?Anti-infectives (From admission, onward)  ? ? None  ? ?  ? ? ? ?Objective: ?Vitals:  ? 10/09/21 1252 10/09/21 2053 10/09/21 2237 10/10/21 0092  ?BP: (!) 148/98 (!) 113/98 125/88 (!) 157/108  ?Pulse: (!) 110 (!) 112 (!) 110 (!) 130  ?Resp: '18 18 16 18  '$ ?Temp: 98.2 ?F (36.8 ?C) 97.9 ?F (36.6 ?C) 98.1 ?F (36.7 ?C) 98.6 ?F (37 ?C)  ?TempSrc:  Oral Oral Oral  ?SpO2: 97% 97% 95% 96%  ?Weight:      ?Height:      ? ? ?Intake/Output Summary (Last 24 hours) at 10/10/2021 1120 ?Last data filed at 10/10/2021 0900 ?Gross per 24 hour  ?Intake 780 ml  ?Output 1050 ml  ?Net -270 ml  ? ? ?Filed Weights  ? 10/07/21 1355  ?Weight: 65.8 kg  ? ? ?Examination:  ?General exam: Appears calm and comfortable  ?Respiratory system: Clear to auscultation. Respiratory effort normal. No respiratory distress. No conversational dyspnea.  ?Cardiovascular system: S1 & S2 heard, irregular rhythm, rate 120-130s. No murmurs. No pedal edema. ?Gastrointestinal system: Abdomen is nondistended, soft and nontender. Normal bowel sounds heard. ?Central nervous system: Alert and oriented. No focal neurological deficits. Speech clear.  ?Extremities: Symmetric in appearance  ?Skin: No rashes, lesions or ulcers on exposed skin  ?Psychiatry:  Judgement and insight appear normal. Mood & affect appropriate.  ? ?Data Reviewed: I have personally reviewed following labs and imaging studies ? ?CBC: ?Recent Labs  ?Lab 10/07/21 ?1449  ?WBC 7.4  ?NEUTROABS 6.2  ?HGB 13.0  ?HCT 42.0  ?MCV 93.3  ?PLT 173  ? ? ?Basic Metabolic Panel: ?Recent Labs  ?Lab 10/07/21 ?1449  ?NA 137  ?K 4.1  ?CL 104  ?CO2 22  ?GLUCOSE 102*  ?BUN 15  ?CREATININE 0.77  ?CALCIUM 9.3  ?MG 2.0  ? ? ?GFR: ?Estimated Creatinine Clearance: 38.1 mL/min  (by C-G formula based on SCr of 0.77 mg/dL). ?Liver Function Tests: ?No results for input(s): AST, ALT, ALKPHOS, BILITOT, PROT, ALBUMIN in the last 168 hours. ?No results for input(s): LIPASE, AMYLASE in the last 168 hours. ?No results for input(s): AMMONIA in the last 168 hours. ?Coagulation Profile: ?Recent Labs  ?Lab 10/07/21 ?1449  ?INR 1.5*  ? ? ?Cardiac Enzymes: ?No results for input(s): CKTOTAL, CKMB, CKMBINDEX, TROPONINI in the last 168 hours. ?BNP (last 3 results) ?No results for input(s): PROBNP in the last 8760 hours. ?HbA1C: ?No results for input(s): HGBA1C in the last 72 hours. ?CBG: ?No results for input(s): GLUCAP in the last 168 hours. ?Lipid Profile: ?No results for input(s): CHOL, HDL, LDLCALC, TRIG, CHOLHDL, LDLDIRECT in the last 72 hours. ?Thyroid Function Tests: ?No results for input(s): TSH, T4TOTAL, FREET4, T3FREE, THYROIDAB in the last 72 hours. ?Anemia Panel: ?No results for input(s): VITAMINB12, FOLATE, FERRITIN, TIBC, IRON, RETICCTPCT in the last 72 hours. ?Sepsis Labs: ?No results for input(s): PROCALCITON, LATICACIDVEN in the last 168 hours. ? ?No results found for this or any previous visit (from the past 240 hour(s)).  ? ? ?Radiology Studies: ?No results found. ? ? ? ?Scheduled Meds: ? apixaban  5 mg Oral BID  ? metoprolol tartrate  100 mg Oral BID  ? ?Continuous Infusions: ? ? LOS: 0 days  ? ? ? ?Dessa Phi, DO ?[facility] ?10/10/2021, 11:20 AM  ? ?Available via Epic secure chat 7am-7pm ?After these hours, please refer to coverage provider listed on [email or web address] ? ?

## 2021-10-11 ENCOUNTER — Inpatient Hospital Stay (HOSPITAL_COMMUNITY): Payer: PPO

## 2021-10-11 DIAGNOSIS — I4821 Permanent atrial fibrillation: Secondary | ICD-10-CM | POA: Diagnosis not present

## 2021-10-11 DIAGNOSIS — S32401D Unspecified fracture of right acetabulum, subsequent encounter for fracture with routine healing: Secondary | ICD-10-CM

## 2021-10-11 DIAGNOSIS — Z7901 Long term (current) use of anticoagulants: Secondary | ICD-10-CM

## 2021-10-11 DIAGNOSIS — I4891 Unspecified atrial fibrillation: Secondary | ICD-10-CM | POA: Diagnosis not present

## 2021-10-11 LAB — MAGNESIUM: Magnesium: 2 mg/dL (ref 1.7–2.4)

## 2021-10-11 LAB — ECHOCARDIOGRAM COMPLETE
Calc EF: 56.3 %
Height: 61 in
S' Lateral: 2.1 cm
Single Plane A2C EF: 59.9 %
Single Plane A4C EF: 53.2 %
Weight: 2292.78 oz

## 2021-10-11 LAB — GLUCOSE, CAPILLARY: Glucose-Capillary: 106 mg/dL — ABNORMAL HIGH (ref 70–99)

## 2021-10-11 LAB — TSH: TSH: 1.548 u[IU]/mL (ref 0.350–4.500)

## 2021-10-11 LAB — BASIC METABOLIC PANEL
Anion gap: 6 (ref 5–15)
BUN: 14 mg/dL (ref 8–23)
CO2: 27 mmol/L (ref 22–32)
Calcium: 8.7 mg/dL — ABNORMAL LOW (ref 8.9–10.3)
Chloride: 103 mmol/L (ref 98–111)
Creatinine, Ser: 0.65 mg/dL (ref 0.44–1.00)
GFR, Estimated: >60 mL/min (ref >60)
Glucose, Bld: 94 mg/dL (ref 70–99)
Potassium: 4 mmol/L (ref 3.5–5.1)
Sodium: 136 mmol/L (ref 135–145)

## 2021-10-11 LAB — CBC
HCT: 39.2 % (ref 36.0–46.0)
Hemoglobin: 12.4 g/dL (ref 12.0–15.0)
MCH: 29.5 pg (ref 26.0–34.0)
MCHC: 31.6 g/dL (ref 30.0–36.0)
MCV: 93.1 fL (ref 80.0–100.0)
Platelets: 194 10*3/uL (ref 150–400)
RBC: 4.21 MIL/uL (ref 3.87–5.11)
RDW: 14.1 % (ref 11.5–15.5)
WBC: 5.7 10*3/uL (ref 4.0–10.5)
nRBC: 0 % (ref 0.0–0.2)

## 2021-10-11 LAB — MRSA NEXT GEN BY PCR, NASAL: MRSA by PCR Next Gen: NOT DETECTED

## 2021-10-11 LAB — T4, FREE: Free T4: 1.25 ng/dL — ABNORMAL HIGH (ref 0.61–1.12)

## 2021-10-11 MED ORDER — METOPROLOL TARTRATE 25 MG PO TABS
25.0000 mg | ORAL_TABLET | Freq: Two times a day (BID) | ORAL | Status: DC
  Administered 2021-10-11 – 2021-10-13 (×5): 25 mg via ORAL
  Filled 2021-10-11 (×5): qty 1

## 2021-10-11 MED ORDER — DILTIAZEM HCL 30 MG PO TABS
30.0000 mg | ORAL_TABLET | Freq: Four times a day (QID) | ORAL | Status: DC
  Administered 2021-10-11 – 2021-10-12 (×3): 30 mg via ORAL
  Filled 2021-10-11 (×3): qty 1

## 2021-10-11 MED ORDER — HYDROCODONE-ACETAMINOPHEN 5-325 MG PO TABS: 1.0000 | ORAL_TABLET | Freq: Four times a day (QID) | ORAL | 0 refills | Status: DC | PRN

## 2021-10-11 NOTE — Progress Notes (Signed)
?  ?       ?PROGRESS NOTE ? ?Marcia Woods NIO:[id number] DOB: [birth year] DOA: 10/07/2021 ?PCP: [name], MD ? ?Brief History:  ?Marcia Woods is a 88 y.o. female with medical history significant for atrial fibrillation on chronic anticoagulation with Eliquis. Patient was brought to the ED via EMS with reports of a fall with complaints of right hip pain.  Patient lives alone, family check on her daily.  Patient reports she does not know why she fell.  She did not lose consciousness.  But she did hit her head.  She reports she was walking with her walker which she normally uses when suddenly her legs gave out and she was on the floor. Pelvic CT showed mild displaced fractures of the right inferior pubic ramus and right puboacetabular junction.  Orthopedic surgery recommended weightbearing as tolerated, pain control, nonsurgical management. ? ?During the hospitalization, the patient developed RVR with her permanent Afib.  Her metoprolol was initially titrated up to 100 mg bid, but her HR remained elevated.  So she was moved to SDU and started on diltiazem drip.  It was revealed that pt had stopped taking her home diltiazem for unclear reasons.  ? ? ?Assessment and Plan: ?* Pubic bone fracture (HCC) ?Mechanical fall with subsequent pubic bone fracture. ?-CT pelvis shows mild displaced fracture of right inferior pubic ramus and right puboacetabular junction. ?- EDP talked with Dr. Regino Bellow, weightbearing as tolerated, pain control ?-Hydrocodone/acetaminophen 10/22/2023 every 6 hourly as needed ?-PT evaluation ? ?Pelvic fracture ([facility]) ?-CT pelvis shows mild displaced fracture of right inferior pubic ramus and right puboacetabular junction ?-EDP talked with Dr. Regino Bellow, weightbearing as tolerated, pain control ?-PT recommending SNF placement ?-Pain control  ? ?Fall ?Fall at home while using her walker.  She reports last fall was months ago.  She denies frequent falls.  Head CT and cervical without acute abnormality. ?-PT  evaluation>>SNF ? ?Permanent atrial fibrillation ([place]) ?10/10/21--transferred to SDU and started on diltiazem drip ?HRs in 110s ?Restart metoprolol po ?Echo ?TSH 1.548 ?Continue apixaban ? ? ? ? ?Family Communication:   left VM for daughter in law ? ?Consultants:  none ? ?Code Status:  DNR ? ?DVT Prophylaxis: apixaban ? ? ?Procedures: ?As Listed in Progress Note Above ? ?Antibiotics: ?None ? ? ? ? ? ? ?Subjective: ?Patient denies fevers, chills, headache, chest pain, dyspnea, nausea, vomiting, diarrhea, abdominal pain, dysuria, hematuria, hematochezia, and melena. ? ? ?Objective: ?Vitals:  ? 10/11/21 0500 10/11/21 0600 10/11/21 0734 10/11/21 0800  ?BP: 110/62 125/64  (!) 139/96  ?Pulse: (!) 59 91 (!) 56 (!) 59  ?Resp: 14 (!) '21 15 13  '$ ?Temp:   98.5 ?F (36.9 ?C)   ?TempSrc:   Oral   ?SpO2: 90% 91% 94% 95%  ?Weight:      ?Height:      ? ? ?Intake/Output Summary (Last 24 hours) at 10/11/2021 0911 ?Last data filed at 10/11/2021 0400 ?Gross per 24 hour  ?Intake 482.53 ml  ?Output 600 ml  ?Net -117.47 ml  ? ?Weight change:  ?Exam: ? ?General:  Pt is alert, follows commands appropriately, not in acute distress ?HEENT: No icterus, No thrush, No neck mass, [place]/AT ?Cardiovascular: IRRR, S1/S2, no rubs, no gallops ?Respiratory: fine bibasilar crackles.  No wheeze ?Abdomen: Soft/+BS, non tender, non distended, no guarding ?Extremities: trace LE edema, No lymphangitis, No petechiae, No rashes, no synovitis ? ? ?Data Reviewed: ?I have personally reviewed following labs and imaging studies ?Basic Metabolic Panel: ?Recent Labs  ?  Lab 10/07/21 ?1449 10/10/21 ?2207 10/11/21 ?0744  ?NA 137 131* 136  ?K 4.1 3.9 4.0  ?CL 104 102 103  ?CO2 '22 23 27  '$ ?GLUCOSE 102* 114* 94  ?BUN '15 16 14  '$ ?CREATININE 0.77 0.70 0.65  ?CALCIUM 9.3 8.2* 8.7*  ?MG 2.0 2.0 2.0  ? ?Liver Function Tests: ?No results for input(s): AST, ALT, ALKPHOS, BILITOT, PROT, ALBUMIN in the last 168 hours. ?No results for input(s): LIPASE, AMYLASE in the last 168 hours. ?No  results for input(s): AMMONIA in the last 168 hours. ?Coagulation Profile: ?Recent Labs  ?Lab 10/07/21 ?1449  ?INR 1.5*  ? ?CBC: ?Recent Labs  ?Lab 10/07/21 ?1449 10/11/21 ?0744  ?WBC 7.4 5.7  ?NEUTROABS 6.2  --   ?HGB 13.0 12.4  ?HCT 42.0 39.2  ?MCV 93.3 93.1  ?PLT 173 194  ? ?Cardiac Enzymes: ?No results for input(s): CKTOTAL, CKMB, CKMBINDEX, TROPONINI in the last 168 hours. ?BNP: ?Invalid input(s): POCBNP ?CBG: ?Recent Labs  ?Lab 10/11/21 ?0020  ?GLUCAP 106*  ? ?HbA1C: ?No results for input(s): HGBA1C in the last 72 hours. ?Urine analysis: ?   ?Component Value Date/Time  ? COLORURINE STRAW (A) 09/03/2017 2112  ? APPEARANCEUR CLEAR 09/03/2017 2112  ? LABSPEC 1.004 (L) 09/03/2017 2112  ? PHURINE 7.0 09/03/2017 2112  ? [place] NEGATIVE 09/03/2017 2112  ? [place] NEGATIVE 09/03/2017 2112  ? [place] NEGATIVE 09/03/2017 2112  ? [place] NEGATIVE 09/03/2017 2112  ? [place] NEGATIVE 09/03/2017 2112  ? UROBILINOGEN 0.2 10/31/2012 1019  ? NITRITE NEGATIVE 09/03/2017 2112  ? LEUKOCYTESUR TRACE (A) 09/03/2017 2112  ? ?Sepsis Labs: ?'@LABRCNTIP'$ (procalcitonin:4,lacticidven:4) ?) ?Recent Results (from the past 240 hour(s))  ?MRSA Next Gen by PCR, Nasal     Status: None  ? Collection Time: 10/10/21  6:11 PM  ? Specimen: Nasal Mucosa; Nasal Swab  ?Result Value Ref Range Status  ? MRSA by PCR Next Gen NOT DETECTED NOT DETECTED Final  ?  Comment: (NOTE) ?The GeneXpert MRSA Assay (FDA approved for NASAL specimens only), ?is one component of a comprehensive MRSA colonization surveillance ?program. It is not intended to diagnose MRSA infection nor to guide ?or monitor treatment for MRSA infections. ?Test performance is not FDA approved in patients less than 2 years ?old. ?Performed at [facility], [address], [place], [place] [address] ?  ?  ? ?Scheduled Meds: ? apixaban  5 mg Oral BID  ? Chlorhexidine Gluconate Cloth  6 each Topical Daily  ? metoprolol tartrate  25 mg Oral BID  ? ?Continuous Infusions: ? diltiazem  (CARDIZEM) infusion 5 mg/hr (10/10/21 1821)  ? ? ?Procedures/Studies: ?CT Head Wo Contrast ? ?Result Date: 10/07/2021 ?CLINICAL DATA:  Head trauma, Minor. Anticoagulated with Eliquis. Additional history provided: Fall (hitting back of head). Neck trauma. EXAM: CT HEAD WITHOUT CONTRAST CT CERVICAL SPINE WITHOUT CONTRAST TECHNIQUE: Multidetector CT imaging of the head and cervical spine was performed following the standard protocol without intravenous contrast. Multiplanar CT image reconstructions of the cervical spine were also generated. RADIATION DOSE REDUCTION: This exam was performed according to the departmental dose-optimization program which includes automated exposure control, adjustment of the mA and/or kV according to patient size and/or use of iterative reconstruction technique. COMPARISON:  Prior head CT examinations 09/03/2017 and earlier. Report from radiographs of the cervical spine 04/27/2001 (images unavailable). FINDINGS: CT HEAD FINDINGS Brain: Generalized cerebral atrophy. Commensurate prominence of the ventricles and sulci. Moderate to advanced patchy and confluent hypoattenuation within the cerebral white matter, nonspecific but compatible with chronic small vessel ischemic disease.  There is no acute intracranial hemorrhage. No demarcated cortical infarct. No extra-axial fluid collection. No evidence of an intracranial mass. No midline shift. Vascular: No hyperdense vessel.  Atherosclerotic calcifications. Skull: Normal. Negative for fracture or focal lesion. Sinuses/Orbits: Visualized orbits show no acute finding. Mild-to-moderate mucosal thickening within the inferior right maxillary sinus. Moderate-sized fluid level, and background mild mucosal thickening, within the imaged left maxillary sinus. Moderate mucosal thickening within the bilateral ethmoid sinuses. Mild mucosal thickening within the frontoethmoidal recesses, bilaterally. CT CERVICAL SPINE FINDINGS Alignment: Trace C5-C6 and T1-T2  grade 1 anterolisthesis. Skull base and vertebrae: The basion-dental and atlanto-dental intervals are maintained.No evidence of acute fracture to the cervical spine. Soft tissues and spinal canal: No prevertebral flui

## 2021-10-11 NOTE — Assessment & Plan Note (Addendum)
-  CT pelvis shows mild displaced fracture of right inferior pubic ramus and right puboacetabular junction ?-EDP?talked with Dr. Colon Branch, weightbearing as tolerated, pain control ?-PT recommending SNF placement ?-Pain control  ?

## 2021-10-11 NOTE — Plan of Care (Signed)

## 2021-10-11 NOTE — Progress Notes (Signed)
?  Echocardiogram ?2D Echocardiogram has been performed. ? ?Marcia Woods ?10/11/2021, 3:27 PM ?

## 2021-10-11 NOTE — Discharge Summary (Signed)
?Physician Discharge Summary ?  ?Patient: Marcia Woods MRN: [id number] DOB: August 10, 1925  ?Admit date:     10/07/2021  ?Discharge date: 10/13/2021  ?Discharge Physician: Shanon Brow Dynisha Due  ? ?PCP: [name], MD  ? ?Recommendations at discharge:  ? Please follow up with primary care provider within 1-2 weeks ? Please repeat BMP and CBC in one week ? ? ? ? ? ?Hospital Course: ?Marcia Woods is a 88 y.o. female with medical history significant for atrial fibrillation on chronic anticoagulation with Eliquis. Patient was brought to the ED via EMS with reports of a fall with complaints of right hip pain.  Patient lives alone, family check on her daily.  Patient reports she does not know why she fell.  She did not lose consciousness.  But she did hit her head.  She reports she was walking with her walker which she normally uses when suddenly her legs gave out and she was on the floor. Pelvic CT showed mild displaced fractures of the right inferior pubic ramus and right puboacetabular junction.  Orthopedic surgery recommended weightbearing as tolerated, pain control, nonsurgical management. ? ?During the hospitalization, the patient developed RVR with her permanent Afib.  Her metoprolol was initially titrated up to 100 mg bid, but her HR remained elevated.  So she was moved to SDU and started on diltiazem drip.  It was revealed that pt had stopped taking her home diltiazem for unclear reasons. ? ?Assessment and Plan: ?Pelvic fracture ([place]) ?-CT pelvis shows mild displaced fracture of right inferior pubic ramus and right puboacetabular junction ?-EDP talked with Dr. Colon Branch, weightbearing as tolerated, pain control ?-PT recommending SNF placement ?-Pain control  ? ?Fall ?Fall at home while using her walker.  She reports last fall was months ago.  She denies frequent falls.  Head CT and cervical without acute abnormality. ?-PT evaluation>>SNF ? ?Permanent atrial fibrillation ([place]) ?10/10/21--transferred to SDU and started on  diltiazem drip ?Transitioned to po diltiazem>>increase dose to 60 q 6 ?Restart metoprolol po ?4/23 Echo EF 65-70%, no WMA, RVSP 43.7; moderate pericardial eff without tamponade ?TSH 1.548 ?Continue apixaban ? ? ? ? ?  ? ?Pain control - [employer] Controlled Substance Reporting System database was reviewed. and patient was instructed, not to drive, operate heavy machinery, perform activities at heights, swimming or participation in water activities or provide baby-sitting services while on Pain, Sleep and Anxiety Medications; until their outpatient Physician has advised to do so again. Also recommended to not to take more than prescribed Pain, Sleep and Anxiety Medications.  ?Consultants: ortho ?Procedures performed: none  ?Disposition: Skilled nursing facility ?Diet recommendation:  ?Regular diet ?DISCHARGE MEDICATION: ?Allergies as of 10/13/2021   ? ?   Reactions  ? Codeine Nausea And Vomiting  ? ?  ? ?  ?Medication List  ?  ? ?STOP taking these medications   ? ?cephALEXin 250 MG capsule ?Commonly known as: KEFLEX ?  ? ?  ? ?TAKE these medications   ? ?apixaban 5 MG Tabs tablet ?Commonly known as: Eliquis ?Take 1 tablet (5 mg total) by mouth 2 (two) times daily. ?  ?diltiazem 240 MG 24 hr capsule ?Commonly known as: CARDIZEM CD ?TAKE (1) CAPSULE BY MOUTH ONCE DAILY. ?  ?furosemide 20 MG tablet ?Commonly known as: LASIX ?TAKE (1) TABLET BY MOUTH ONCE DAILY. ?What changed: See the new instructions. ?  ?HYDROcodone-acetaminophen 5-325 MG tablet ?Commonly known as: NORCO/VICODIN ?Take 1 tablet by mouth every 6 (six) hours as needed for moderate pain or severe  pain. ?  ?metoprolol tartrate 25 MG tablet ?Commonly known as: LOPRESSOR ?Take 1 tablet (25 mg total) by mouth 2 (two) times daily. ?What changed:  ?medication strength ?See the new instructions. ?  ? ?  ? ? Contact information for follow-up providers   ? ? Mordecai Rasmussen, MD Follow up in 2 week(s).   ?Specialties: Orthopedic Surgery, Sports Medicine ?Contact  information: ?[address] [place] [address] ?[phone] ? ? ?  ?  ? ?  ?  ? ? Contact information for after-discharge care   ? ? Destination   ? ? [facility] .   ?Service: Skilled Nursing ?Contact information: ?[place] ?[place] ?[phone] ? ?  ?  ? ?  ?  ? ?  ?  ? ?  ? ?Discharge Exam: ?Filed Weights  ? 10/11/21 0400 10/12/21 0500 10/13/21 0500  ?Weight: 65 kg 64.3 kg 63.2 kg  ?HEENT:  [place]/AT, No thrush, no icterus ?CV:  IRRR, no rub, no S3, no S4 ?Lung:  fine bibasilar rales.  No wheeze ?Abd:  soft/+BS, NT ?Ext:  No edema, no lymphangitis, no synovitis, no rash ? ? ?Condition at discharge: stable ? ?The results of significant diagnostics from this hospitalization (including imaging, microbiology, ancillary and laboratory) are listed below for reference.  ? ?Imaging Studies: ?CT Head Wo Contrast ? ?Result Date: 10/07/2021 ?CLINICAL DATA:  Head trauma, Minor. Anticoagulated with Eliquis. Additional history provided: Fall (hitting back of head). Neck trauma. EXAM: CT HEAD WITHOUT CONTRAST CT CERVICAL SPINE WITHOUT CONTRAST TECHNIQUE: Multidetector CT imaging of the head and cervical spine was performed following the standard protocol without intravenous contrast. Multiplanar CT image reconstructions of the cervical spine were also generated. RADIATION DOSE REDUCTION: This exam was performed according to the departmental dose-optimization program which includes automated exposure control, adjustment of the mA and/or kV according to patient size and/or use of iterative reconstruction technique. COMPARISON:  Prior head CT examinations 09/03/2017 and earlier. Report from radiographs of the cervical spine 04/27/2001 (images unavailable). FINDINGS: CT HEAD FINDINGS Brain: Generalized cerebral atrophy. Commensurate prominence of the ventricles and sulci. Moderate to advanced patchy and confluent hypoattenuation within the cerebral white matter,  nonspecific but compatible with chronic small vessel ischemic disease. There is no acute intracranial hemorrhage. No demarcated cortical infarct. No extra-axial fluid collection. No evidence of an intracranial mass. No midline shift. Vascular: No hyperdense vessel.  Atherosclerotic calcifications. Skull: Normal. Negative for fracture or focal lesion. Sinuses/Orbits: Visualized orbits show no acute finding. Mild-to-moderate mucosal thickening within the inferior right maxillary sinus. Moderate-sized fluid level, and background mild mucosal thickening, within the imaged left maxillary sinus. Moderate mucosal thickening within the bilateral ethmoid sinuses. Mild mucosal thickening within the frontoethmoidal recesses, bilaterally. CT CERVICAL SPINE FINDINGS Alignment: Trace C5-C6 and T1-T2 grade 1 anterolisthesis. Skull base and vertebrae: The basion-dental and atlanto-dental intervals are maintained.No evidence of acute fracture to the cervical spine. Soft tissues and spinal canal: No prevertebral fluid or swelling. No visible canal hematoma. Disc levels: Prior C6-C7 ACDF.  No evidence of acute hardware compromise. Cervical spondylosis. No more than mild disc space narrowing. Multilevel disc bulges/central disc protrusions, facet arthrosis and ligamentum flavum hypertrophy. No appreciable high-grade spinal canal stenosis. No high-grade bony neural foraminal narrowing. Upper chest: No consolidation within the imaged lung apices. Biapical pleuroparenchymal scarring. Ill-defined opacity within the left upper lobe, minimally included in the field of view. IMPRESSION: CT head: 1. No evidence of acute intracranial abnormality. 2. Moderate  to advanced chronic small vessel ischemic changes within the cerebral white matter. 3. Generalized cerebral atrophy. 4. Paranasal sinus disease, as described. Correlate for acute sinusitis. CT cervical spine: 1. No evidence of acute fracture to the cervical spine. 2. Trace C5-C6 and T1-T2  grade 1 anterolisthesis. 3. Prior C6-C7 ACDF.  No evidence of acute hardware compromise. 4. Cervical spondylosis, as described. 5. Ill-defined opacity within left upper lobe, only minimally included in the field of v

## 2021-10-11 NOTE — Hospital Course (Signed)
Marcia Woods is a 88 y.o. female with medical history significant for atrial fibrillation on chronic anticoagulation with Eliquis. Patient was brought to the ED via EMS with reports of a fall with complaints of right hip pain.  Patient lives alone, family check on her daily.  Patient reports she does not know why she fell.  She did not lose consciousness.  But she did hit her head.  She reports she was walking with her walker which she normally uses when suddenly her legs gave out and she was on the floor. Pelvic CT showed mild displaced fractures of the right inferior pubic ramus and right puboacetabular junction.  Orthopedic surgery recommended weightbearing as tolerated, pain control, nonsurgical management. ? ?During the hospitalization, the patient developed RVR with her permanent Afib.  Her metoprolol was initially titrated up to 100 mg bid, but her HR remained elevated.  So she was moved to SDU and started on diltiazem drip.  It was revealed that pt had stopped taking her home diltiazem for unclear reasons. ?

## 2021-10-12 DIAGNOSIS — I4821 Permanent atrial fibrillation: Secondary | ICD-10-CM | POA: Diagnosis not present

## 2021-10-12 MED ORDER — DILTIAZEM HCL 60 MG PO TABS
60.0000 mg | ORAL_TABLET | Freq: Four times a day (QID) | ORAL | Status: DC
  Administered 2021-10-12 – 2021-10-13 (×4): 60 mg via ORAL
  Filled 2021-10-12 (×4): qty 1

## 2021-10-12 NOTE — Progress Notes (Signed)
?  ?       ?PROGRESS NOTE ? ?Marcia Woods:[id number] DOB: Aug 03, 1925 DOA: 10/07/2021 ?PCP: [name], MD ? ?Brief History:  ?Marcia Woods is a 88 y.o. female with medical history significant for atrial fibrillation on chronic anticoagulation with Eliquis. Patient was brought to the ED via EMS with reports of a fall with complaints of right hip pain.  Patient lives alone, family check on her daily.  Patient reports she does not know why she fell.  She did not lose consciousness.  But she did hit her head.  She reports she was walking with her walker which she normally uses when suddenly her legs gave out and she was on the floor. Pelvic CT showed mild displaced fractures of the right inferior pubic ramus and right puboacetabular junction.  Orthopedic surgery recommended weightbearing as tolerated, pain control, nonsurgical management. ? ?During the hospitalization, the patient developed RVR with her permanent Afib.  Her metoprolol was initially titrated up to 100 mg bid, but her HR remained elevated.  So she was moved to SDU and started on diltiazem drip.  It was revealed that pt had stopped taking her home diltiazem for unclear reasons.  ? ? ?Assessment and Plan: ?Pelvic fracture ([place]) ?-CT pelvis shows mild displaced fracture of right inferior pubic ramus and right puboacetabular junction ?-EDP talked with Dr. Regino Bellow, weightbearing as tolerated, pain control ?-PT recommending SNF placement ?-Pain control  ? ?Fall ?Fall at home while using her walker.  She reports last fall was months ago.  She denies frequent falls.  Head CT and cervical without acute abnormality. ?-PT evaluation>>SNF ? ?Permanent atrial fibrillation ([place]) ?10/10/21--transferred to SDU and started on diltiazem drip ?Transitioned to po diltiazem>>increase dose to 60 q 6 ?Restart metoprolol po ?4/23 Echo EF 65-70%, no WMA, RVSP 43.7; moderate pericardial eff without tamponade ?TSH 1.548 ?Continue apixaban ? ?Family Communication:   left VM  for daughter in law ?  ?Consultants:  none ?  ?Code Status:  DNR ?  ?DVT Prophylaxis: apixaban ?  ?  ?Procedures: ?As Listed in Progress Note Above ?  ?Antibiotics: ?None ?  ?  ? ? ? ? ?Subjective: ?Patient denies fevers, chills, headache, chest pain, dyspnea, nausea, vomiting, diarrhea, abdominal pain, dysuria, hematuria, hematochezia, and melena. ? ? ?Objective: ?Vitals:  ? 10/12/21 0800 10/12/21 1000 10/12/21 1030 10/12/21 1033  ?BP: 127/81 112/63    ?Pulse: (!) 119 (!) 129 78 93  ?Resp: '12 19 17 14  '$ ?Temp:      ?TempSrc:      ?SpO2: 90% 92% 93% 93%  ?Weight:      ?Height:      ? ? ?Intake/Output Summary (Last 24 hours) at 10/12/2021 1112 ?Last data filed at 10/12/2021 0847 ?Gross per 24 hour  ?Intake 321.22 ml  ?Output 1000 ml  ?Net -678.78 ml  ? ?Weight change: -0.7 kg ?Exam: ? ?General:  Pt is alert, follows commands appropriately, not in acute distress ?HEENT: No icterus, No thrush, No neck mass, [place]/AT ?Cardiovascular: IRRR, S1/S2, no rubs, no gallops ?Respiratory: fine bibasilar crackles. No wheeze ?Abdomen: Soft/+BS, non tender, non distended, no guarding ?Extremities: trace LE edema, No lymphangitis, No petechiae, No rashes, no synovitis ? ? ?Data Reviewed: ?I have personally reviewed following labs and imaging studies ?Basic Metabolic Panel: ?Recent Labs  ?Lab 10/07/21 ?1449 10/10/21 ?2207 10/11/21 ?0744  ?NA 137 131* 136  ?K 4.1 3.9 4.0  ?CL 104 102 103  ?CO2 '22 23 27  '$ ?GLUCOSE 102* 114*  94  ?BUN '15 16 14  '$ ?CREATININE 0.77 0.70 0.65  ?CALCIUM 9.3 8.2* 8.7*  ?MG 2.0 2.0 2.0  ? ?Liver Function Tests: ?No results for input(s): AST, ALT, ALKPHOS, BILITOT, PROT, ALBUMIN in the last 168 hours. ?No results for input(s): LIPASE, AMYLASE in the last 168 hours. ?No results for input(s): AMMONIA in the last 168 hours. ?Coagulation Profile: ?Recent Labs  ?Lab 10/07/21 ?1449  ?INR 1.5*  ? ?CBC: ?Recent Labs  ?Lab 10/07/21 ?1449 10/11/21 ?0744  ?WBC 7.4 5.7  ?NEUTROABS 6.2  --   ?HGB 13.0 12.4  ?HCT 42.0 39.2  ?MCV 93.3  93.1  ?PLT 173 194  ? ?Cardiac Enzymes: ?No results for input(s): CKTOTAL, CKMB, CKMBINDEX, TROPONINI in the last 168 hours. ?BNP: ?Invalid input(s): POCBNP ?CBG: ?Recent Labs  ?Lab 10/11/21 ?0020  ?GLUCAP 106*  ? ?HbA1C: ?No results for input(s): HGBA1C in the last 72 hours. ?Urine analysis: ?   ?Component Value Date/Time  ? COLORURINE STRAW (A) 09/03/2017 2112  ? APPEARANCEUR CLEAR 09/03/2017 2112  ? LABSPEC 1.004 (L) 09/03/2017 2112  ? PHURINE 7.0 09/03/2017 2112  ? [place] NEGATIVE 09/03/2017 2112  ? [place] NEGATIVE 09/03/2017 2112  ? [place] NEGATIVE 09/03/2017 2112  ? [place] NEGATIVE 09/03/2017 2112  ? [place] NEGATIVE 09/03/2017 2112  ? UROBILINOGEN 0.2 10/31/2012 1019  ? NITRITE NEGATIVE 09/03/2017 2112  ? LEUKOCYTESUR TRACE (A) 09/03/2017 2112  ? ?Sepsis Labs: ?'@LABRCNTIP'$ (procalcitonin:4,lacticidven:4) ?) ?Recent Results (from the past 240 hour(s))  ?MRSA Next Gen by PCR, Nasal     Status: None  ? Collection Time: 10/10/21  6:11 PM  ? Specimen: Nasal Mucosa; Nasal Swab  ?Result Value Ref Range Status  ? MRSA by PCR Next Gen NOT DETECTED NOT DETECTED Final  ?  Comment: (NOTE) ?The GeneXpert MRSA Assay (FDA approved for NASAL specimens only), ?is one component of a comprehensive MRSA colonization surveillance ?program. It is not intended to diagnose MRSA infection nor to guide ?or monitor treatment for MRSA infections. ?Test performance is not FDA approved in patients less than 2 years ?old. ?Performed at [facility], [address] [place], [place] [address] ?  ?  ? ?Scheduled Meds: ? apixaban  5 mg Oral BID  ? Chlorhexidine Gluconate Cloth  6 each Topical Daily  ? diltiazem  60 mg Oral Q6H  ? metoprolol tartrate  25 mg Oral BID  ? ?Continuous Infusions: ? ?Procedures/Studies: ?CT Head Wo Contrast ? ?Result Date: 10/07/2021 ?CLINICAL DATA:  Head trauma, Minor. Anticoagulated with Eliquis. Additional history provided: Fall (hitting back of head). Neck trauma. EXAM: CT HEAD WITHOUT CONTRAST CT  CERVICAL SPINE WITHOUT CONTRAST TECHNIQUE: Multidetector CT imaging of the head and cervical spine was performed following the standard protocol without intravenous contrast. Multiplanar CT image reconstructions of the cervical spine were also generated. RADIATION DOSE REDUCTION: This exam was performed according to the departmental dose-optimization program which includes automated exposure control, adjustment of the mA and/or kV according to patient size and/or use of iterative reconstruction technique. COMPARISON:  Prior head CT examinations 09/03/2017 and earlier. Report from radiographs of the cervical spine 04/27/2001 (images unavailable). FINDINGS: CT HEAD FINDINGS Brain: Generalized cerebral atrophy. Commensurate prominence of the ventricles and sulci. Moderate to advanced patchy and confluent hypoattenuation within the cerebral white matter, nonspecific but compatible with chronic small vessel ischemic disease. There is no acute intracranial hemorrhage. No demarcated cortical infarct. No extra-axial fluid collection. No evidence of an intracranial mass. No midline shift. Vascular: No hyperdense vessel.  Atherosclerotic calcifications. Skull: Normal. Negative  for fracture or focal lesion. Sinuses/Orbits: Visualized orbits show no acute finding. Mild-to-moderate mucosal thickening within the inferior right maxillary sinus. Moderate-sized fluid level, and background mild mucosal thickening, within the imaged left maxillary sinus. Moderate mucosal thickening within the bilateral ethmoid sinuses. Mild mucosal thickening within the frontoethmoidal recesses, bilaterally. CT CERVICAL SPINE FINDINGS Alignment: Trace C5-C6 and T1-T2 grade 1 anterolisthesis. Skull base and vertebrae: The basion-dental and atlanto-dental intervals are maintained.No evidence of acute fracture to the cervical spine. Soft tissues and spinal canal: No prevertebral fluid or swelling. No visible canal hematoma. Disc levels: Prior C6-C7 ACDF.   No evidence of acute hardware compromise. Cervical spondylosis. No more than mild disc space narrowing. Multilevel disc bulges/central disc protrusions, facet arthrosis and ligamentum flavum hypertrophy. No

## 2021-10-12 NOTE — Plan of Care (Signed)

## 2021-10-12 NOTE — Progress Notes (Signed)
Physical Therapy Treatment/Re-Assessment ?Patient Details ?Name: Marcia Woods ?MRN: [id number] ?DOB: 1925-12-11 ?Today's Date: 10/12/2021 ? ? ?History of Present Illness Marcia Woods is a 88 y.o. female with medical history significant for atrial fibrillation on chronic anticoagulation with Eliquis.  Patient was brought to the ED via EMS with reports of a fall with complaints of right hip pain.  Patient lives alone, family check on her daily.  Patient reports she does not know why she fell.  She did not lose consciousness.  But she did hit her head.  She reports she was walking with her walker which she normally uses when suddenly her legs gave out and she was on the floor.  2 family members were already on the way to check on patient as they normally do patient had not been down for long.  She denies chest pain, no difficulty breathing, no palpitations. ? ?  ?PT Comments  ? ? Patient sitting in recliner upon therapist entry. Patient agreeable to participating PT session today. Patient performed sitting level exercises for upper and lower extremities with multimodal cuing.  Patient was assisted for sitting to standing x2 requiring multimodal cuing and moderate to maximal assistance. Patient was unable to fully stand and move both hands to RW from chair armrests. Patient agreeable to remaining in chair at end of session.  Patient would continue to benefit from skilled physical therapy in current environment and next venue to continue return to prior function and increase strength, endurance, balance, coordination, and functional mobility and gait skills.  ?  ?Recommendations for follow up therapy are one component of a multi-disciplinary discharge planning process, led by the attending physician.  Recommendations may be updated based on patient status, additional functional criteria and insurance authorization. ? ?Follow Up Recommendations ? Skilled nursing-short term rehab (<3 hours/day) ?  ?  ?Assistance  Recommended at Discharge Intermittent Supervision/Assistance  ?Patient can return home with the following A lot of help with bathing/dressing/bathroom;A lot of help with walking and/or transfers;Help with stairs or ramp for entrance;Assistance with cooking/housework ?  ?Equipment Recommendations ? None recommended by PT  ?  ?Recommendations for Other Services   ? ? ?  ?Precautions / Restrictions Precautions ?Precautions: Fall ?Restrictions ?Weight Bearing Restrictions: Yes ?RLE Weight Bearing: Weight bearing as tolerated ?LLE Weight Bearing: Weight bearing as tolerated  ?  ? ?Mobility ? Bed Mobility ?  ?  ?General bed mobility comments: patient in chair at beginning and end of session ?  ? ?Transfers ?Overall transfer level: Needs assistance ?Equipment used: Rolling walker (2 wheels), None, 1 person hand held assist ?Transfers: Sit to/from Stand ?Sit to Stand: Max assist ?  ?General transfer comment: stood with RW, but unable to take steps, required Max assist stand pivot with LLE blocked ?  ? ?Ambulation/Gait ?  ?  ?General Gait Details: not attempted ? ? ?Stairs ?  ? ? ?Wheelchair Mobility ?  ? ?Modified Rankin (Stroke Patients Only) ?  ? ? ?  ?Balance Overall balance assessment: Needs assistance ?Sitting-balance support: Feet supported, No upper extremity supported ?Sitting balance-Leahy Scale: Fair ?Sitting balance - Comments: fair seated in chair ?  ?Standing balance support: Reliant on assistive device for balance, During functional activity, Bilateral upper extremity supported ?Standing balance-Leahy Scale: Zero ?Standing balance comment: using RW ?  ?  ?  ? ?  ?Cognition Arousal/Alertness: Awake/alert ?Behavior During Therapy: [facility] for tasks assessed/performed ?Overall Cognitive Status: Within Functional Limits for tasks assessed ?  ?  ? ?  ?Exercises  General Exercises - Upper Extremity ?Shoulder Flexion: AROM, Strengthening, Both, 10 reps, Seated ?General Exercises - Lower Extremity ?Long Arc Quad: AROM,  Strengthening, Both, 10 reps, Seated ?Hip Flexion/Marching: AROM, Strengthening, Both, 10 reps, Seated ?Toe Raises: AROM, Strengthening, Both, 10 reps, Seated ?Heel Raises: AROM, Strengthening, Both, 10 reps, Seated ? ?  ?General Comments   ?  ?  ? ?Pertinent Vitals/Pain Pain Assessment ?Pain Assessment: No/denies pain ?Pain Location: pain with movement of right leg  ? ? ?Home Living   ?  ?  ?  ?Prior Function    ?   ? ?PT Goals (current goals can now be found in the care plan section) Acute Rehab PT Goals ?Patient Stated Goal: return home after rehab ?PT Goal Formulation: With patient/family ?Time For Goal Achievement: 10/22/21 ?Potential to Achieve Goals: Good ?Progress towards PT goals: Progressing toward goals ? ?  ?Frequency ? ? ? Min 3X/week ? ? ? ?  ?PT Plan Current plan remains appropriate  ? ? ?   ?AM-PAC PT "6 Clicks" Mobility   ?Outcome Measure ? Help needed turning from your back to your side while in a flat bed without using bedrails?: A Lot ?Help needed moving from lying on your back to sitting on the side of a flat bed without using bedrails?: A Lot ?Help needed moving to and from a bed to a chair (including a wheelchair)?: A Lot ?Help needed standing up from a chair using your arms (e.g., wheelchair or bedside chair)?: A Lot ?Help needed to walk in hospital room?: Total ?Help needed climbing 3-5 steps with a railing? : Total ?6 Click Score: 10 ? ?  ?End of Session Equipment Utilized During Treatment: Gait belt ?Activity Tolerance: Patient tolerated treatment well;Patient limited by fatigue;Patient limited by pain ?Patient left: in chair;with chair alarm set;with call bell/phone within reach ?Nurse Communication: Mobility status ?PT Visit Diagnosis: Unsteadiness on feet (R26.81);Other abnormalities of gait and mobility (R26.89);Muscle weakness (generalized) (M62.81) ?  ? ? ?Time: 1610-9604 ?PT Time Calculation (min) (ACUTE ONLY): 23 min ? ?Charges:  $Therapeutic Exercise: 8-22 mins ?$Therapeutic  Activity: 8-22 mins          ?          ? ?Floria Raveling. Hartnett-Rands, MS, PT ?Per [place] ?[place] #[id number] ? ?Jakeb Lamping  Hartnett-Rands ?10/12/2021, 1:42 PM ? ?

## 2021-10-12 NOTE — TOC Progression Note (Signed)
Transition of Care (TOC) - Progression Note  ? ? ?Patient Details  ?Name: Marcia Woods ?MRN: [id number] ?Date of Birth: 11-11-1925 ? ?Transition of Care (TOC) CM/SW Contact  ?Salome Arnt, LCSW ?Phone Number: ?10/12/2021, 11:27 AM ? ?Clinical Narrative:  Per MD, anticipate possible d/c tomorrow. TOC updated [employer] Authorization [id number]) valid through 4/28. TOC will continue to follow.   ? ? ? ?Expected Discharge Plan: [place] ?Barriers to Discharge: Continued Medical Work up ? ?Expected Discharge Plan and Services ?Expected Discharge Plan: [place] ?  ?  ?Post Acute Care Choice: [place] ?Living arrangements for the past 2 months: [place] ?                ?  ?  ?  ?  ?  ?  ?  ?  ?  ?  ? ? ?Social Determinants of Health (SDOH) Interventions ?  ? ?Readmission Risk Interventions ?   ? View : No data to display.  ?  ?  ?  ? ? ?

## 2021-10-12 NOTE — Progress Notes (Signed)
OT Cancellation Note ? ?Patient Details ?Name: Marcia Woods ?MRN: [id number] ?DOB: 1925-12-26 ? ? ?Cancelled Treatment:    Reason Eval/Treat Not Completed: Medical issues which prohibited therapy ?Medical issues which prohibited therapy.  Patient transferred to a higher level of care and will need new OT consult to resume therapy when patient is medically stable.  Thank you. ? ?Ailene Ravel, OTR/L,CBIS  ?[phone] ? ?10/12/2021, 8:20 AM ?

## 2021-10-12 NOTE — Progress Notes (Signed)
PT Cancellation Note ? ?Patient Details ?Name: Marcia Woods ?MRN: [id number] ?DOB: 1925/12/04 ? ? ?Cancelled Treatment:    Reason Eval/Treat Not Completed: Medical issues which prohibited therapy.  Patient transferred to a higher level of care and will need new PT consult to resume therapy when patient is medically stable.  Thank you. ? ? ?7:47 AM, 10/12/21 ?Lonell Grandchild, MPT ?Physical Therapist with [place] ?[facility] ?[phone] office ?[phone] mobile phone ?' ?

## 2021-10-13 DIAGNOSIS — R5381 Other malaise: Secondary | ICD-10-CM | POA: Diagnosis not present

## 2021-10-13 DIAGNOSIS — Z9181 History of falling: Secondary | ICD-10-CM | POA: Diagnosis not present

## 2021-10-13 DIAGNOSIS — S32591A Other specified fracture of right pubis, initial encounter for closed fracture: Secondary | ICD-10-CM | POA: Diagnosis not present

## 2021-10-13 DIAGNOSIS — M6281 Muscle weakness (generalized): Secondary | ICD-10-CM | POA: Diagnosis not present

## 2021-10-13 DIAGNOSIS — I2510 Atherosclerotic heart disease of native coronary artery without angina pectoris: Secondary | ICD-10-CM | POA: Diagnosis not present

## 2021-10-13 DIAGNOSIS — F0390 Unspecified dementia without behavioral disturbance: Secondary | ICD-10-CM

## 2021-10-13 DIAGNOSIS — F419 Anxiety disorder, unspecified: Secondary | ICD-10-CM | POA: Diagnosis not present

## 2021-10-13 DIAGNOSIS — S32501D Unspecified fracture of right pubis, subsequent encounter for fracture with routine healing: Secondary | ICD-10-CM | POA: Diagnosis not present

## 2021-10-13 DIAGNOSIS — H3530 Unspecified macular degeneration: Secondary | ICD-10-CM | POA: Diagnosis not present

## 2021-10-13 DIAGNOSIS — S329XXA Fracture of unspecified parts of lumbosacral spine and pelvis, initial encounter for closed fracture: Secondary | ICD-10-CM | POA: Diagnosis not present

## 2021-10-13 DIAGNOSIS — E785 Hyperlipidemia, unspecified: Secondary | ICD-10-CM | POA: Diagnosis not present

## 2021-10-13 DIAGNOSIS — R911 Solitary pulmonary nodule: Secondary | ICD-10-CM | POA: Diagnosis not present

## 2021-10-13 DIAGNOSIS — W19XXXA Unspecified fall, initial encounter: Secondary | ICD-10-CM | POA: Diagnosis not present

## 2021-10-13 DIAGNOSIS — M158 Other polyosteoarthritis: Secondary | ICD-10-CM | POA: Diagnosis not present

## 2021-10-13 DIAGNOSIS — S32401D Unspecified fracture of right acetabulum, subsequent encounter for fracture with routine healing: Secondary | ICD-10-CM | POA: Diagnosis not present

## 2021-10-13 DIAGNOSIS — R2689 Other abnormalities of gait and mobility: Secondary | ICD-10-CM | POA: Diagnosis not present

## 2021-10-13 DIAGNOSIS — S32591D Other specified fracture of right pubis, subsequent encounter for fracture with routine healing: Secondary | ICD-10-CM | POA: Diagnosis not present

## 2021-10-13 DIAGNOSIS — K219 Gastro-esophageal reflux disease without esophagitis: Secondary | ICD-10-CM | POA: Diagnosis not present

## 2021-10-13 DIAGNOSIS — S32512A Fracture of superior rim of left pubis, initial encounter for closed fracture: Secondary | ICD-10-CM | POA: Diagnosis not present

## 2021-10-13 DIAGNOSIS — Z7901 Long term (current) use of anticoagulants: Secondary | ICD-10-CM | POA: Diagnosis not present

## 2021-10-13 DIAGNOSIS — I4891 Unspecified atrial fibrillation: Secondary | ICD-10-CM | POA: Diagnosis not present

## 2021-10-13 DIAGNOSIS — I4821 Permanent atrial fibrillation: Secondary | ICD-10-CM | POA: Diagnosis not present

## 2021-10-13 DIAGNOSIS — R269 Unspecified abnormalities of gait and mobility: Secondary | ICD-10-CM | POA: Diagnosis not present

## 2021-10-13 LAB — BASIC METABOLIC PANEL
Anion gap: 5 (ref 5–15)
BUN: 20 mg/dL (ref 8–23)
CO2: 26 mmol/L (ref 22–32)
Calcium: 8.8 mg/dL — ABNORMAL LOW (ref 8.9–10.3)
Chloride: 107 mmol/L (ref 98–111)
Creatinine, Ser: 0.82 mg/dL (ref 0.44–1.00)
GFR, Estimated: >60 mL/min (ref >60)
Glucose, Bld: 95 mg/dL (ref 70–99)
Potassium: 4.3 mmol/L (ref 3.5–5.1)
Sodium: 138 mmol/L (ref 135–145)

## 2021-10-13 LAB — MAGNESIUM: Magnesium: 2.1 mg/dL (ref 1.7–2.4)

## 2021-10-13 MED ORDER — METOPROLOL TARTRATE 25 MG PO TABS: 25.0000 mg | ORAL_TABLET | Freq: Two times a day (BID) | ORAL | Status: AC

## 2021-10-13 MED ORDER — DILTIAZEM HCL ER COATED BEADS 240 MG PO CP24
240.0000 mg | ORAL_CAPSULE | Freq: Every day | ORAL | Status: DC
  Administered 2021-10-13: 240 mg via ORAL
  Filled 2021-10-13: qty 1

## 2021-10-13 NOTE — Care Management Important Message (Signed)
Important Message ? ?Patient Details  ?Name: Marcia Woods ?MRN: [id number] ?Date of Birth: [birth year] ? ? ?Medicare Important Message Given:  Yes ? ? ? ? ?[name] ?10/13/2021, 12:39 PM ?

## 2021-10-13 NOTE — TOC Transition Note (Signed)
Transition of Care (TOC) - CM/SW Discharge Note ? ? ?Patient Details  ?Name: Marcia Woods ?MRN: [id number] ?Date of Birth: 1926-05-29 ? ?Transition of Care (TOC) CM/SW Contact:  ?Boneta Lucks, RN ?Phone Number: ?10/13/2021, 11:25 AM ? ? ?Clinical Narrative:   Patient discharging to [facility] today. RN to call report. Medical necessity completed. TOC will schedule EMS when [name] provides a room number.  [name], daughter in law updated with discharge plan.  ? ? ? ?Final next level of care: [place] ?Barriers to Discharge: Barriers Resolved ? ? ?Patient Goals and CMS Choice ?Patient states their goals for this hospitalization and ongoing recovery are:: go to SNF ?CMS Medicare.gov Compare Post Acute Care list provided to:: Patient ?Choice offered to / list presented to : Patient ? ?Discharge Placement ?  ?           ?  ?Patient to be transferred to facility by: EMS ?Name of family member notified: [name] ?Patient and family notified of of transfer: 10/13/21 ? ?Discharge Plan and Services ?  ?  ?Post Acute Care Choice: [place]          ?   ?  ? Readmission Risk Interventions ? ?  10/13/2021  ? 11:23 AM  ?Readmission Risk Prevention Plan  ?Post Dischage Appt Complete  ?Medication Screening Complete  ?Transportation Screening Complete  ? ? ? ? ? ?

## 2021-10-13 NOTE — Progress Notes (Signed)
[place] and gave report to RN there. I did not get a name. She asked that DNR and prescriptions be sent. The only printed prescription is for pain medication.  ?

## 2021-10-13 NOTE — Progress Notes (Signed)
Nsg Discharge Note ? ?Admit Date:  10/07/2021 ?Discharge date: 10/13/2021 ?  ?Meryl Dare to be D/C'd Skilled nursing facility per MD order.  AVS completed.  Copy for chart, and copy for patient signed, and dated. ?Patient/caregiver able to verbalize understanding. ? ?Discharge Medication: ?Allergies as of 10/13/2021   ? ?   Reactions  ? Codeine Nausea And Vomiting  ? ?  ? ?  ?Medication List  ?  ? ?STOP taking these medications   ? ?cephALEXin 250 MG capsule ?Commonly known as: KEFLEX ?  ? ?  ? ?TAKE these medications   ? ?apixaban 5 MG Tabs tablet ?Commonly known as: Eliquis ?Take 1 tablet (5 mg total) by mouth 2 (two) times daily. ?  ?diltiazem 240 MG 24 hr capsule ?Commonly known as: CARDIZEM CD ?TAKE (1) CAPSULE BY MOUTH ONCE DAILY. ?  ?furosemide 20 MG tablet ?Commonly known as: LASIX ?TAKE (1) TABLET BY MOUTH ONCE DAILY. ?What changed: See the new instructions. ?  ?HYDROcodone-acetaminophen 5-325 MG tablet ?Commonly known as: NORCO/VICODIN ?Take 1 tablet by mouth every 6 (six) hours as needed for moderate pain or severe pain. ?  ?metoprolol tartrate 25 MG tablet ?Commonly known as: LOPRESSOR ?Take 1 tablet (25 mg total) by mouth 2 (two) times daily. ?What changed:  ?medication strength ?See the new instructions. ?  ? ?  ? ? ?Discharge Assessment: ?Vitals:  ? 10/13/21 1400 10/13/21 1500  ?BP: 139/60 (!) 133/92  ?Pulse: 70 88  ?Resp: 14 19  ?Temp:    ?SpO2: 93% 94%  ? Skin clean, dry and intact without evidence of skin break down, no evidence of skin tears noted. ?IV catheter discontinued intact. Site without signs and symptoms of complications - no redness or edema noted at insertion site, patient denies c/o pain - only slight tenderness at site.  Dressing with slight pressure applied. ? ?D/c Instructions-Education: ?Discharge instructions given to patient/family with verbalized understanding. ?D/c education completed with patient/family including follow up instructions, medication list, d/c activities  limitations if indicated, with other d/c instructions as indicated by MD - patient able to verbalize understanding, all questions fully answered. ?Patient instructed to return to ED, call 911, or call MD for any changes in condition.  ?Patient escorted via EMS, and D/C TO SNF. ?Carney Corners, RN ?10/13/2021 4:08 PM  ?

## 2021-10-15 DIAGNOSIS — I4821 Permanent atrial fibrillation: Secondary | ICD-10-CM | POA: Diagnosis not present

## 2021-10-15 DIAGNOSIS — S32501D Unspecified fracture of right pubis, subsequent encounter for fracture with routine healing: Secondary | ICD-10-CM | POA: Diagnosis not present

## 2021-10-15 DIAGNOSIS — R5381 Other malaise: Secondary | ICD-10-CM | POA: Diagnosis not present

## 2021-10-15 DIAGNOSIS — W19XXXA Unspecified fall, initial encounter: Secondary | ICD-10-CM | POA: Diagnosis not present

## 2021-10-16 DIAGNOSIS — S329XXA Fracture of unspecified parts of lumbosacral spine and pelvis, initial encounter for closed fracture: Secondary | ICD-10-CM | POA: Diagnosis not present

## 2021-10-16 DIAGNOSIS — I4821 Permanent atrial fibrillation: Secondary | ICD-10-CM | POA: Diagnosis not present

## 2021-10-16 DIAGNOSIS — R5381 Other malaise: Secondary | ICD-10-CM | POA: Diagnosis not present

## 2021-10-16 DIAGNOSIS — W19XXXA Unspecified fall, initial encounter: Secondary | ICD-10-CM | POA: Diagnosis not present

## 2021-10-27 ENCOUNTER — Ambulatory Visit: Payer: PPO | Admitting: Orthopedic Surgery

## 2021-11-04 ENCOUNTER — Encounter: Payer: Self-pay | Admitting: Orthopedic Surgery

## 2021-11-04 ENCOUNTER — Ambulatory Visit (INDEPENDENT_AMBULATORY_CARE_PROVIDER_SITE_OTHER): Payer: PPO

## 2021-11-04 ENCOUNTER — Ambulatory Visit (INDEPENDENT_AMBULATORY_CARE_PROVIDER_SITE_OTHER): Payer: PPO | Admitting: Orthopedic Surgery

## 2021-11-04 VITALS — Ht 61.0 in | Wt 139.0 lb

## 2021-11-04 DIAGNOSIS — S32512A Fracture of superior rim of left pubis, initial encounter for closed fracture: Secondary | ICD-10-CM | POA: Diagnosis not present

## 2021-11-04 DIAGNOSIS — S32401D Unspecified fracture of right acetabulum, subsequent encounter for fracture with routine healing: Secondary | ICD-10-CM

## 2021-11-04 DIAGNOSIS — S32591A Other specified fracture of right pubis, initial encounter for closed fracture: Secondary | ICD-10-CM

## 2021-11-04 NOTE — Patient Instructions (Signed)
Medications as needed, Tylenol is sufficient ?Weightbearing as tolerated, using a walker. ?No follow-up is needed, but if there are any issues please contact the clinic. ?

## 2021-11-05 ENCOUNTER — Encounter: Payer: Self-pay | Admitting: Orthopedic Surgery

## 2021-11-05 NOTE — Progress Notes (Signed)
New Patient Visit  Assessment: Marcia Woods is a 88 y.o. female with the following: 1. Closed fracture of superior ramus of left pubis, initial encounter ([place]) 2. Closed fracture of right inferior pubic ramus, initial encounter ([place])   Plan: Marcia Woods sustained right sided pelvis fractures after a fall.  Fractures in part of the weightbearing surface.  Continue with medications as needed.  Weightbearing as tolerated using walker.  No follow-up is needed but if there are issues, they should contact the clinic.  Follow-up: Return if symptoms worsen or fail to improve.  Subjective:  Chief Complaint  Patient presents with   Fracture    Rt hip DOI 10/07/21     History of Present Illness: Marcia Woods is a 88 y.o. female who presents for evaluation of right hip pain.  She sustained a fall, approximate month ago.  She was seen in the emergency department, noted to have superior and inferior pubic rami fractures.  Since then, she has slowly getting better.  She is working with therapy in her facility.  No numbness or tingling   Review of Systems: No fevers or chills No numbness or tingling No chest pain No shortness of breath No bowel or bladder dysfunction No GI distress No headaches   Medical History:  Past Medical History:  Diagnosis Date   Ankle fracture 05/22/2013   Anxiety    Arthritis    Atrial fibrillation ([place])    Constipation    Dermatitis of lower extremity 11/18/2015   GERD (gastroesophageal reflux disease)    Hyperlipemia    Macular degeneration    Nodule of left lung    PSVT (paroxysmal supraventricular tachycardia) ([place]) 03/05/2013   Vaginal atrophy     Past Surgical History:  Procedure Laterality Date   ABDOMINAL HYSTERECTOMY     BRONCHIAL BIOPSY N/A 06/11/2013   Procedure: TRANSBRONCHIAL BIOPSIES;  Surgeon: Grace Isaac, MD;  Location: [facility];  Service: Thoracic;  Laterality: N/A;   Cataract surgery     bilaterally   CERVICAL DISC  SURGERY     CHOLECYSTECTOMY  01/26/2012   Procedure: LAPAROSCOPIC CHOLECYSTECTOMY;  Surgeon: Jamesetta So, MD;  Location: [facility];  Service: General;  Laterality: N/A;   COLONOSCOPY  04/30/2011   Procedure: COLONOSCOPY;  Surgeon: Rogene Houston, MD;  [facility];  Service: Endoscopy;  Laterality: N/A;  8:30 am   ENDOBRONCHIAL ULTRASOUND N/A 06/11/2013   Procedure: ENDOBRONCHIAL ULTRASOUND;  Surgeon: Grace Isaac, MD;  Location: [facility];  Service: Thoracic;  Laterality: N/A;   ESOPHAGOGASTRODUODENOSCOPY  05/27/2011   Procedure: ESOPHAGOGASTRODUODENOSCOPY (EGD);  Surgeon: Rogene Houston, MD;  [facility];  Service: Endoscopy;  Laterality: N/A;  300   HEMORRHOID SURGERY     KNEE ARTHROSCOPY  2010   left-[facility]   ORIF WRIST FRACTURE Left 12/04/2014   Procedure: OPEN REDUCTION INTERNAL FIXATION LEFT WRIST FRACTURE;  Surgeon: Carole Civil, MD;  Location: [facility];  Service: Orthopedics;  Laterality: Left;   TRIGGER FINGER RELEASE     right   VIDEO BRONCHOSCOPY WITH ENDOBRONCHIAL NAVIGATION N/A 06/11/2013   Procedure: VIDEO BRONCHOSCOPY WITH ENDOBRONCHIAL NAVIGATION;  Surgeon: Grace Isaac, MD;  Location: [facility];  Service: Thoracic;  Laterality: N/A;    Family History  Problem Relation Age of Onset   Heart disease Mother    Heart disease Father    Cancer Son        prostate   Cancer Son  throat   Colon cancer Neg Hx    Social History   Tobacco Use   Smoking status: Never   Smokeless tobacco: Never  Vaping Use   Vaping Use: Never used  Substance Use Topics   Alcohol use: No    Alcohol/week: 0.0 standard drinks   Drug use: No    Allergies  Allergen Reactions   Codeine Nausea And Vomiting    No outpatient medications have been marked as taking for the 11/04/21 encounter (Office Visit) with Mordecai Rasmussen, MD.    Objective: Ht '5\' 1"'$  (1.549 m)   Wt 139 lb (63 kg)   BMI 26.26 kg/m   Physical Exam:  General: Elderly female. and  Seated in a wheelchair. Gait: Unable to ambulate.  Bilateral lower extremities without deformity.  She tolerates axial loading.  She can maintain a straight leg raise.  Toes are warm and well-perfused.  IMAGING: I personally ordered and reviewed the following images  AP pelvis and right hip x-rays were obtained in clinic today.  She has a high superior pubic ramus fracture, with maintenance of alignment.  There is some callus formation.  There is a minimally displaced inferior pubic rami fracture, with evidence of healing.  No acute injuries are noted.  Impression: Healing right superior and inferior pubic ramus fractures.   New Medications:  No orders of the defined types were placed in this encounter.     Mordecai Rasmussen, MD  11/05/2021 11:34 PM

## 2021-11-13 DIAGNOSIS — I4821 Permanent atrial fibrillation: Secondary | ICD-10-CM | POA: Diagnosis not present

## 2021-11-13 DIAGNOSIS — I2510 Atherosclerotic heart disease of native coronary artery without angina pectoris: Secondary | ICD-10-CM | POA: Diagnosis not present

## 2021-11-13 DIAGNOSIS — S32501D Unspecified fracture of right pubis, subsequent encounter for fracture with routine healing: Secondary | ICD-10-CM | POA: Diagnosis not present

## 2021-12-07 DIAGNOSIS — F0390 Unspecified dementia without behavioral disturbance: Secondary | ICD-10-CM | POA: Diagnosis not present

## 2021-12-07 DIAGNOSIS — I4891 Unspecified atrial fibrillation: Secondary | ICD-10-CM | POA: Diagnosis not present

## 2021-12-08 DIAGNOSIS — R2689 Other abnormalities of gait and mobility: Secondary | ICD-10-CM | POA: Diagnosis not present

## 2021-12-08 DIAGNOSIS — S32591D Other specified fracture of right pubis, subsequent encounter for fracture with routine healing: Secondary | ICD-10-CM | POA: Diagnosis not present

## 2021-12-08 DIAGNOSIS — M6281 Muscle weakness (generalized): Secondary | ICD-10-CM | POA: Diagnosis not present

## 2021-12-21 DIAGNOSIS — M6281 Muscle weakness (generalized): Secondary | ICD-10-CM | POA: Diagnosis not present

## 2021-12-21 DIAGNOSIS — S32591D Other specified fracture of right pubis, subsequent encounter for fracture with routine healing: Secondary | ICD-10-CM | POA: Diagnosis not present

## 2021-12-21 DIAGNOSIS — R2689 Other abnormalities of gait and mobility: Secondary | ICD-10-CM | POA: Diagnosis not present

## 2022-01-13 DIAGNOSIS — E782 Mixed hyperlipidemia: Secondary | ICD-10-CM | POA: Diagnosis not present

## 2022-01-13 DIAGNOSIS — K219 Gastro-esophageal reflux disease without esophagitis: Secondary | ICD-10-CM | POA: Diagnosis not present

## 2022-01-13 DIAGNOSIS — H3530 Unspecified macular degeneration: Secondary | ICD-10-CM | POA: Diagnosis not present

## 2022-01-20 DIAGNOSIS — M6281 Muscle weakness (generalized): Secondary | ICD-10-CM | POA: Diagnosis not present

## 2022-01-20 DIAGNOSIS — R2689 Other abnormalities of gait and mobility: Secondary | ICD-10-CM | POA: Diagnosis not present

## 2022-01-20 DIAGNOSIS — S32591D Other specified fracture of right pubis, subsequent encounter for fracture with routine healing: Secondary | ICD-10-CM | POA: Diagnosis not present

## 2022-01-22 DIAGNOSIS — S32501D Unspecified fracture of right pubis, subsequent encounter for fracture with routine healing: Secondary | ICD-10-CM | POA: Diagnosis not present

## 2022-02-19 DIAGNOSIS — Z7901 Long term (current) use of anticoagulants: Secondary | ICD-10-CM | POA: Diagnosis not present

## 2022-02-19 DIAGNOSIS — N950 Postmenopausal bleeding: Secondary | ICD-10-CM | POA: Diagnosis not present

## 2022-02-22 DIAGNOSIS — N950 Postmenopausal bleeding: Secondary | ICD-10-CM | POA: Diagnosis not present

## 2022-02-23 DIAGNOSIS — N939 Abnormal uterine and vaginal bleeding, unspecified: Secondary | ICD-10-CM | POA: Diagnosis not present

## 2022-03-01 DIAGNOSIS — R296 Repeated falls: Secondary | ICD-10-CM | POA: Diagnosis not present

## 2022-03-10 DIAGNOSIS — B351 Tinea unguium: Secondary | ICD-10-CM | POA: Diagnosis not present

## 2022-03-10 DIAGNOSIS — M79674 Pain in right toe(s): Secondary | ICD-10-CM | POA: Diagnosis not present

## 2022-03-10 DIAGNOSIS — M79675 Pain in left toe(s): Secondary | ICD-10-CM | POA: Diagnosis not present

## 2022-03-16 DIAGNOSIS — I10 Essential (primary) hypertension: Secondary | ICD-10-CM | POA: Diagnosis not present

## 2022-03-16 DIAGNOSIS — I4891 Unspecified atrial fibrillation: Secondary | ICD-10-CM | POA: Diagnosis not present

## 2022-05-26 DIAGNOSIS — M79674 Pain in right toe(s): Secondary | ICD-10-CM | POA: Diagnosis not present

## 2022-05-26 DIAGNOSIS — M79675 Pain in left toe(s): Secondary | ICD-10-CM | POA: Diagnosis not present

## 2022-05-26 DIAGNOSIS — B351 Tinea unguium: Secondary | ICD-10-CM | POA: Diagnosis not present

## 2022-05-31 DIAGNOSIS — F411 Generalized anxiety disorder: Secondary | ICD-10-CM | POA: Diagnosis not present

## 2022-06-03 DIAGNOSIS — F411 Generalized anxiety disorder: Secondary | ICD-10-CM | POA: Diagnosis not present

## 2022-06-08 DIAGNOSIS — R41841 Cognitive communication deficit: Secondary | ICD-10-CM | POA: Diagnosis not present

## 2022-06-08 DIAGNOSIS — R293 Abnormal posture: Secondary | ICD-10-CM | POA: Diagnosis not present

## 2022-06-08 DIAGNOSIS — R269 Unspecified abnormalities of gait and mobility: Secondary | ICD-10-CM | POA: Diagnosis not present

## 2022-06-08 DIAGNOSIS — M6281 Muscle weakness (generalized): Secondary | ICD-10-CM | POA: Diagnosis not present

## 2022-06-08 DIAGNOSIS — M158 Other polyosteoarthritis: Secondary | ICD-10-CM | POA: Diagnosis not present

## 2022-06-17 DIAGNOSIS — F411 Generalized anxiety disorder: Secondary | ICD-10-CM | POA: Diagnosis not present

## 2022-06-22 DIAGNOSIS — R269 Unspecified abnormalities of gait and mobility: Secondary | ICD-10-CM | POA: Diagnosis not present

## 2022-06-22 DIAGNOSIS — M6281 Muscle weakness (generalized): Secondary | ICD-10-CM | POA: Diagnosis not present

## 2022-06-22 DIAGNOSIS — R293 Abnormal posture: Secondary | ICD-10-CM | POA: Diagnosis not present

## 2022-06-22 DIAGNOSIS — M158 Other polyosteoarthritis: Secondary | ICD-10-CM | POA: Diagnosis not present

## 2022-06-25 DIAGNOSIS — F411 Generalized anxiety disorder: Secondary | ICD-10-CM | POA: Diagnosis not present

## 2022-07-10 DIAGNOSIS — E782 Mixed hyperlipidemia: Secondary | ICD-10-CM | POA: Diagnosis not present

## 2022-07-10 DIAGNOSIS — H3530 Unspecified macular degeneration: Secondary | ICD-10-CM | POA: Diagnosis not present

## 2022-07-10 DIAGNOSIS — K219 Gastro-esophageal reflux disease without esophagitis: Secondary | ICD-10-CM | POA: Diagnosis not present

## 2022-07-20 DIAGNOSIS — F411 Generalized anxiety disorder: Secondary | ICD-10-CM | POA: Diagnosis not present

## 2022-07-22 DIAGNOSIS — M158 Other polyosteoarthritis: Secondary | ICD-10-CM | POA: Diagnosis not present

## 2022-07-22 DIAGNOSIS — I4891 Unspecified atrial fibrillation: Secondary | ICD-10-CM | POA: Diagnosis not present

## 2022-07-22 DIAGNOSIS — I10 Essential (primary) hypertension: Secondary | ICD-10-CM | POA: Diagnosis not present

## 2022-07-22 DIAGNOSIS — R293 Abnormal posture: Secondary | ICD-10-CM | POA: Diagnosis not present

## 2022-07-22 DIAGNOSIS — M6281 Muscle weakness (generalized): Secondary | ICD-10-CM | POA: Diagnosis not present

## 2022-07-22 DIAGNOSIS — R269 Unspecified abnormalities of gait and mobility: Secondary | ICD-10-CM | POA: Diagnosis not present

## 2022-08-20 DIAGNOSIS — M158 Other polyosteoarthritis: Secondary | ICD-10-CM | POA: Diagnosis not present

## 2022-08-20 DIAGNOSIS — R293 Abnormal posture: Secondary | ICD-10-CM | POA: Diagnosis not present

## 2022-08-20 DIAGNOSIS — R269 Unspecified abnormalities of gait and mobility: Secondary | ICD-10-CM | POA: Diagnosis not present

## 2022-08-20 DIAGNOSIS — M6281 Muscle weakness (generalized): Secondary | ICD-10-CM | POA: Diagnosis not present

## 2022-09-06 DIAGNOSIS — H3530 Unspecified macular degeneration: Secondary | ICD-10-CM | POA: Diagnosis not present

## 2022-09-06 DIAGNOSIS — K219 Gastro-esophageal reflux disease without esophagitis: Secondary | ICD-10-CM | POA: Diagnosis not present

## 2022-09-06 DIAGNOSIS — E782 Mixed hyperlipidemia: Secondary | ICD-10-CM | POA: Diagnosis not present

## 2022-09-21 DIAGNOSIS — M6281 Muscle weakness (generalized): Secondary | ICD-10-CM | POA: Diagnosis not present

## 2022-10-07 DIAGNOSIS — F419 Anxiety disorder, unspecified: Secondary | ICD-10-CM | POA: Diagnosis not present

## 2022-10-18 DIAGNOSIS — I10 Essential (primary) hypertension: Secondary | ICD-10-CM | POA: Diagnosis not present

## 2022-10-18 DIAGNOSIS — E782 Mixed hyperlipidemia: Secondary | ICD-10-CM | POA: Diagnosis not present

## 2022-10-18 DIAGNOSIS — I4891 Unspecified atrial fibrillation: Secondary | ICD-10-CM | POA: Diagnosis not present

## 2022-10-18 DIAGNOSIS — R600 Localized edema: Secondary | ICD-10-CM | POA: Diagnosis not present

## 2022-10-20 DIAGNOSIS — M6281 Muscle weakness (generalized): Secondary | ICD-10-CM | POA: Diagnosis not present

## 2022-10-27 DIAGNOSIS — M79675 Pain in left toe(s): Secondary | ICD-10-CM | POA: Diagnosis not present

## 2022-10-27 DIAGNOSIS — M79674 Pain in right toe(s): Secondary | ICD-10-CM | POA: Diagnosis not present

## 2022-10-27 DIAGNOSIS — B351 Tinea unguium: Secondary | ICD-10-CM | POA: Diagnosis not present

## 2022-11-03 DIAGNOSIS — K219 Gastro-esophageal reflux disease without esophagitis: Secondary | ICD-10-CM | POA: Diagnosis not present

## 2022-11-03 DIAGNOSIS — E782 Mixed hyperlipidemia: Secondary | ICD-10-CM | POA: Diagnosis not present

## 2022-11-03 DIAGNOSIS — H3530 Unspecified macular degeneration: Secondary | ICD-10-CM | POA: Diagnosis not present

## 2022-11-18 DIAGNOSIS — I10 Essential (primary) hypertension: Secondary | ICD-10-CM | POA: Diagnosis not present

## 2022-11-18 DIAGNOSIS — I4891 Unspecified atrial fibrillation: Secondary | ICD-10-CM | POA: Diagnosis not present

## 2022-11-18 DIAGNOSIS — F0390 Unspecified dementia without behavioral disturbance: Secondary | ICD-10-CM | POA: Diagnosis not present

## 2022-11-18 DIAGNOSIS — R600 Localized edema: Secondary | ICD-10-CM | POA: Diagnosis not present

## 2022-11-18 DIAGNOSIS — H3530 Unspecified macular degeneration: Secondary | ICD-10-CM | POA: Diagnosis not present

## 2022-11-23 DIAGNOSIS — F0390 Unspecified dementia without behavioral disturbance: Secondary | ICD-10-CM | POA: Diagnosis not present

## 2022-11-23 DIAGNOSIS — H579 Unspecified disorder of eye and adnexa: Secondary | ICD-10-CM | POA: Diagnosis not present

## 2022-11-23 DIAGNOSIS — J302 Other seasonal allergic rhinitis: Secondary | ICD-10-CM | POA: Diagnosis not present

## 2022-11-23 DIAGNOSIS — L299 Pruritus, unspecified: Secondary | ICD-10-CM | POA: Diagnosis not present

## 2022-11-23 DIAGNOSIS — I10 Essential (primary) hypertension: Secondary | ICD-10-CM | POA: Diagnosis not present

## 2022-12-01 DIAGNOSIS — J069 Acute upper respiratory infection, unspecified: Secondary | ICD-10-CM | POA: Diagnosis not present

## 2022-12-01 DIAGNOSIS — R058 Other specified cough: Secondary | ICD-10-CM | POA: Diagnosis not present

## 2022-12-01 DIAGNOSIS — R062 Wheezing: Secondary | ICD-10-CM | POA: Diagnosis not present

## 2022-12-03 DIAGNOSIS — R059 Cough, unspecified: Secondary | ICD-10-CM | POA: Diagnosis not present

## 2022-12-06 ENCOUNTER — Emergency Department (HOSPITAL_COMMUNITY)
Admission: EM | Admit: 2022-12-06 | Discharge: 2022-12-07 | Disposition: A | Discharge status: Skilled Nursing Facility | Payer: PPO | Attending: Emergency Medicine | Admitting: Emergency Medicine

## 2022-12-06 ENCOUNTER — Encounter (HOSPITAL_COMMUNITY): Payer: Self-pay

## 2022-12-06 ENCOUNTER — Other Ambulatory Visit: Payer: Self-pay

## 2022-12-06 ENCOUNTER — Emergency Department (HOSPITAL_COMMUNITY): Payer: PPO

## 2022-12-06 DIAGNOSIS — N898 Other specified noninflammatory disorders of vagina: Secondary | ICD-10-CM | POA: Insufficient documentation

## 2022-12-06 DIAGNOSIS — J40 Bronchitis, not specified as acute or chronic: Secondary | ICD-10-CM | POA: Diagnosis not present

## 2022-12-06 DIAGNOSIS — R9431 Abnormal electrocardiogram [ECG] [EKG]: Secondary | ICD-10-CM | POA: Diagnosis not present

## 2022-12-06 DIAGNOSIS — R059 Cough, unspecified: Secondary | ICD-10-CM | POA: Diagnosis present

## 2022-12-06 DIAGNOSIS — Z79899 Other long term (current) drug therapy: Secondary | ICD-10-CM | POA: Diagnosis not present

## 2022-12-06 DIAGNOSIS — R4182 Altered mental status, unspecified: Secondary | ICD-10-CM | POA: Diagnosis not present

## 2022-12-06 DIAGNOSIS — E871 Hypo-osmolality and hyponatremia: Secondary | ICD-10-CM | POA: Diagnosis not present

## 2022-12-06 DIAGNOSIS — N3001 Acute cystitis with hematuria: Secondary | ICD-10-CM | POA: Diagnosis not present

## 2022-12-06 DIAGNOSIS — R7309 Other abnormal glucose: Secondary | ICD-10-CM | POA: Insufficient documentation

## 2022-12-06 DIAGNOSIS — R0602 Shortness of breath: Secondary | ICD-10-CM | POA: Diagnosis not present

## 2022-12-06 DIAGNOSIS — Z7901 Long term (current) use of anticoagulants: Secondary | ICD-10-CM | POA: Diagnosis not present

## 2022-12-06 DIAGNOSIS — R0902 Hypoxemia: Secondary | ICD-10-CM | POA: Diagnosis not present

## 2022-12-06 DIAGNOSIS — J189 Pneumonia, unspecified organism: Secondary | ICD-10-CM | POA: Diagnosis not present

## 2022-12-06 LAB — COMPREHENSIVE METABOLIC PANEL
ALT: 15 U/L (ref 0–44)
AST: 28 U/L (ref 15–41)
Albumin: 3.3 g/dL — ABNORMAL LOW (ref 3.5–5.0)
Alkaline Phosphatase: 70 U/L (ref 38–126)
Anion gap: 9 (ref 5–15)
BUN: 18 mg/dL (ref 8–23)
CO2: 24 mmol/L (ref 22–32)
Calcium: 9.2 mg/dL (ref 8.9–10.3)
Chloride: 98 mmol/L (ref 98–111)
Creatinine, Ser: 0.98 mg/dL (ref 0.44–1.00)
GFR, Estimated: 53 mL/min — ABNORMAL LOW (ref >60)
Glucose, Bld: 108 mg/dL — ABNORMAL HIGH (ref 70–99)
Potassium: 3.5 mmol/L (ref 3.5–5.1)
Sodium: 131 mmol/L — ABNORMAL LOW (ref 135–145)
Total Bilirubin: 0.6 mg/dL (ref 0.3–1.2)
Total Protein: 7.6 g/dL (ref 6.5–8.1)

## 2022-12-06 LAB — URINALYSIS, W/ REFLEX TO CULTURE (INFECTION SUSPECTED)
Bilirubin Urine: NEGATIVE
Glucose, UA: NEGATIVE mg/dL
Ketones, ur: NEGATIVE mg/dL
Nitrite: NEGATIVE
Protein, ur: 30 mg/dL — AB
Specific Gravity, Urine: 1.013 (ref 1.005–1.030)
WBC, UA: >50 WBC/hpf (ref 0–5)
pH: 5 (ref 5.0–8.0)

## 2022-12-06 LAB — CBC
HCT: 41 % (ref 36.0–46.0)
Hemoglobin: 13.8 g/dL (ref 12.0–15.0)
MCH: 30.8 pg (ref 26.0–34.0)
MCHC: 33.7 g/dL (ref 30.0–36.0)
MCV: 91.5 fL (ref 80.0–100.0)
Platelets: 282 10*3/uL (ref 150–400)
RBC: 4.48 MIL/uL (ref 3.87–5.11)
RDW: 12.5 % (ref 11.5–15.5)
WBC: 6.5 10*3/uL (ref 4.0–10.5)
nRBC: 0 % (ref 0.0–0.2)

## 2022-12-06 LAB — TROPONIN I (HIGH SENSITIVITY)
Troponin I (High Sensitivity): 15 ng/L (ref <18)
Troponin I (High Sensitivity): 16 ng/L (ref <18)

## 2022-12-06 LAB — CBG MONITORING, ED: Glucose-Capillary: 107 mg/dL — ABNORMAL HIGH (ref 70–99)

## 2022-12-06 LAB — AMMONIA: Ammonia: 18 umol/L (ref 9–35)

## 2022-12-06 NOTE — ED Triage Notes (Addendum)
Pt from [facility], sent via EMS for vaginal discharge, hypoxia, and AMS. Pt O2 94%, pt on 2L upon arrival of EMS. Per EMS, pt on abx for pneumonia. Pt denies pain, states she has had productive cough x 1 week. Denies SOB. Pt A&O x 3

## 2022-12-07 DIAGNOSIS — R41841 Cognitive communication deficit: Secondary | ICD-10-CM | POA: Diagnosis not present

## 2022-12-07 DIAGNOSIS — M6281 Muscle weakness (generalized): Secondary | ICD-10-CM | POA: Diagnosis not present

## 2022-12-07 DIAGNOSIS — R279 Unspecified lack of coordination: Secondary | ICD-10-CM | POA: Diagnosis not present

## 2022-12-07 DIAGNOSIS — R911 Solitary pulmonary nodule: Secondary | ICD-10-CM | POA: Diagnosis not present

## 2022-12-07 DIAGNOSIS — G894 Chronic pain syndrome: Secondary | ICD-10-CM | POA: Diagnosis not present

## 2022-12-07 DIAGNOSIS — J40 Bronchitis, not specified as acute or chronic: Secondary | ICD-10-CM | POA: Diagnosis not present

## 2022-12-07 DIAGNOSIS — K219 Gastro-esophageal reflux disease without esophagitis: Secondary | ICD-10-CM | POA: Diagnosis not present

## 2022-12-07 DIAGNOSIS — Z9181 History of falling: Secondary | ICD-10-CM | POA: Diagnosis not present

## 2022-12-07 DIAGNOSIS — I10 Essential (primary) hypertension: Secondary | ICD-10-CM | POA: Diagnosis not present

## 2022-12-07 DIAGNOSIS — S32591D Other specified fracture of right pubis, subsequent encounter for fracture with routine healing: Secondary | ICD-10-CM | POA: Diagnosis not present

## 2022-12-07 DIAGNOSIS — F0390 Unspecified dementia without behavioral disturbance: Secondary | ICD-10-CM | POA: Diagnosis not present

## 2022-12-07 LAB — WET PREP, GENITAL
Clue Cells Wet Prep HPF POC: NONE SEEN
Sperm: NONE SEEN
Trich, Wet Prep: NONE SEEN
WBC, Wet Prep HPF POC: >=10 — AB (ref <10)
Yeast Wet Prep HPF POC: NONE SEEN

## 2022-12-07 MED ORDER — IPRATROPIUM-ALBUTEROL 0.5-2.5 (3) MG/3ML IN SOLN
3.0000 mL | Freq: Once | RESPIRATORY_TRACT | Status: AC
  Administered 2022-12-07: 3 mL via RESPIRATORY_TRACT
  Filled 2022-12-07: qty 3

## 2022-12-07 MED ORDER — CEFDINIR 300 MG PO CAPS: 300.0000 mg | ORAL_CAPSULE | Freq: Two times a day (BID) | ORAL | 0 refills | Status: DC

## 2022-12-07 MED ORDER — SODIUM CHLORIDE 0.9 % IV SOLN
1.0000 g | Freq: Once | INTRAVENOUS | Status: AC
  Administered 2022-12-07: 1 g via INTRAVENOUS
  Filled 2022-12-07: qty 10

## 2022-12-07 MED ORDER — METHYLPREDNISOLONE SODIUM SUCC 125 MG IJ SOLR
125.0000 mg | Freq: Once | INTRAMUSCULAR | Status: AC
  Administered 2022-12-07: 125 mg via INTRAVENOUS
  Filled 2022-12-07: qty 2

## 2022-12-07 MED ORDER — METHYLPREDNISOLONE 4 MG PO TBPK: ORAL_TABLET | Freq: Every day | ORAL | 0 refills | Status: AC

## 2022-12-07 MED ORDER — NYSTATIN 100000 UNIT/GM EX CREA: TOPICAL_CREAM | CUTANEOUS | 0 refills | Status: AC

## 2022-12-07 NOTE — Discharge Instructions (Addendum)
You were seen in the emergency room today for evaluation of your vaginal irritation.  I am concerned at the parents of the sores on your vagina.  I would like for you to follow-up with a gynecologist.  I have included information for Dr. Durenda Hurt into this discharge paperwork for you to call to schedule an appointment with.  I am prescribing you some nystatin cream to apply to the area.  Also recommend applying Desitin over the areas and making sure the area stays as dry as possible.  Additionally, you have a urinary tract infection, we are prescribing an antibiotic that you will take twice daily for the next 7 days to help with this.  For your cough I likely you have some bronchitis.  Your oxygenation has improved while here and you do not require any nasal cannula oxygen.  I am prescribing you a steroid for you to take as prescribed to help with the inflammation in your lungs.  If you have any concerns, new or worsening symptoms, please return to the nearest emergency department for evaluation.  Contact a doctor if: Your symptoms do not get better in 2 weeks. You have trouble coughing up the mucus. Your cough keeps you awake at night. You have a fever. Get help right away if: You cough up blood. You have chest pain. You have very bad shortness of breath. You faint or keep feeling like you are going to faint. You have a very bad headache. Your fever or chills get worse. These symptoms may be an emergency. Get help right away. Call your local emergency services (911 in the [place] Do not wait to see if the symptoms will go away. Do not drive yourself to the hospital.

## 2022-12-07 NOTE — ED Notes (Signed)
Attempted to call report

## 2022-12-07 NOTE — ED Notes (Signed)
EMS arrived to transport pt back to [facility]. Pt alert and oriented to baseline. Pt opted to wait for a bath "at home" later.

## 2022-12-07 NOTE — ED Provider Notes (Signed)
EMERGENCY DEPARTMENT AT [facility] Provider Note   CSN: [id number] Arrival date & time: 12/06/22  1607     History Chief Complaint  Patient presents with   Cough    Marcia Woods is a 89 y.o. female presents to the ER from [facility] with DIL for evaluation of cough and vaginal itching. Triage note mentions AMS, but DIL reports that she is at her baseline. DIL reports that she was having a low O2 reading a few days ago and was placed on oxygen. The patient was on an antibiotic, but unknown start date or name. I called [facility] and spoke with lead nurse, Morrie Sheldon, about why she was sent over and to clarify the AMS. Nurse reports that over the past few days she has had a couple of incidences with some transient confusion, but then improves. She reports that the patient is on her last day of azithromycin today and has been getting duonebs when requested. Additionally, she is on fluconazole from 6/15-6/21 for vaginal yeast. Morrie Sheldon reports that the family requested she be sent over for evaluation of her vaginal exam/itching and cough. The patient reports that her cough is mainly dry. She denies any SOB or any chest pain. She denies any rhinorrhea, nasal congestion, or fever. She reports some vaginal itching. Denies any dysuria or abdominal pain. She is currently on elliquis for afib. DIL reports that the facility manages her medications, so more than likely she has not missed any doses.    Cough Associated symptoms: no chest pain, no chills, no fever, no rhinorrhea and no shortness of breath        Home Medications Prior to Admission medications   Medication Sig Start Date End Date Taking? Authorizing Provider  apixaban (ELIQUIS) 5 MG TABS tablet Take 1 tablet (5 mg total) by mouth 2 (two) times daily. 04/29/20   Jonelle Sidle, MD  diltiazem (CARDIZEM CD) 240 MG 24 hr capsule TAKE (1) CAPSULE BY MOUTH ONCE DAILY. Patient not taking: Reported on 10/07/2021  02/02/21   Jonelle Sidle, MD  furosemide (LASIX) 20 MG tablet TAKE (1) TABLET BY MOUTH ONCE DAILY. Patient taking differently: Take 20 mg by mouth daily as needed for fluid. 05/25/21   Jonelle Sidle, MD  HYDROcodone-acetaminophen (NORCO/VICODIN) 5-325 MG tablet Take 1 tablet by mouth every 6 (six) hours as needed for moderate pain or severe pain. 10/11/21   Catarina Hartshorn, MD  metoprolol tartrate (LOPRESSOR) 25 MG tablet Take 1 tablet (25 mg total) by mouth 2 (two) times daily. 10/13/21   Catarina Hartshorn, MD      Allergies    Codeine    Review of Systems   Review of Systems  Constitutional:  Negative for chills and fever.  HENT:  Negative for congestion and rhinorrhea.   Respiratory:  Positive for cough. Negative for shortness of breath.   Cardiovascular:  Negative for chest pain.  Gastrointestinal:  Negative for abdominal pain, constipation, diarrhea, nausea and vomiting.  Genitourinary:  Negative for dysuria.       Reports vaginal itching    Physical Exam Updated Vital Signs BP 132/74   Pulse 87   Temp 98.2 F (36.8 C) (Oral)   Resp 17   Ht 5\' 5"  (1.651 m)   Wt 59 kg   SpO2 94%   BMI 21.63 kg/m  Physical Exam Vitals and nursing note reviewed. Exam conducted with a chaperone present Jeanella Anton, Charity fundraiser).  Constitutional:      General:  She is not in acute distress.    Appearance: She is not toxic-appearing.  HENT:     Nose: Nose normal.     Mouth/Throat:     Mouth: Mucous membranes are moist.  Cardiovascular:     Rate and Rhythm: Normal rate. Rhythm irregular.     Pulses: Normal pulses.  Pulmonary:     Effort: Pulmonary effort is normal. No respiratory distress.     Comments: Some slight end expiratory wheezing at the bilateral lower lung fields. She is speaking in full sentences with ease. No resp distress.  Abdominal:     Tenderness: There is no abdominal tenderness. There is no guarding or rebound.  Genitourinary:    Comments: Thick, white/tan plaques adhered to the inner  labia majora. There was thick tan discharge present in the brief as well. Foul smelling urine.  Musculoskeletal:     Cervical back: Normal range of motion.     Right lower leg: No edema.     Left lower leg: No edema.  Skin:    General: Skin is warm and dry.  Neurological:     Mental Status: She is alert and oriented to person, place, and time.     GCS: GCS eye subscore is 4. GCS verbal subscore is 5. GCS motor subscore is 6.     Cranial Nerves: No cranial nerve deficit or facial asymmetry.     Comments: Moving extremities      ED Results / Procedures / Treatments   Labs (all labs ordered are listed, but only abnormal results are displayed) Labs Reviewed  COMPREHENSIVE METABOLIC PANEL - Abnormal; Notable for the following components:      Result Value   Sodium 131 (*)    Glucose, Bld 108 (*)    Albumin 3.3 (*)    GFR, Estimated 53 (*)    All other components within normal limits  URINALYSIS, W/ REFLEX TO CULTURE (INFECTION SUSPECTED) - Abnormal; Notable for the following components:   Color, Urine AMBER (*)    APPearance TURBID (*)    Hgb urine dipstick MODERATE (*)    Protein, ur 30 (*)    Leukocytes,Ua LARGE (*)    Bacteria, UA FEW (*)    All other components within normal limits  CBG MONITORING, ED - Abnormal; Notable for the following components:   Glucose-Capillary 107 (*)    All other components within normal limits  URINE CULTURE  CBC  AMMONIA  TROPONIN I (HIGH SENSITIVITY)  TROPONIN I (HIGH SENSITIVITY)    EKG EKG Interpretation  Date/Time:  Monday December 06 2022 16:27:23 EDT Ventricular Rate:  88 PR Interval:    QRS Duration: 97 QT Interval:  400 QTC Calculation: 487 R Axis:   44 Text Interpretation: Atrial fibrillation Borderline low voltage, extremity leads Minimal ST depression, lateral leads Borderline prolonged QT interval Confirmed by Vivien Rossetti ([id number]) on 12/07/2022 3:40:14 PM  Radiology DG Chest Portable 1 View  Result Date:  12/06/2022 CLINICAL DATA:  Shortness of breath EXAM: PORTABLE CHEST 1 VIEW COMPARISON:  X-ray 10/07/2021 and older FINDINGS: Enlarged cardiopericardial silhouette. Calcified aorta. No pneumothorax, effusion. Chronic interstitial lung changes. Eventration of the right hemidiaphragm. No consolidation or edema. Overlapping cardiac leads. Fixation hardware seen along the lower cervical spine at the edge of the imaging field. IMPRESSION: Hyperinflation.  Enlarged heart with chronic lung changes. Electronically Signed   By: Karen Kays M.D.   On: 12/06/2022 17:26    Procedures Procedures   Medications Ordered in ED Medications  ipratropium-albuterol (DUONEB) 0.5-2.5 (3) MG/3ML nebulizer solution 3 mL (3 mLs Nebulization Given 12/07/22 0118)  cefTRIAXone (ROCEPHIN) 1 g in sodium chloride 0.9 % 100 mL IVPB (0 g Intravenous Stopped 12/07/22 0224)  methylPREDNISolone sodium succinate (SOLU-MEDROL) 125 mg/2 mL injection 125 mg (125 mg Intravenous Given 12/07/22 0223)    ED Course/ Medical Decision Making/ A&P                            Medical Decision Making Amount and/or Complexity of Data Reviewed Labs: ordered. Radiology: ordered.  Risk Prescription drug management.   89 y.o. female presents to the ER for evaluation of cough and vaginal itching. Differential diagnosis includes but is not limited to Upper respiratory infection, lower respiratory infection, allergies, asthma, irritants, foreign body, medications (ACE inhibitors), reflux, CHF, lung cancer, interstitial lung disease, psychiatric causes, postnasal drip, yeast infection, vaginitis, UTI. Vital signs unremarkable. Physical exam as noted above.   The patient denies any SOB. She was trialed off oxygen and has maintained her O2 sat between 94-96% on room air. DIL reports that she is bed/wheelchair bound and is not ambulatory.   CXR shows hyperinflation.  Enlarged heart with chronic lung changes.   I independently reviewed and interpreted  the patient's labs.  CMP shows mildly decreased sodium 131, mildly increase glucose at 108.  Albumin at 3.3.  No other electrolyte or LFT abnormality.  CBC with a leukocytosis or anemia.  Ammonia within normal limits.  Urinalysis is amber and turbid urine with mod amount of hemoglobin.  There is 30 protein present.  There is large leukocytes with greater than 50 white blood cells, 6-10 red blood cells, few bacteria and white blood cell clumps present as well.  Consistent with infection.  Urine culture pending.  Wet prep shows greater than 5 blood cells but no yeast or clue cells present.  Troponin at 15 with repeat at 16.  EKG reviewed and interpreted by attending and read as Atrial fibrillation Borderline low voltage, extremity leads Minimal ST depression, lateral leads Borderline prolonged QT.  The patient was given rocephin for UTI and solumedrol and duoneb for wheezing.   On re-evaluation, the patient is still maintaining good O2 saturations on room air. The patient reports that she feels fine. Her lung sounds have improved. I think this is more of a bronchitis picture. I will send her home with medrol dosepack. She does not appear in fluid overload. She is not complaining of SOB or chest pain. Her troponins are reassuring. She is on Elliquis, so I have a low suspicion of PE. DIL reports that the patient has barely coughed since being here and thinks the duonebs are helping her.   For her vaginal exam, concern for malignancy given the raised white plaques. Question if these are macerated scabs as well possible. Her DIL reports that she is constant itching her vaginal area. Regardless, I do not think this need emergent admission and she can follow up with gynecology outpatient. DIL and patient are in agreeance to this. DIL would like for me to call [facility] to stress the importance of management of this.   The patient does not meet sepsis criteria. She is alert and oriented for me and at baseline  according to her DIL. These transient confusion episodes likely from UTI. I have a lower suspicion for stroke or TIA. Offered admission with the UTI and bronchitis, however the patient would like to manage this from her  facility. Her O2 saturations are within normal limits on room air. She reports that she is feeling better and appears to be in no acute distress. Will discharge back to facility for management.  I discussed with Morrie Sheldon, RN at facility that the patient can be continued on duonebs for bronchitis, but may need to be more scheduled instead of just as needed. I discussed with her peri-care for the vagina given the irritation and plaques seen as requested by the DIL. Will send in some nystatin cream and recommended Desitin cream and keeping the area as dry as possible. Discussed with her that she does have a UTI as well that I am treating with Omnicef. Facility nursing staff request paper prescriptions. They are aware that I have attached these to the discharge paperwork.   We discussed the results of the labs/imaging. The plan is follow up gynecology, take antibiotic for UTI, wound management, take steroid for bronchitis . We discussed strict return precautions and red flag symptoms. The patient and family verbalized their understanding and agrees to the plan. The patient is stable and being discharged home in good condition.  Portions of this report may have been transcribed using voice recognition software. Every effort was made to ensure accuracy; however, inadvertent computerized transcription errors may be present.   Final Clinical Impression(s) / ED Diagnoses Final diagnoses:  Acute cystitis with hematuria  Vaginal sore  Bronchitis    Rx / DC Orders ED Discharge Orders          Ordered    nystatin cream (MYCOSTATIN)        12/07/22 0256    methylPREDNISolone (MEDROL DOSEPAK) 4 MG TBPK tablet  Daily        12/07/22 0256    cefdinir (OMNICEF) 300 MG capsule  2 times daily         12/07/22 0256              Achille Rich, PA-C 12/08/22 1440    Edwin Dada P, DO 12/13/22 1350

## 2022-12-09 LAB — URINE CULTURE: Culture: >=100000 — AB

## 2022-12-10 ENCOUNTER — Telehealth (HOSPITAL_BASED_OUTPATIENT_CLINIC_OR_DEPARTMENT_OTHER): Payer: Self-pay

## 2022-12-10 DIAGNOSIS — J189 Pneumonia, unspecified organism: Secondary | ICD-10-CM | POA: Diagnosis not present

## 2022-12-10 NOTE — Telephone Encounter (Signed)
Post ED Visit - Positive Culture Follow-up  Culture report reviewed by antimicrobial stewardship pharmacist: [facility] [x]  Daylene Posey, Pharm.D. []  Celedonio Miyamoto, Pharm.D., BCPS AQ-ID []  Garvin Fila, Pharm.D., BCPS []  Georgina Pillion, [address].D., BCPS []  Parole, [address].D., BCPS, AAHIVP []  Estella Husk, Pharm.D., BCPS, AAHIVP []  Lysle Pearl, PharmD, BCPS []  Phillips Climes, PharmD, BCPS []  Agapito Games, PharmD, BCPS []  Verlan Friends, PharmD []  Mervyn Gay, PharmD, BCPS []  Vinnie Level, PharmD  [facility] []  Len Childs, PharmD []  Greer Pickerel, PharmD []  Adalberto Cole, PharmD []  Perlie Gold, Rph []  Lonell Face) Jean Rosenthal, PharmD []  Earl Many, PharmD []  Junita Push, PharmD []  Dorna Leitz, PharmD []  Terrilee Files, PharmD []  Lynann Beaver, PharmD []  Keturah Barre, PharmD []  Loralee Pacas, PharmD []  Bernadene Person, PharmD   Positive Urine  culture Treated with Cefdinir, organism sensitive to the same and no further patient follow-up is required at this time.  Sandria Senter 12/10/2022, 10:24 AM

## 2022-12-16 ENCOUNTER — Encounter: Payer: Self-pay | Admitting: Obstetrics & Gynecology

## 2022-12-16 ENCOUNTER — Ambulatory Visit: Payer: PPO | Admitting: Obstetrics & Gynecology

## 2022-12-16 VITALS — BP 115/73 | HR 66

## 2022-12-16 DIAGNOSIS — N904 Leukoplakia of vulva: Secondary | ICD-10-CM

## 2022-12-16 DIAGNOSIS — N763 Subacute and chronic vulvitis: Secondary | ICD-10-CM

## 2022-12-16 MED ORDER — GERHARDT'S BUTT CREAM: TOPICAL_CREAM | Freq: Three times a day (TID) | CUTANEOUS | Status: AC

## 2022-12-16 NOTE — Progress Notes (Signed)
Chief Complaint  Patient presents with   Vaginal Discharge    White patch on vaginal area      89 y.o. G2P2 No LMP recorded. Patient has had a hysterectomy. The current method of family planning is status post hysterectomy.  Outpatient Encounter Medications as of 12/16/2022  Medication Sig   apixaban (ELIQUIS) 5 MG TABS tablet Take 1 tablet (5 mg total) by mouth 2 (two) times daily.   diltiazem (CARDIZEM CD) 240 MG 24 hr capsule TAKE (1) CAPSULE BY MOUTH ONCE DAILY.   furosemide (LASIX) 20 MG tablet TAKE (1) TABLET BY MOUTH ONCE DAILY. (Patient taking differently: Take 20 mg by mouth daily as needed for fluid.)   HYDROcodone-acetaminophen (NORCO/VICODIN) 5-325 MG tablet Take 1 tablet by mouth every 6 (six) hours as needed for moderate pain or severe pain.   metoprolol tartrate (LOPRESSOR) 25 MG tablet Take 1 tablet (25 mg total) by mouth 2 (two) times daily.   nystatin cream (MYCOSTATIN) Apply to affected area 2 times daily   [DISCONTINUED] cefdinir (OMNICEF) 300 MG capsule Take 1 capsule (300 mg total) by mouth 2 (two) times daily. (Patient not taking: Reported on 12/16/2022)   Facility-Administered Encounter Medications as of 12/16/2022  Medication   Gerhardt's butt cream    Subjective Nursing home bound pt Was evaluated for discharge and found white lesion-->exam consistent with LSA  But has pad vulvitis chronically Past Medical History:  Diagnosis Date   Ankle fracture 05/22/2013   Anxiety    Arthritis    Atrial fibrillation (HCC)    Constipation    Dermatitis of lower extremity 11/18/2015   GERD (gastroesophageal reflux disease)    Hyperlipemia    Macular degeneration    Nodule of left lung    PSVT (paroxysmal supraventricular tachycardia) 03/05/2013   Vaginal atrophy     Past Surgical History:  Procedure Laterality Date   ABDOMINAL HYSTERECTOMY     BRONCHIAL BIOPSY N/A 06/11/2013   Procedure: TRANSBRONCHIAL BIOPSIES;  Surgeon: Delight Ovens, MD;   Location: [facility] OR;  Service: Thoracic;  Laterality: N/A;   Cataract surgery     bilaterally   CERVICAL DISC SURGERY     CHOLECYSTECTOMY  01/26/2012   Procedure: LAPAROSCOPIC CHOLECYSTECTOMY;  Surgeon: [name], MD;  Location: [facility];  Service: General;  Laterality: N/A;   COLONOSCOPY  04/30/2011   Procedure: COLONOSCOPY;  Surgeon: Malissa Hippo, MD;  Location: [facility];  Service: Endoscopy;  Laterality: N/A;  8:30 am   ENDOBRONCHIAL ULTRASOUND N/A 06/11/2013   Procedure: ENDOBRONCHIAL ULTRASOUND;  Surgeon: Delight Ovens, MD;  Location: [facility] OR;  Service: Thoracic;  Laterality: N/A;   ESOPHAGOGASTRODUODENOSCOPY  05/27/2011   Procedure: ESOPHAGOGASTRODUODENOSCOPY (EGD);  Surgeon: Malissa Hippo, MD;  [facility]: [facility];  Service: Endoscopy;  Laterality: N/A;  300   HEMORRHOID SURGERY     KNEE ARTHROSCOPY  2010   left-[facility]   ORIF WRIST FRACTURE Left 12/04/2014   Procedure: OPEN REDUCTION INTERNAL FIXATION LEFT WRIST FRACTURE;  Surgeon: Vickki Hearing, MD;  Location: [facility];  Service: Orthopedics;  Laterality: Left;   TRIGGER FINGER RELEASE     right   VIDEO BRONCHOSCOPY WITH ENDOBRONCHIAL NAVIGATION N/A 06/11/2013   Procedure: VIDEO BRONCHOSCOPY WITH ENDOBRONCHIAL NAVIGATION;  Surgeon: Delight Ovens, MD;  Location: [facility] OR;  Service: Thoracic;  Laterality: N/A;    OB History     Gravida  2   Para  2   Term  Preterm      AB      Living  2      SAB      IAB      Ectopic      Multiple      Live Births              Allergies  Allergen Reactions   Codeine Nausea And Vomiting    Social History   Socioeconomic History   Marital status: Widowed    Spouse name: Not on file   Number of children: Not on file   Years of education: Not on file   Highest education level: Not on file  Occupational History   Not on file  Tobacco Use   Smoking status: Never   Smokeless tobacco: Never  Vaping Use   Vaping Use: Never used  Substance  and Sexual Activity   Alcohol use: No    Alcohol/week: 0.0 standard drinks of alcohol   Drug use: No   Sexual activity: Not Currently    Birth control/protection: Surgical    Comment: hyst  Other Topics Concern   Not on file  Social History Narrative   Not on file   Social Determinants of Health   Financial Resource Strain: Not on file  Food Insecurity: Not on file  Transportation Needs: Not on file  Physical Activity: Not on file  Stress: Not on file  Social Connections: Not on file    Family History  Problem Relation Age of Onset   Heart disease Mother    Heart disease Father    Cancer Son        prostate   Cancer Son        throat   Colon cancer Neg Hx     Medications:       Current Outpatient Medications:    apixaban (ELIQUIS) 5 MG TABS tablet, Take 1 tablet (5 mg total) by mouth 2 (two) times daily., Disp: 84 tablet, Rfl: 0   diltiazem (CARDIZEM CD) 240 MG 24 hr capsule, TAKE (1) CAPSULE BY MOUTH ONCE DAILY., Disp: 30 capsule, Rfl: 0   furosemide (LASIX) 20 MG tablet, TAKE (1) TABLET BY MOUTH ONCE DAILY. (Patient taking differently: Take 20 mg by mouth daily as needed for fluid.), Disp: 30 tablet, Rfl: 0   HYDROcodone-acetaminophen (NORCO/VICODIN) 5-325 MG tablet, Take 1 tablet by mouth every 6 (six) hours as needed for moderate pain or severe pain., Disp: 12 tablet, Rfl: 0   metoprolol tartrate (LOPRESSOR) 25 MG tablet, Take 1 tablet (25 mg total) by mouth 2 (two) times daily., Disp: , Rfl:    nystatin cream (MYCOSTATIN), Apply to affected area 2 times daily, Disp: 30 g, Rfl: 0  Current Facility-Administered Medications:    Gerhardt's butt cream, , Topical, TID, Orlandis Sanden, Amaryllis Dyke, MD  Objective Blood pressure 115/73, pulse 66.  Chronic pad vulvitis with area of lichen sclerosus, no vuvlar cancer  Pertinent ROS No burning with urination, frequency or urgency No nausea, vomiting or diarrhea Nor fever chills or other constitutional symptoms   Labs or  studies     Impression + Management Plan: Diagnoses this Encounter::   ICD-10-CM   1. Chronic vulvitis, wet pad related  N76.3    Rx Fanny cream    2. Lichen sclerosus of female genitalia  N90.4         Medications prescribed during  this encounter: Meds ordered this encounter  Medications   Gerhardt's butt cream    Compounded at  [employer] or Scans Ordered during this encounter: No orders of the defined types were placed in this encounter.     Follow up No follow-ups on file.

## 2022-12-24 DIAGNOSIS — F411 Generalized anxiety disorder: Secondary | ICD-10-CM | POA: Diagnosis not present

## 2023-01-03 DIAGNOSIS — I10 Essential (primary) hypertension: Secondary | ICD-10-CM | POA: Diagnosis not present

## 2023-01-03 DIAGNOSIS — I2510 Atherosclerotic heart disease of native coronary artery without angina pectoris: Secondary | ICD-10-CM | POA: Diagnosis not present

## 2023-01-03 DIAGNOSIS — R600 Localized edema: Secondary | ICD-10-CM | POA: Diagnosis not present

## 2023-01-03 DIAGNOSIS — I480 Paroxysmal atrial fibrillation: Secondary | ICD-10-CM | POA: Diagnosis not present

## 2023-01-12 DIAGNOSIS — B351 Tinea unguium: Secondary | ICD-10-CM | POA: Diagnosis not present

## 2023-01-12 DIAGNOSIS — M79675 Pain in left toe(s): Secondary | ICD-10-CM | POA: Diagnosis not present

## 2023-01-12 DIAGNOSIS — M79674 Pain in right toe(s): Secondary | ICD-10-CM | POA: Diagnosis not present

## 2023-01-28 DIAGNOSIS — I2510 Atherosclerotic heart disease of native coronary artery without angina pectoris: Secondary | ICD-10-CM | POA: Diagnosis not present

## 2023-01-28 DIAGNOSIS — I10 Essential (primary) hypertension: Secondary | ICD-10-CM | POA: Diagnosis not present

## 2023-01-28 DIAGNOSIS — I480 Paroxysmal atrial fibrillation: Secondary | ICD-10-CM | POA: Diagnosis not present

## 2023-01-28 DIAGNOSIS — F411 Generalized anxiety disorder: Secondary | ICD-10-CM | POA: Diagnosis not present

## 2023-01-28 DIAGNOSIS — R600 Localized edema: Secondary | ICD-10-CM | POA: Diagnosis not present

## 2023-02-07 DIAGNOSIS — H3530 Unspecified macular degeneration: Secondary | ICD-10-CM | POA: Diagnosis not present

## 2023-02-07 DIAGNOSIS — E782 Mixed hyperlipidemia: Secondary | ICD-10-CM | POA: Diagnosis not present

## 2023-02-07 DIAGNOSIS — K219 Gastro-esophageal reflux disease without esophagitis: Secondary | ICD-10-CM | POA: Diagnosis not present

## 2023-02-17 DIAGNOSIS — N390 Urinary tract infection, site not specified: Secondary | ICD-10-CM | POA: Diagnosis not present

## 2023-02-25 DIAGNOSIS — F411 Generalized anxiety disorder: Secondary | ICD-10-CM | POA: Diagnosis not present

## 2023-03-07 DIAGNOSIS — I480 Paroxysmal atrial fibrillation: Secondary | ICD-10-CM | POA: Diagnosis not present

## 2023-03-07 DIAGNOSIS — I2510 Atherosclerotic heart disease of native coronary artery without angina pectoris: Secondary | ICD-10-CM | POA: Diagnosis not present

## 2023-03-07 DIAGNOSIS — R600 Localized edema: Secondary | ICD-10-CM | POA: Diagnosis not present

## 2023-03-07 DIAGNOSIS — I10 Essential (primary) hypertension: Secondary | ICD-10-CM | POA: Diagnosis not present

## 2023-03-29 DIAGNOSIS — F411 Generalized anxiety disorder: Secondary | ICD-10-CM | POA: Diagnosis not present

## 2023-03-31 DIAGNOSIS — I10 Essential (primary) hypertension: Secondary | ICD-10-CM | POA: Diagnosis not present

## 2023-03-31 DIAGNOSIS — M79674 Pain in right toe(s): Secondary | ICD-10-CM | POA: Diagnosis not present

## 2023-03-31 DIAGNOSIS — B351 Tinea unguium: Secondary | ICD-10-CM | POA: Diagnosis not present

## 2023-03-31 DIAGNOSIS — M79675 Pain in left toe(s): Secondary | ICD-10-CM | POA: Diagnosis not present

## 2023-03-31 DIAGNOSIS — I480 Paroxysmal atrial fibrillation: Secondary | ICD-10-CM | POA: Diagnosis not present

## 2023-03-31 DIAGNOSIS — G894 Chronic pain syndrome: Secondary | ICD-10-CM | POA: Diagnosis not present

## 2023-04-11 DIAGNOSIS — E782 Mixed hyperlipidemia: Secondary | ICD-10-CM | POA: Diagnosis not present

## 2023-04-11 DIAGNOSIS — K219 Gastro-esophageal reflux disease without esophagitis: Secondary | ICD-10-CM | POA: Diagnosis not present

## 2023-04-11 DIAGNOSIS — H3530 Unspecified macular degeneration: Secondary | ICD-10-CM | POA: Diagnosis not present

## 2023-04-18 DIAGNOSIS — F411 Generalized anxiety disorder: Secondary | ICD-10-CM | POA: Diagnosis not present

## 2023-05-09 DIAGNOSIS — I480 Paroxysmal atrial fibrillation: Secondary | ICD-10-CM | POA: Diagnosis not present

## 2023-05-09 DIAGNOSIS — I2510 Atherosclerotic heart disease of native coronary artery without angina pectoris: Secondary | ICD-10-CM | POA: Diagnosis not present

## 2023-05-09 DIAGNOSIS — I10 Essential (primary) hypertension: Secondary | ICD-10-CM | POA: Diagnosis not present

## 2023-05-16 DIAGNOSIS — U071 COVID-19: Secondary | ICD-10-CM | POA: Diagnosis not present

## 2023-05-16 DIAGNOSIS — J069 Acute upper respiratory infection, unspecified: Secondary | ICD-10-CM | POA: Diagnosis not present

## 2023-05-17 DIAGNOSIS — J069 Acute upper respiratory infection, unspecified: Secondary | ICD-10-CM | POA: Diagnosis not present

## 2023-05-17 DIAGNOSIS — U071 COVID-19: Secondary | ICD-10-CM | POA: Diagnosis not present

## 2023-06-01 DIAGNOSIS — I10 Essential (primary) hypertension: Secondary | ICD-10-CM | POA: Diagnosis not present

## 2023-06-01 DIAGNOSIS — K219 Gastro-esophageal reflux disease without esophagitis: Secondary | ICD-10-CM | POA: Diagnosis not present

## 2023-06-01 DIAGNOSIS — I480 Paroxysmal atrial fibrillation: Secondary | ICD-10-CM | POA: Diagnosis not present

## 2023-06-16 DIAGNOSIS — F0390 Unspecified dementia without behavioral disturbance: Secondary | ICD-10-CM | POA: Diagnosis not present

## 2023-06-16 DIAGNOSIS — I480 Paroxysmal atrial fibrillation: Secondary | ICD-10-CM | POA: Diagnosis not present

## 2023-06-16 DIAGNOSIS — I10 Essential (primary) hypertension: Secondary | ICD-10-CM | POA: Diagnosis not present

## 2023-06-23 DIAGNOSIS — J208 Acute bronchitis due to other specified organisms: Secondary | ICD-10-CM | POA: Diagnosis not present

## 2023-06-25 DIAGNOSIS — R059 Cough, unspecified: Secondary | ICD-10-CM | POA: Diagnosis not present

## 2023-06-28 DIAGNOSIS — B351 Tinea unguium: Secondary | ICD-10-CM | POA: Diagnosis not present

## 2023-06-28 DIAGNOSIS — M79675 Pain in left toe(s): Secondary | ICD-10-CM | POA: Diagnosis not present

## 2023-06-28 DIAGNOSIS — M79674 Pain in right toe(s): Secondary | ICD-10-CM | POA: Diagnosis not present

## 2023-07-06 DIAGNOSIS — R062 Wheezing: Secondary | ICD-10-CM | POA: Diagnosis not present

## 2023-07-06 DIAGNOSIS — R058 Other specified cough: Secondary | ICD-10-CM | POA: Diagnosis not present

## 2023-07-11 DIAGNOSIS — R058 Other specified cough: Secondary | ICD-10-CM | POA: Diagnosis not present

## 2023-07-11 DIAGNOSIS — R062 Wheezing: Secondary | ICD-10-CM | POA: Diagnosis not present

## 2023-07-22 DIAGNOSIS — R52 Pain, unspecified: Secondary | ICD-10-CM | POA: Diagnosis not present

## 2023-08-04 DIAGNOSIS — I2510 Atherosclerotic heart disease of native coronary artery without angina pectoris: Secondary | ICD-10-CM | POA: Diagnosis not present

## 2023-08-04 DIAGNOSIS — F0390 Unspecified dementia without behavioral disturbance: Secondary | ICD-10-CM | POA: Diagnosis not present

## 2023-08-04 DIAGNOSIS — I10 Essential (primary) hypertension: Secondary | ICD-10-CM | POA: Diagnosis not present

## 2023-08-04 DIAGNOSIS — I480 Paroxysmal atrial fibrillation: Secondary | ICD-10-CM | POA: Diagnosis not present

## 2023-08-09 DIAGNOSIS — E782 Mixed hyperlipidemia: Secondary | ICD-10-CM | POA: Diagnosis not present

## 2023-08-09 DIAGNOSIS — I10 Essential (primary) hypertension: Secondary | ICD-10-CM | POA: Diagnosis not present

## 2023-08-09 DIAGNOSIS — I2510 Atherosclerotic heart disease of native coronary artery without angina pectoris: Secondary | ICD-10-CM | POA: Diagnosis not present

## 2023-08-09 DIAGNOSIS — I480 Paroxysmal atrial fibrillation: Secondary | ICD-10-CM | POA: Diagnosis not present

## 2023-09-05 DIAGNOSIS — K219 Gastro-esophageal reflux disease without esophagitis: Secondary | ICD-10-CM | POA: Diagnosis not present

## 2023-09-05 DIAGNOSIS — H3530 Unspecified macular degeneration: Secondary | ICD-10-CM | POA: Diagnosis not present

## 2023-09-05 DIAGNOSIS — E782 Mixed hyperlipidemia: Secondary | ICD-10-CM | POA: Diagnosis not present

## 2023-09-12 DIAGNOSIS — I480 Paroxysmal atrial fibrillation: Secondary | ICD-10-CM | POA: Diagnosis not present

## 2023-09-12 DIAGNOSIS — I2510 Atherosclerotic heart disease of native coronary artery without angina pectoris: Secondary | ICD-10-CM | POA: Diagnosis not present

## 2023-09-12 DIAGNOSIS — I10 Essential (primary) hypertension: Secondary | ICD-10-CM | POA: Diagnosis not present

## 2023-09-13 DIAGNOSIS — B351 Tinea unguium: Secondary | ICD-10-CM | POA: Diagnosis not present

## 2023-09-13 DIAGNOSIS — M79674 Pain in right toe(s): Secondary | ICD-10-CM | POA: Diagnosis not present

## 2023-09-13 DIAGNOSIS — M79675 Pain in left toe(s): Secondary | ICD-10-CM | POA: Diagnosis not present

## 2023-09-24 DIAGNOSIS — N390 Urinary tract infection, site not specified: Secondary | ICD-10-CM | POA: Diagnosis not present

## 2023-09-28 DIAGNOSIS — B3749 Other urogenital candidiasis: Secondary | ICD-10-CM | POA: Diagnosis not present

## 2023-10-03 DIAGNOSIS — I2510 Atherosclerotic heart disease of native coronary artery without angina pectoris: Secondary | ICD-10-CM | POA: Diagnosis not present

## 2023-10-03 DIAGNOSIS — I480 Paroxysmal atrial fibrillation: Secondary | ICD-10-CM | POA: Diagnosis not present

## 2023-10-03 DIAGNOSIS — I10 Essential (primary) hypertension: Secondary | ICD-10-CM | POA: Diagnosis not present

## 2023-10-07 ENCOUNTER — Emergency Department (HOSPITAL_COMMUNITY)
Admission: EM | Admit: 2023-10-07 | Discharge: 2023-10-08 | Disposition: A | Discharge status: Skilled Nursing Facility | Attending: Emergency Medicine | Admitting: Emergency Medicine

## 2023-10-07 ENCOUNTER — Other Ambulatory Visit: Payer: Self-pay

## 2023-10-07 ENCOUNTER — Emergency Department (HOSPITAL_COMMUNITY)

## 2023-10-07 ENCOUNTER — Encounter (HOSPITAL_COMMUNITY): Payer: Self-pay

## 2023-10-07 DIAGNOSIS — Z7901 Long term (current) use of anticoagulants: Secondary | ICD-10-CM | POA: Insufficient documentation

## 2023-10-07 DIAGNOSIS — W06XXXA Fall from bed, initial encounter: Secondary | ICD-10-CM | POA: Diagnosis not present

## 2023-10-07 DIAGNOSIS — S0990XA Unspecified injury of head, initial encounter: Secondary | ICD-10-CM | POA: Diagnosis not present

## 2023-10-07 DIAGNOSIS — S82201A Unspecified fracture of shaft of right tibia, initial encounter for closed fracture: Secondary | ICD-10-CM | POA: Insufficient documentation

## 2023-10-07 DIAGNOSIS — S99911A Unspecified injury of right ankle, initial encounter: Secondary | ICD-10-CM | POA: Diagnosis present

## 2023-10-07 DIAGNOSIS — S82401A Unspecified fracture of shaft of right fibula, initial encounter for closed fracture: Secondary | ICD-10-CM | POA: Diagnosis not present

## 2023-10-07 DIAGNOSIS — Z043 Encounter for examination and observation following other accident: Secondary | ICD-10-CM | POA: Diagnosis not present

## 2023-10-07 DIAGNOSIS — M160 Bilateral primary osteoarthritis of hip: Secondary | ICD-10-CM | POA: Diagnosis not present

## 2023-10-07 DIAGNOSIS — M25571 Pain in right ankle and joints of right foot: Secondary | ICD-10-CM | POA: Diagnosis not present

## 2023-10-07 DIAGNOSIS — R918 Other nonspecific abnormal finding of lung field: Secondary | ICD-10-CM | POA: Diagnosis not present

## 2023-10-07 DIAGNOSIS — R609 Edema, unspecified: Secondary | ICD-10-CM | POA: Diagnosis not present

## 2023-10-07 DIAGNOSIS — F0390 Unspecified dementia without behavioral disturbance: Secondary | ICD-10-CM | POA: Insufficient documentation

## 2023-10-07 DIAGNOSIS — S199XXA Unspecified injury of neck, initial encounter: Secondary | ICD-10-CM | POA: Diagnosis not present

## 2023-10-07 DIAGNOSIS — W19XXXA Unspecified fall, initial encounter: Secondary | ICD-10-CM | POA: Diagnosis not present

## 2023-10-07 DIAGNOSIS — I10 Essential (primary) hypertension: Secondary | ICD-10-CM | POA: Diagnosis not present

## 2023-10-07 DIAGNOSIS — G9389 Other specified disorders of brain: Secondary | ICD-10-CM | POA: Diagnosis not present

## 2023-10-07 DIAGNOSIS — S32511A Fracture of superior rim of right pubis, initial encounter for closed fracture: Secondary | ICD-10-CM | POA: Diagnosis not present

## 2023-10-07 LAB — CBC WITH DIFFERENTIAL/PLATELET
Abs Immature Granulocytes: 0.05 10*3/uL (ref 0.00–0.07)
Basophils Absolute: 0.1 10*3/uL (ref 0.0–0.1)
Basophils Relative: 1 %
Eosinophils Absolute: 0.1 10*3/uL (ref 0.0–0.5)
Eosinophils Relative: 1 %
HCT: 43.9 % (ref 36.0–46.0)
Hemoglobin: 14.4 g/dL (ref 12.0–15.0)
Immature Granulocytes: 1 %
Lymphocytes Relative: 20 %
Lymphs Abs: 1.3 10*3/uL (ref 0.7–4.0)
MCH: 30.3 pg (ref 26.0–34.0)
MCHC: 32.8 g/dL (ref 30.0–36.0)
MCV: 92.2 fL (ref 80.0–100.0)
Monocytes Absolute: 0.4 10*3/uL (ref 0.1–1.0)
Monocytes Relative: 6 %
Neutro Abs: 4.7 10*3/uL (ref 1.7–7.7)
Neutrophils Relative %: 71 %
Platelets: 253 10*3/uL (ref 150–400)
RBC: 4.76 MIL/uL (ref 3.87–5.11)
RDW: 13.4 % (ref 11.5–15.5)
WBC: 6.5 10*3/uL (ref 4.0–10.5)
nRBC: 0 % (ref 0.0–0.2)

## 2023-10-07 LAB — BASIC METABOLIC PANEL WITH GFR
Anion gap: 11 (ref 5–15)
BUN: 23 mg/dL (ref 8–23)
CO2: 23 mmol/L (ref 22–32)
Calcium: 9.5 mg/dL (ref 8.9–10.3)
Chloride: 102 mmol/L (ref 98–111)
Creatinine, Ser: 1.11 mg/dL — ABNORMAL HIGH (ref 0.44–1.00)
GFR, Estimated: 46 mL/min — ABNORMAL LOW (ref >60)
Glucose, Bld: 125 mg/dL — ABNORMAL HIGH (ref 70–99)
Potassium: 3.8 mmol/L (ref 3.5–5.1)
Sodium: 136 mmol/L (ref 135–145)

## 2023-10-07 MED ORDER — ONDANSETRON HCL 4 MG/2ML IJ SOLN
4.0000 mg | Freq: Once | INTRAMUSCULAR | Status: AC
  Administered 2023-10-07: 4 mg via INTRAVENOUS
  Filled 2023-10-07: qty 2

## 2023-10-07 MED ORDER — MORPHINE SULFATE (PF) 2 MG/ML IV SOLN
2.0000 mg | Freq: Once | INTRAVENOUS | Status: AC
  Administered 2023-10-07: 2 mg via INTRAVENOUS
  Filled 2023-10-07: qty 1

## 2023-10-07 MED ORDER — SODIUM CHLORIDE 0.9 % IV BOLUS
500.0000 mL | Freq: Once | INTRAVENOUS | Status: AC
  Administered 2023-10-07: 500 mL via INTRAVENOUS

## 2023-10-07 NOTE — ED Provider Notes (Signed)
 [place] EMERGENCY DEPARTMENT AT [facility] Provider Note  CSN: [id number] Arrival date & time: 10/07/23 2126  Chief Complaint(s) Fall and Ankle Injury  HPI Marcia Woods is a 89 y.o. female with past medical history as below, significant for A-fib on DOAC, PSVT, GERD, wheelchair dependent who presents to the ED with complaint of fall, leg injury  She resides at [facility], reports she was trying to get into her bed, her foot got tangled up in the blanket and she fell to the ground.  Experienced severe pain to her right leg.  Unsure if she hit her head.  No LOC.  No chest pain abdominal pain, nausea or vomiting.  Felt she was in her normal state of health prior to the fall.  Past Medical History Past Medical History:  Diagnosis Date   Ankle fracture 05/22/2013   Anxiety    Arthritis    Atrial fibrillation (HCC)    Constipation    Dermatitis of lower extremity 11/18/2015   GERD (gastroesophageal reflux disease)    Hyperlipemia    Macular degeneration    Nodule of left lung    PSVT (paroxysmal supraventricular tachycardia) (HCC) 03/05/2013   Vaginal atrophy    Patient Active Problem List   Diagnosis Date Noted   Chronic anticoagulation    Pelvic fracture (HCC) 10/10/2021   Dementia without behavioral disturbance (HCC) 10/10/2021   Fall 10/07/2021   Dermatitis of lower extremity 11/18/2015   Fracture, radius, distal    Vaginal odor 01/29/2014   Leg edema 12/24/2013   Solitary lung nodule 06/28/2013   Permanent atrial fibrillation (HCC) 06/01/2013   PSVT (paroxysmal supraventricular tachycardia) (HCC) 03/05/2013   Palpitations 03/03/2013   Mixed hyperlipidemia 03/03/2013   GERD (gastroesophageal reflux disease) 03/03/2013   Home Medication(s) Prior to Admission medications   Medication Sig Start Date End Date Taking? Authorizing Provider  apixaban  (ELIQUIS ) 5 MG TABS tablet Take 1 tablet (5 mg total) by mouth 2 (two) times daily. 04/29/20   Debera Jayson MATSU, MD  diltiazem  (CARDIZEM  CD) 240 MG 24 hr capsule TAKE (1) CAPSULE BY MOUTH ONCE DAILY. 02/02/21   Debera Jayson MATSU, MD  furosemide  (LASIX ) 20 MG tablet TAKE (1) TABLET BY MOUTH ONCE DAILY. Patient taking differently: Take 20 mg by mouth daily as needed for fluid. 05/25/21   Debera Jayson MATSU, MD  HYDROcodone -acetaminophen  (NORCO/VICODIN) 5-325 MG tablet Take 1 tablet by mouth every 6 (six) hours as needed for moderate pain or severe pain. 10/11/21   Evonnie Lenis, MD  metoprolol  tartrate (LOPRESSOR ) 25 MG tablet Take 1 tablet (25 mg total) by mouth 2 (two) times daily. 10/13/21   Evonnie Lenis, MD  nystatin  cream (MYCOSTATIN ) Apply to affected area 2 times daily 12/07/22   Bernis Ernst, PA-C  Past Surgical History Past Surgical History:  Procedure Laterality Date   ABDOMINAL HYSTERECTOMY     BRONCHIAL BIOPSY N/A 06/11/2013   Procedure: TRANSBRONCHIAL BIOPSIES;  Surgeon: Dallas KATHEE Jude, MD;  Location: [facility] OR;  Service: Thoracic;  Laterality: N/A;   Cataract surgery     bilaterally   CERVICAL DISC SURGERY     CHOLECYSTECTOMY  01/26/2012   Procedure: LAPAROSCOPIC CHOLECYSTECTOMY;  Surgeon: Oneil DELENA Budge, MD;  Location: [facility];  Service: General;  Laterality: N/A;   COLONOSCOPY  04/30/2011   Procedure: COLONOSCOPY;  Surgeon: Claudis RAYMOND Rivet, MD;  [facility];  Service: Endoscopy;  Laterality: N/A;  8:30 am   ENDOBRONCHIAL ULTRASOUND N/A 06/11/2013   Procedure: ENDOBRONCHIAL ULTRASOUND;  Surgeon: Dallas KATHEE Jude, MD;  Location: [facility] OR;  Service: Thoracic;  Laterality: N/A;   ESOPHAGOGASTRODUODENOSCOPY  05/27/2011   Procedure: ESOPHAGOGASTRODUODENOSCOPY (EGD);  Surgeon: Claudis RAYMOND Rivet, MD;  [facility];  Service: Endoscopy;  Laterality: N/A;  300   HEMORRHOID SURGERY     KNEE ARTHROSCOPY  06/21/2008   left-[facility]   ORIF WRIST FRACTURE  Left 12/04/2014   Procedure: OPEN REDUCTION INTERNAL FIXATION LEFT WRIST FRACTURE;  Surgeon: Taft FORBES Minerva, MD;  Location: [facility];  Service: Orthopedics;  Laterality: Left;   TRIGGER FINGER RELEASE     right   VIDEO BRONCHOSCOPY WITH ENDOBRONCHIAL NAVIGATION N/A 06/11/2013   Procedure: VIDEO BRONCHOSCOPY WITH ENDOBRONCHIAL NAVIGATION;  Surgeon: Dallas KATHEE Jude, MD;  Location: [facility] OR;  Service: Thoracic;  Laterality: N/A;   Family History Family History  Problem Relation Age of Onset   Heart disease Mother    Heart disease Father    Cancer Son        prostate   Cancer Son        throat   Colon cancer Neg Hx     Social History Social History   Tobacco Use   Smoking status: Never   Smokeless tobacco: Never  Vaping Use   Vaping status: Never Used  Substance Use Topics   Alcohol use: No    Alcohol/week: 0.0 standard drinks of alcohol   Drug use: No   Allergies Codeine   Review of Systems A thorough review of systems was obtained and all systems are negative except as noted in the HPI and PMH.   Physical Exam Vital Signs  I have reviewed the triage vital signs BP (!) 147/84 (BP Location: Right Arm)   Pulse 78   Temp 98.3 F (36.8 C) (Oral)   Resp 14   Wt 75.2 kg   SpO2 95%   BMI 27.59 kg/m  Physical Exam Vitals and nursing note reviewed.  Constitutional:      General: She is not in acute distress.    Appearance: Normal appearance.  HENT:     Head: Normocephalic and atraumatic.     Right Ear: External ear normal.     Left Ear: External ear normal.     Nose: Nose normal.     Mouth/Throat:     Mouth: Mucous membranes are moist.  Eyes:     General: No scleral icterus.       Right eye: No discharge.        Left eye: No discharge.  Cardiovascular:     Rate and Rhythm: Normal rate and regular rhythm.     Pulses: Normal pulses.     Heart sounds: Normal heart sounds.  Pulmonary:     Effort: Pulmonary effort is normal. No respiratory  distress.     Breath  sounds: Normal breath sounds. No stridor.  Abdominal:     General: Abdomen is flat. There is no distension.     Palpations: Abdomen is soft.     Tenderness: There is no abdominal tenderness.  Musculoskeletal:     Cervical back: No rigidity.     Right lower leg: No edema.     Left lower leg: No edema.       Legs:     Comments: LE NVI ROM limited 2/2 pain No pain on palpation to knee b/l   Skin:    General: Skin is warm and dry.     Capillary Refill: Capillary refill takes less than 2 seconds.  Neurological:     Mental Status: She is alert.  Psychiatric:        Mood and Affect: Mood normal.        Behavior: Behavior normal. Behavior is cooperative.     ED Results and Treatments Labs (all labs ordered are listed, but only abnormal results are displayed) Labs Reviewed  BASIC METABOLIC PANEL WITH GFR - Abnormal; Notable for the following components:      Result Value   Glucose, Bld 125 (*)    Creatinine, Ser 1.11 (*)    GFR, Estimated 46 (*)    All other components within normal limits  CBC WITH DIFFERENTIAL/PLATELET                                                                                                                          Radiology CT Head Wo Contrast Result Date: 10/07/2023 CLINICAL DATA:  Head trauma, minor (Age >= 65y) doac; Neck trauma (Age >= 65y) EXAM: CT HEAD WITHOUT CONTRAST CT CERVICAL SPINE WITHOUT CONTRAST TECHNIQUE: Multidetector CT imaging of the head and cervical spine was performed following the standard protocol without intravenous contrast. Multiplanar CT image reconstructions of the cervical spine were also generated. RADIATION DOSE REDUCTION: This exam was performed according to the departmental dose-optimization program which includes automated exposure control, adjustment of the mA and/or kV according to patient size and/or use of iterative reconstruction technique. COMPARISON:  CT head and cervical spine April 18, 23. FINDINGS: CT HEAD FINDINGS  Brain: No evidence of acute infarction, hemorrhage, hydrocephalus, extra-axial collection or mass lesion/mass effect. Right frontal lobe encephalomalacia. Patchy white matter hypodensities, compatible with chronic microvascular ischemic disease. Cerebral atrophy. Vascular: No hyperdense vessel or unexpected calcification. Skull: No acute fracture. Sinuses/Orbits: No acute finding. CT CERVICAL SPINE FINDINGS Alignment: No substantial sagittal subluxation. Skull base and vertebrae: No evidence of acute fracture. Osteopenia. Soft tissues and spinal canal: No prevertebral fluid or swelling. No visible canal hematoma. Disc levels:  No significant degenerative change. Upper chest: Chronic consolidation in the left upper lobe, partially imaged and mildly progressed since 2015. IMPRESSION: 1. No evidence of acute intracranial abnormality or cervical spine fracture. 2. Chronic consolidation in the left upper lobe, partially imaged and mildly progressed since CT chest from June 05, 2014 (Please see that study for differential considerations). A follow-up CT of the chest with contrast could provide further evaluation if clinically warranted. Electronically Signed   By: Gilmore GORMAN Molt M.D.   On: 10/07/2023 23:41   CT Cervical Spine Wo Contrast Result Date: 10/07/2023 CLINICAL DATA:  Head trauma, minor (Age >= 65y) doac; Neck trauma (Age >= 65y) EXAM: CT HEAD WITHOUT CONTRAST CT CERVICAL SPINE WITHOUT CONTRAST TECHNIQUE: Multidetector CT imaging of the head and cervical spine was performed following the standard protocol without intravenous contrast. Multiplanar CT image reconstructions of the cervical spine were also generated. RADIATION DOSE REDUCTION: This exam was performed according to the departmental dose-optimization program which includes automated exposure control, adjustment of the mA and/or kV according to patient size and/or use of iterative reconstruction technique. COMPARISON:  CT head and cervical spine  April 18, 23. FINDINGS: CT HEAD FINDINGS Brain: No evidence of acute infarction, hemorrhage, hydrocephalus, extra-axial collection or mass lesion/mass effect. Right frontal lobe encephalomalacia. Patchy white matter hypodensities, compatible with chronic microvascular ischemic disease. Cerebral atrophy. Vascular: No hyperdense vessel or unexpected calcification. Skull: No acute fracture. Sinuses/Orbits: No acute finding. CT CERVICAL SPINE FINDINGS Alignment: No substantial sagittal subluxation. Skull base and vertebrae: No evidence of acute fracture. Osteopenia. Soft tissues and spinal canal: No prevertebral fluid or swelling. No visible canal hematoma. Disc levels:  No significant degenerative change. Upper chest: Chronic consolidation in the left upper lobe, partially imaged and mildly progressed since 2015. IMPRESSION: 1. No evidence of acute intracranial abnormality or cervical spine fracture. 2. Chronic consolidation in the left upper lobe, partially imaged and mildly progressed since CT chest from June 05, 2014 (Please see that study for differential considerations). A follow-up CT of the chest with contrast could provide further evaluation if clinically warranted. Electronically Signed   By: Gilmore GORMAN Molt M.D.   On: 10/07/2023 23:41   DG Ankle Complete Left Result Date: 10/07/2023 CLINICAL DATA:  Status post fall. EXAM: LEFT ANKLE COMPLETE - 3+ VIEW COMPARISON:  None Available. FINDINGS: There is no evidence of acute fracture, dislocation, or joint effusion. There is no evidence of significant arthropathy or other focal bone abnormality. Soft tissues are unremarkable. IMPRESSION: No acute osseous abnormality. Electronically Signed   By: Suzen Dials M.D.   On: 10/07/2023 23:34   DG Ankle Complete Right Result Date: 10/07/2023 CLINICAL DATA:  Status post fall. EXAM: RIGHT ANKLE - COMPLETE 3+ VIEW COMPARISON:  None Available. FINDINGS: There are acute nondisplaced fracture deformities  involving the distal shafts of the right tibia and right fibula. There is no evidence of dislocation. There is no evidence of significant arthropathy. Soft tissue swelling is seen surrounding the previously noted fracture sites. IMPRESSION: Acute fractures of the distal right tibia and distal right fibula. Electronically Signed   By: Suzen Dials M.D.   On: 10/07/2023 23:33    Pertinent labs & imaging results that were available during my care of the patient were reviewed by me and considered in my medical decision making (see MDM for details).  Medications Ordered in ED Medications  fentaNYL  (SUBLIMAZE ) injection 50 mcg (has no administration in time range)  morphine  (PF) 2 MG/ML injection 2 mg (2 mg Intravenous Given 10/07/23 2254)  ondansetron  (ZOFRAN ) injection 4 mg (4 mg Intravenous Given 10/07/23 2250)  sodium chloride  0.9 % bolus 500 mL (500 mLs Intravenous New Bag/Given 10/07/23 2250)  Procedures Procedures  (including critical care time)  Medical Decision Making / ED Course    Medical Decision Making:    Marcia Woods is a 89 y.o. female with past medical history as below, significant for A-fib on DOAC, PSVT, GERD, wheelchair dependent who presents to the ED with complaint of fall, leg injury. The complaint involves an extensive differential diagnosis and also carries with it a high risk of complications and morbidity.  Serious etiology was considered. Ddx includes but is not limited to: Fracture, dislocation, sprain, strain, contusion, vascular injury, ich, sdh, etc  Complete initial physical exam performed, notably the patient was in mild distress 2/2 pain.    Reviewed and confirmed nursing documentation for past medical history, family history, social history.  Vital signs reviewed.    Clinical Course as of 10/08/23 0030  Fri Oct 07, 2023   2206 Ankle xr right with tib/fib fx, she has palpable dp pulse on the right [SG]  Sat Oct 08, 2023  0029 CTH/CT cervical stable [SG]    Clinical Course User Index [SG] Elnor Jayson LABOR, DO    Brief summary: 89 year old female with history as above including anticoagulated here with fall. She has tib-fib fracture to her right lower extremity.  LE NVI.  She has large hematoma at the site of the fracture. While applying splint she is not complaining of severe pain to her right hip.  Will obtain x-ray.  She has a prior right pubic ramus fracture from around 2 years ago. CTH and cervical spine is pending, no obvious ICH on my read, pending radiology read.   Spoke w/ Dr Georgina ortho. If pt needs ortho surgery needs to come to [facility] who reports pt is non-ambulatory, they do not want orthopedic surgery  Per Dr Georgina no need for admission overnight, recommend f/u in the office, NWB RLE   Handoff to Dr Roselyn pending hip xr          Additional history obtained: -Additional history obtained from family -External records from outside source obtained and reviewed including: Chart review including previous notes, labs, imaging, consultation notes including  Home medications, prior imaging   Lab Tests: -I ordered, reviewed, and interpreted labs.   The pertinent results include:   Labs Reviewed  BASIC METABOLIC PANEL WITH GFR - Abnormal; Notable for the following components:      Result Value   Glucose, Bld 125 (*)    Creatinine, Ser 1.11 (*)    GFR, Estimated 46 (*)    All other components within normal limits  CBC WITH DIFFERENTIAL/PLATELET    Notable for na  EKG   EKG Interpretation Date/Time:    Ventricular Rate:    PR Interval:    QRS Duration:    QT Interval:    QTC Calculation:   R Axis:      Text Interpretation:           Imaging Studies ordered: I ordered imaging studies including CTH CT Cervical, ankle xr b/l, hip/pelvis xr I independently  visualized the following imaging with scope of interpretation limited to determining acute life threatening conditions related to emergency care; findings noted above I independently visualized and interpreted imaging. I agree with the radiologist interpretation   Medicines ordered and prescription drug management: Meds ordered this encounter  Medications   morphine  (PF) 2 MG/ML injection 2 mg   ondansetron  (ZOFRAN ) injection 4 mg   sodium chloride  0.9 % bolus 500 mL   fentaNYL  (SUBLIMAZE ) injection  50 mcg    -I have reviewed the patients home medicines and have made adjustments as needed   Consultations Obtained: I requested consultation with the ortho dr georgina,  and discussed lab and imaging findings as well as pertinent plan - they recommend: as above   Cardiac Monitoring: Continuous pulse oximetry interpreted by myself, 97% on RA.    Social Determinants of Health:  Diagnosis or treatment significantly limited by social determinants of health: SNF   Reevaluation: After the interventions noted above, I reevaluated the patient and found that they have improved  Co morbidities that complicate the patient evaluation  Past Medical History:  Diagnosis Date   Ankle fracture 05/22/2013   Anxiety    Arthritis    Atrial fibrillation (HCC)    Constipation    Dermatitis of lower extremity 11/18/2015   GERD (gastroesophageal reflux disease)    Hyperlipemia    Macular degeneration    Nodule of left lung    PSVT (paroxysmal supraventricular tachycardia) (HCC) 03/05/2013   Vaginal atrophy       Dispostion: Disposition decision including need for hospitalization was considered, and patient disposition pending at time of sign out.    Final Clinical Impression(s) / ED Diagnoses Final diagnoses:  Closed fracture of shaft of right tibia, unspecified fracture morphology, initial encounter  Closed fracture of shaft of right fibula, unspecified fracture morphology, initial encounter   Anticoagulated        Elnor Jayson LABOR, DO 10/08/23 0030

## 2023-10-07 NOTE — ED Triage Notes (Addendum)
 Pt BIB [facility] from [facility] after she fell out of bed unwitnessed. Obvious deformity of the right ankle. Complaining of 9/10 pain. Patient on eliquis .

## 2023-10-08 DIAGNOSIS — S82201A Unspecified fracture of shaft of right tibia, initial encounter for closed fracture: Secondary | ICD-10-CM | POA: Diagnosis not present

## 2023-10-08 DIAGNOSIS — Z743 Need for continuous supervision: Secondary | ICD-10-CM | POA: Diagnosis not present

## 2023-10-08 DIAGNOSIS — F0390 Unspecified dementia without behavioral disturbance: Secondary | ICD-10-CM | POA: Diagnosis not present

## 2023-10-08 DIAGNOSIS — R41841 Cognitive communication deficit: Secondary | ICD-10-CM | POA: Diagnosis not present

## 2023-10-08 DIAGNOSIS — R531 Weakness: Secondary | ICD-10-CM | POA: Diagnosis not present

## 2023-10-08 MED ORDER — HYDROCODONE-ACETAMINOPHEN 5-325 MG PO TABS
1.0000 | ORAL_TABLET | Freq: Once | ORAL | Status: AC
  Administered 2023-10-08: 1 via ORAL
  Filled 2023-10-08: qty 1

## 2023-10-08 MED ORDER — HYDROCODONE-ACETAMINOPHEN 5-325 MG PO TABS: 1.0000 | ORAL_TABLET | Freq: Four times a day (QID) | ORAL | 0 refills | Status: AC | PRN

## 2023-10-08 MED ORDER — FENTANYL CITRATE PF 50 MCG/ML IJ SOSY
50.0000 ug | PREFILLED_SYRINGE | Freq: Once | INTRAMUSCULAR | Status: DC
  Filled 2023-10-08: qty 1

## 2023-10-08 NOTE — ED Notes (Signed)
 Pt is resting comfortably at this time. Pt had fallen asleep around midnight and has been asleep since

## 2023-10-08 NOTE — ED Notes (Signed)
 Attempted to call report to the nurse of [facility]. No answer at this time.

## 2023-10-08 NOTE — ED Notes (Signed)
 Pt's brief and linen was changed at this time. Pending transport

## 2023-10-08 NOTE — ED Notes (Signed)
 Delay ion d/c due to waiting on transport. Transport will likely happen after shift change.

## 2023-10-08 NOTE — ED Notes (Signed)
 Before pt went to xray 15 mins ago and asking something for pain. When pt came back from x-ray, pain was assessed and pt denied any pain at this time.

## 2023-10-08 NOTE — ED Provider Notes (Signed)
 Care assumed at triage. Patient is non-ambulatory from SNF with fall from bed and non-displaced tib-fib fracture splinted and planned for outpatient management. Awaiting xray of hip at shift change which was negative for acute fracture and only showed chronic pubic rami fractures known from prior fall. Will d/c back to SNF as per previous plan. Rx for Norco as needed for pain.   Charmayne Cooper, MD 10/08/23 [phone]

## 2023-10-10 ENCOUNTER — Other Ambulatory Visit: Payer: Self-pay

## 2023-10-10 ENCOUNTER — Emergency Department (HOSPITAL_COMMUNITY)
Admission: EM | Admit: 2023-10-10 | Discharge: 2023-10-10 | Disposition: A | Discharge status: Skilled Nursing Facility | Attending: Emergency Medicine | Admitting: Emergency Medicine

## 2023-10-10 ENCOUNTER — Encounter (HOSPITAL_COMMUNITY): Payer: Self-pay

## 2023-10-10 ENCOUNTER — Emergency Department (HOSPITAL_COMMUNITY)

## 2023-10-10 DIAGNOSIS — S82831D Other fracture of upper and lower end of right fibula, subsequent encounter for closed fracture with routine healing: Secondary | ICD-10-CM | POA: Diagnosis not present

## 2023-10-10 DIAGNOSIS — W19XXXD Unspecified fall, subsequent encounter: Secondary | ICD-10-CM | POA: Diagnosis not present

## 2023-10-10 DIAGNOSIS — Z7901 Long term (current) use of anticoagulants: Secondary | ICD-10-CM | POA: Diagnosis not present

## 2023-10-10 DIAGNOSIS — S82401G Unspecified fracture of shaft of right fibula, subsequent encounter for closed fracture with delayed healing: Secondary | ICD-10-CM | POA: Insufficient documentation

## 2023-10-10 DIAGNOSIS — Z7401 Bed confinement status: Secondary | ICD-10-CM | POA: Diagnosis not present

## 2023-10-10 DIAGNOSIS — R52 Pain, unspecified: Secondary | ICD-10-CM | POA: Diagnosis not present

## 2023-10-10 DIAGNOSIS — S82201G Unspecified fracture of shaft of right tibia, subsequent encounter for closed fracture with delayed healing: Secondary | ICD-10-CM | POA: Insufficient documentation

## 2023-10-10 DIAGNOSIS — S82301A Unspecified fracture of lower end of right tibia, initial encounter for closed fracture: Secondary | ICD-10-CM | POA: Diagnosis not present

## 2023-10-10 DIAGNOSIS — M79604 Pain in right leg: Secondary | ICD-10-CM | POA: Diagnosis not present

## 2023-10-10 DIAGNOSIS — R4182 Altered mental status, unspecified: Secondary | ICD-10-CM | POA: Diagnosis not present

## 2023-10-10 NOTE — ED Triage Notes (Signed)
 Pt arrived from [facility] via [facility] c/o increased right leg pain s/p fx last week.

## 2023-10-10 NOTE — ED Notes (Signed)
 [facility] in facility to transport patient.

## 2023-10-10 NOTE — ED Notes (Signed)
 MD approved splint place by tech

## 2023-10-10 NOTE — ED Notes (Signed)
 Daughter at bedside. Ice water  given

## 2023-10-10 NOTE — ED Provider Notes (Addendum)
 [place] EMERGENCY DEPARTMENT AT [facility] Provider Note   CSN: [id number] Arrival date & time: 10/10/23  0203     History  Chief Complaint  Patient presents with   Leg Pain    Marcia Woods is a 89 y.o. female.  Patient presents to the emergency department for evaluation of increased pain of the right leg.  Patient was seen in the ED recently after a fall and diagnosed with tibia-fibula fracture.  She is in a splint.  Nursing home reports that she may have decreased capillary refill.       Home Medications Prior to Admission medications   Medication Sig Start Date End Date Taking? Authorizing Provider  apixaban  (ELIQUIS ) 5 MG TABS tablet Take 1 tablet (5 mg total) by mouth 2 (two) times daily. 04/29/20   Gerard Knight, MD  diltiazem  (CARDIZEM  CD) 240 MG 24 hr capsule TAKE (1) CAPSULE BY MOUTH ONCE DAILY. 02/02/21   Gerard Knight, MD  furosemide  (LASIX ) 20 MG tablet TAKE (1) TABLET BY MOUTH ONCE DAILY. Patient taking differently: Take 20 mg by mouth daily as needed for fluid. 05/25/21   Gerard Knight, MD  HYDROcodone -acetaminophen  (NORCO/VICODIN) 5-325 MG tablet Take 1 tablet by mouth every 6 (six) hours as needed for severe pain (pain score 7-10). 10/08/23   Charmayne Cooper, MD  metoprolol  tartrate (LOPRESSOR ) 25 MG tablet Take 1 tablet (25 mg total) by mouth 2 (two) times daily. 10/13/21   Demaris Fillers, MD  nystatin  cream (MYCOSTATIN ) Apply to affected area 2 times daily 12/07/22   Spence Dux, PA-C      Allergies    Codeine     Review of Systems   Review of Systems  Physical Exam Updated Vital Signs BP (!) 140/94   Pulse 84   Temp 98.3 F (36.8 C) (Oral)   Resp 16   Ht 5\' 5"  (1.651 m)   Wt 75.2 kg   SpO2 95%   BMI 27.59 kg/m  Physical Exam Vitals and nursing note reviewed.  Constitutional:      General: She is not in acute distress.    Appearance: She is well-developed.  HENT:     Head: Normocephalic and atraumatic.      Mouth/Throat:     Mouth: Mucous membranes are moist.  Eyes:     General: Vision grossly intact. Gaze aligned appropriately.     Extraocular Movements: Extraocular movements intact.     Conjunctiva/sclera: Conjunctivae normal.  Cardiovascular:     Rate and Rhythm: Normal rate and regular rhythm.     Pulses:          Dorsalis pedis pulses are 1+ on the right side.     Heart sounds: Normal heart sounds, S1 normal and S2 normal. No murmur heard.    No friction rub. No gallop.  Pulmonary:     Effort: Pulmonary effort is normal. No respiratory distress.     Breath sounds: Normal breath sounds.  Abdominal:     General: Bowel sounds are normal.     Palpations: Abdomen is soft.     Tenderness: There is no abdominal tenderness. There is no guarding or rebound.     Hernia: No hernia is present.  Musculoskeletal:        General: No swelling.     Cervical back: Full passive range of motion without pain, normal range of motion and neck supple. No spinous process tenderness or muscular tenderness. Normal range of motion.  Right lower leg: No edema.     Left lower leg: No edema.     Comments: posterior and sugar-tong splint in place  Skin:    General: Skin is warm and dry.     Capillary Refill: Capillary refill takes less than 2 seconds.     Findings: No ecchymosis, erythema, rash or wound.  Neurological:     General: No focal deficit present.     Mental Status: She is alert and oriented to person, place, and time.     GCS: GCS eye subscore is 4. GCS verbal subscore is 5. GCS motor subscore is 6.     Cranial Nerves: Cranial nerves 2-12 are intact.     Sensory: Sensation is intact.     Motor: Motor function is intact.     Coordination: Coordination is intact.  Psychiatric:        Attention and Perception: Attention normal.        Mood and Affect: Mood normal.        Speech: Speech normal.        Behavior: Behavior normal.     ED Results / Procedures / Treatments   Labs (all labs  ordered are listed, but only abnormal results are displayed) Labs Reviewed - No data to display  EKG None  Radiology No results found.  Procedures Procedures    Medications Ordered in ED Medications - No data to display  ED Course/ Medical Decision Making/ A&P                                 Medical Decision Making Amount and/or Complexity of Data Reviewed Radiology: ordered.   Patient arrives in the short leg splint that was placed in the ED when she had her original injury.  This was removed.  Patient is neurovascularly intact.  She has a palpable dorsalis pedal pulse, normal capillary refill.  X-ray does show some slight displacement of the fracture compared to previous.  Reviewing records indicates that the patient is nonambulatory and family did not wish for surgery.  Will replace the splint and patient needs to follow-up with orthopedics as an outpatient for possible casting or other definitive treatment to allow for healing of the fracture.   SPLINT APPLICATION Date/Time: 3:39 AM Authorized by: Ballard Bongo Consent: Verbal consent obtained. Risks and benefits: risks, benefits and alternatives were discussed Consent given by: patient Splint applied by:ED technician Location details: leg Splint type: short leg Supplies used: orthoglass Post-procedure: The splinted body part was neurovascularly unchanged following the procedure. Patient tolerance: Patient tolerated the procedure well with no immediate complications.       Final Clinical Impression(s) / ED Diagnoses Final diagnoses:  Closed fracture of right tibia and fibula with delayed healing, subsequent encounter    Rx / DC Orders ED Discharge Orders     None         Ballard Bongo, MD 10/10/23 1610    Ballard Bongo, MD 10/10/23 [phone]

## 2023-10-17 DIAGNOSIS — R451 Restlessness and agitation: Secondary | ICD-10-CM | POA: Diagnosis not present

## 2023-10-17 DIAGNOSIS — R52 Pain, unspecified: Secondary | ICD-10-CM | POA: Diagnosis not present

## 2023-10-17 DIAGNOSIS — S82401G Unspecified fracture of shaft of right fibula, subsequent encounter for closed fracture with delayed healing: Secondary | ICD-10-CM | POA: Diagnosis not present

## 2023-10-17 DIAGNOSIS — S82201G Unspecified fracture of shaft of right tibia, subsequent encounter for closed fracture with delayed healing: Secondary | ICD-10-CM | POA: Diagnosis not present

## 2023-10-19 DIAGNOSIS — S82401G Unspecified fracture of shaft of right fibula, subsequent encounter for closed fracture with delayed healing: Secondary | ICD-10-CM | POA: Diagnosis not present

## 2023-10-19 DIAGNOSIS — S82201G Unspecified fracture of shaft of right tibia, subsequent encounter for closed fracture with delayed healing: Secondary | ICD-10-CM | POA: Diagnosis not present

## 2023-10-19 DIAGNOSIS — R451 Restlessness and agitation: Secondary | ICD-10-CM | POA: Diagnosis not present

## 2023-10-21 DIAGNOSIS — R238 Other skin changes: Secondary | ICD-10-CM | POA: Diagnosis not present

## 2023-10-21 DIAGNOSIS — S82401G Unspecified fracture of shaft of right fibula, subsequent encounter for closed fracture with delayed healing: Secondary | ICD-10-CM | POA: Diagnosis not present

## 2023-10-21 DIAGNOSIS — S82201G Unspecified fracture of shaft of right tibia, subsequent encounter for closed fracture with delayed healing: Secondary | ICD-10-CM | POA: Diagnosis not present

## 2023-10-27 ENCOUNTER — Ambulatory Visit: Admitting: Orthopedic Surgery

## 2023-10-27 ENCOUNTER — Other Ambulatory Visit (INDEPENDENT_AMBULATORY_CARE_PROVIDER_SITE_OTHER): Payer: Self-pay

## 2023-10-27 DIAGNOSIS — S82221A Displaced transverse fracture of shaft of right tibia, initial encounter for closed fracture: Secondary | ICD-10-CM

## 2023-10-27 DIAGNOSIS — M79661 Pain in right lower leg: Secondary | ICD-10-CM

## 2023-10-27 NOTE — Progress Notes (Signed)
 Orthopedic Surgery Progress Note   Assessment: Patient is a 89 y.o. female with displaced, closed right tibial shaft fracture   Plan: - Given patient's age and ambulatory status, elected to proceed with nonoperative treatment -Patient's tibia was displaced with posterior angulation, so a closed reduction was performed today in the office and a well-padded short leg cast was applied (see procedure note below) -Nonweightbearing right lower extremity in the cast -Keep cast on and dry at all times - Follow-up in office in 6 weeks, x-rays at next visit: AP/lateral right tibia in cast   Procedure note: The patient was in the seated position with her knee at 90 degrees.  Given her nonambulatory status and to aid with mobilization, decided to apply a short leg cast.  The patient's x-rays demonstrated a posterior angulation to the fracture, so a reduction maneuver was performed to reduce that angulation, while the reduction was held, a well-padded short leg cast was applied from just below the knee to the metatarsal heads of the foot.  Patient tolerated the procedure well.  An x-ray was performed after the reduction and cast application which showed improved alignment. ___________________________________________________________________________  History: Patient had a fall out of bed about 3 weeks ago.  She was brought to [facility] emergency department.  She was found to have a displaced tibia fracture.  She was placed into a short leg splint.  She comes today for orthopedic follow-up.  Pain has gotten better in her leg since initial injury.  I am getting collateral history from her relative today in the office, the patient is nonambulatory.  She resides in a nursing home.  She mostly spends her time in the bed.  She has dementia.   Physical Exam:  General: no acute distress, appears stated age Neurologic: alert, answering questions appropriately, following commands Respiratory: unlabored breathing on  room air, symmetric chest rise Psychiatric: appropriate affect, normal cadence to speech  MSK:   -Right lower extremity  No open wounds, recurvatum seen at the tibia EHL/TA/GSC intact Plantarflexes and dorsiflexes toes Sensation intact to light touch in sural, saphenous, tibial, deep peroneal, and superficial peroneal nerve distributions Foot warm and well perfused  Imaging: X-rays of the right tibia from 10/27/2023 were independently reviewed and interpreted, showing a distal one third tibial shaft fracture.  There is an associated fibular fracture at the same level of the tibia fracture.  The tibia fracture is a short oblique fracture.  There is posterior angulation to the fracture.  There is minimal displacement on the AP view.  No other fracture seen.  X-rays of the right tibia from 10/27/2023 were independent reviewed and interpreted, showing the same distal one third tibial shaft fracture with overlying cast material.  There is minimal displacement seen on the AP view.  Improved alignment is noted on the lateral view with no angulation but there is slight posterior translation of the distal fragment with shortening through the fracture.   Patient name: Marcia Woods Patient MRN: [id number] Date: 10/27/23

## 2023-10-28 DIAGNOSIS — Z515 Encounter for palliative care: Secondary | ICD-10-CM | POA: Diagnosis not present

## 2023-11-02 DIAGNOSIS — F0390 Unspecified dementia without behavioral disturbance: Secondary | ICD-10-CM | POA: Diagnosis not present

## 2023-11-02 DIAGNOSIS — S82891A Other fracture of right lower leg, initial encounter for closed fracture: Secondary | ICD-10-CM | POA: Diagnosis not present

## 2023-11-03 DIAGNOSIS — I10 Essential (primary) hypertension: Secondary | ICD-10-CM | POA: Diagnosis not present

## 2023-11-03 DIAGNOSIS — F0390 Unspecified dementia without behavioral disturbance: Secondary | ICD-10-CM | POA: Diagnosis not present

## 2023-11-03 DIAGNOSIS — S82891A Other fracture of right lower leg, initial encounter for closed fracture: Secondary | ICD-10-CM | POA: Diagnosis not present

## 2023-11-18 DIAGNOSIS — F0390 Unspecified dementia without behavioral disturbance: Secondary | ICD-10-CM | POA: Diagnosis not present

## 2023-11-18 DIAGNOSIS — F419 Anxiety disorder, unspecified: Secondary | ICD-10-CM | POA: Diagnosis not present

## 2023-11-18 DIAGNOSIS — Z515 Encounter for palliative care: Secondary | ICD-10-CM | POA: Diagnosis not present

## 2023-12-08 ENCOUNTER — Ambulatory Visit: Admitting: Orthopedic Surgery

## 2023-12-08 ENCOUNTER — Other Ambulatory Visit (INDEPENDENT_AMBULATORY_CARE_PROVIDER_SITE_OTHER): Payer: Self-pay

## 2023-12-08 DIAGNOSIS — S82221A Displaced transverse fracture of shaft of right tibia, initial encounter for closed fracture: Secondary | ICD-10-CM

## 2023-12-08 NOTE — Progress Notes (Signed)
 Orthopedic Surgery Progress Note     Assessment: Patient is a 89 y.o. female with displaced, closed right tibial shaft fracture     Plan: -Discussed the interval displacement with the patient and her daughter.  Patient is nonambulatory and is not interested in any kind of surgery.  She is not having any pain.  Accordingly, we decided to continue with nonoperative management.  Since she is nonambulatory, I told them I am not concerned about the change in the alignment.  Our main goals would be to get the fracture to heal and for her not to have pain.  Will keep her in the cast for another 4 weeks -Nonweightbearing right lower extremity in the cast -Keep cast on and dry at all times - Follow-up in office in 4 weeks, x-rays at next visit: AP/lateral right tibia out of cast   ___________________________________________________________________________   History: Patient states that she has been doing well since she was last seen in the office.  She said she is not having any pain in the leg.  She said she is doing okay in the cast.  She is not taking any medications for pain.  She has no complaints at today's visit.     Physical Exam:   General: no acute distress, appears stated age Neurologic: alert, answering questions appropriately, following commands Respiratory: unlabored breathing on room air, symmetric chest rise Psychiatric: appropriate affect, normal cadence to speech   MSK:    -Right lower extremity             Cast in place with no breaks it or visible wear seen on the plantar aspect EHL/TA/GSC intact Plantarflexes and dorsiflexes toes Sensation intact to light touch in sural, saphenous, tibial, deep peroneal, and superficial peroneal nerve distributions Foot warm and well perfused   Imaging: XRs of the right tibia from 12/08/2023 were independently reviewed and interpreted, showing interval displacement of a distal one third tibial shaft fracture.  Displacement is most  noticeable on the AP view where there is a more varus alignment to the distal tibia fracture.  The lateral view appears similar to prior films from 10/27/2023.  No new fractures are seen.    Patient name: Marcia Woods Patient MRN: [id number] Date: 12/08/23

## 2023-12-22 DIAGNOSIS — F0390 Unspecified dementia without behavioral disturbance: Secondary | ICD-10-CM | POA: Diagnosis not present

## 2023-12-22 DIAGNOSIS — F419 Anxiety disorder, unspecified: Secondary | ICD-10-CM | POA: Diagnosis not present

## 2023-12-28 DIAGNOSIS — B351 Tinea unguium: Secondary | ICD-10-CM | POA: Diagnosis not present

## 2023-12-28 DIAGNOSIS — M79675 Pain in left toe(s): Secondary | ICD-10-CM | POA: Diagnosis not present

## 2023-12-28 DIAGNOSIS — M79674 Pain in right toe(s): Secondary | ICD-10-CM | POA: Diagnosis not present

## 2024-01-04 DIAGNOSIS — M6281 Muscle weakness (generalized): Secondary | ICD-10-CM | POA: Diagnosis not present

## 2024-01-04 DIAGNOSIS — S82891A Other fracture of right lower leg, initial encounter for closed fracture: Secondary | ICD-10-CM | POA: Diagnosis not present

## 2024-01-04 DIAGNOSIS — R600 Localized edema: Secondary | ICD-10-CM | POA: Diagnosis not present

## 2024-01-04 DIAGNOSIS — I2510 Atherosclerotic heart disease of native coronary artery without angina pectoris: Secondary | ICD-10-CM | POA: Diagnosis not present

## 2024-01-11 DIAGNOSIS — M158 Other polyosteoarthritis: Secondary | ICD-10-CM | POA: Diagnosis not present

## 2024-01-11 DIAGNOSIS — S82891A Other fracture of right lower leg, initial encounter for closed fracture: Secondary | ICD-10-CM | POA: Diagnosis not present

## 2024-01-11 DIAGNOSIS — F0390 Unspecified dementia without behavioral disturbance: Secondary | ICD-10-CM | POA: Diagnosis not present

## 2024-01-11 DIAGNOSIS — I4891 Unspecified atrial fibrillation: Secondary | ICD-10-CM | POA: Diagnosis not present

## 2024-01-12 DIAGNOSIS — R799 Abnormal finding of blood chemistry, unspecified: Secondary | ICD-10-CM | POA: Diagnosis not present

## 2024-01-12 DIAGNOSIS — R680 Hypothermia, not associated with low environmental temperature: Secondary | ICD-10-CM | POA: Diagnosis not present

## 2024-01-13 DIAGNOSIS — I2510 Atherosclerotic heart disease of native coronary artery without angina pectoris: Secondary | ICD-10-CM | POA: Diagnosis not present

## 2024-01-13 DIAGNOSIS — R600 Localized edema: Secondary | ICD-10-CM | POA: Diagnosis not present

## 2024-01-13 DIAGNOSIS — S82891A Other fracture of right lower leg, initial encounter for closed fracture: Secondary | ICD-10-CM | POA: Diagnosis not present

## 2024-01-13 DIAGNOSIS — F0390 Unspecified dementia without behavioral disturbance: Secondary | ICD-10-CM | POA: Diagnosis not present

## 2024-01-17 DIAGNOSIS — I10 Essential (primary) hypertension: Secondary | ICD-10-CM | POA: Diagnosis not present

## 2024-01-17 DIAGNOSIS — F0390 Unspecified dementia without behavioral disturbance: Secondary | ICD-10-CM | POA: Diagnosis not present

## 2024-01-17 DIAGNOSIS — S82891A Other fracture of right lower leg, initial encounter for closed fracture: Secondary | ICD-10-CM | POA: Diagnosis not present

## 2024-02-23 ENCOUNTER — Ambulatory Visit (INDEPENDENT_AMBULATORY_CARE_PROVIDER_SITE_OTHER): Payer: Medicare (Managed Care)

## 2024-02-23 ENCOUNTER — Ambulatory Visit (INDEPENDENT_AMBULATORY_CARE_PROVIDER_SITE_OTHER): Payer: Medicare (Managed Care) | Admitting: Orthopedic Surgery

## 2024-02-23 DIAGNOSIS — S82221A Displaced transverse fracture of shaft of right tibia, initial encounter for closed fracture: Secondary | ICD-10-CM

## 2024-02-23 NOTE — Progress Notes (Signed)
 Orthopedic Surgery Progress Note     Assessment: Patient is a 89 y.o. female with displaced, closed right tibial shaft fracture     Plan: -Patient has not developed any significant callus formation on her x-rays but she has no motion at the fracture site is not having any pain.  She is not ambulatory.  So, I will discontinue the cast because she has developed a pressure ulcer.  It is unfortunate that she missed her follow-up visits as we may have been able to get to that ulcer sooner.  To treat that, I recommended daily wet-to-dry dressing changes.  Will continue to follow her to monitor for improvement in that ulcer.  -Patient should return to the office in 6 weeks, x-rays at the next visit: None  ___________________________________________________________________________   History:  Patient was last seen on 12/08/2023.  She missed her follow-up appointments and was never rescheduled by her skilled nursing facility.  She has been in a cast since that time.  She is not having any pain in her leg but has noticed pain in the foot.  She is not taking any medications for pain.     Physical Exam:   General: no acute distress, appears stated age Neurologic: alert, answering questions appropriately, following commands Respiratory: unlabored breathing on room air, symmetric chest rise Psychiatric: appropriate affect, normal cadence to speech   MSK:    -Right lower extremity             Equinus contracture at the ankle.  Unable to passively extend the ankle past 10 degrees of plantarflexion.  There is a ulcer over the dorsal aspect of the foot where the cast was resting on top of the foot.  There is healthy red tissue at the base of the ulcer.  No exposed bone or tendon.  There is no purulent drainage.  There is no erythema around the ulcer.  No tenderness to palpation over the thigh or leg.  Some tenderness to palpation around the ulcer over the dorsum of the foot.  Able to perform straight leg  raise with no pain at the leg and no motion seen at the fracture site EHL/TA/GSC intact Plantarflexes and dorsiflexes toes Sensation intact to light touch in sural, saphenous, tibial, deep peroneal, and superficial peroneal nerve distributions Foot warm and well perfused   Imaging: XRs of the right tibia from 02/23/2024 were independently reviewed and interpreted, showing a minimally displaced but angulated fracture.  There is varus alignment and recurvatum seen.  Possible callus formation seen posteriorly but no other significant callus formation seen.  No new fracture seen.     Patient name: Marcia Woods Patient MRN: [id number] Date: 02/23/24

## 2024-02-28 ENCOUNTER — Telehealth: Payer: Self-pay | Admitting: Orthopedic Surgery

## 2024-02-28 NOTE — Telephone Encounter (Signed)
 Marcia Woods from [facility] wants to know if she can stand to do PT. Also what is the weight barring status? CB#[phone]

## 2024-02-28 NOTE — Telephone Encounter (Signed)
I called and advised her of Dr. Georgia Duff message

## 2024-03-29 ENCOUNTER — Ambulatory Visit: Admitting: Orthopedic Surgery

## 2024-04-26 ENCOUNTER — Ambulatory Visit (INDEPENDENT_AMBULATORY_CARE_PROVIDER_SITE_OTHER): Payer: Medicare (Managed Care) | Admitting: Orthopedic Surgery

## 2024-04-26 DIAGNOSIS — S82221P Displaced transverse fracture of shaft of right tibia, subsequent encounter for closed fracture with malunion: Secondary | ICD-10-CM | POA: Diagnosis not present

## 2024-04-26 NOTE — Progress Notes (Signed)
 Orthopedic Surgery Progress Note     Assessment: Patient is a 89 y.o. female with displaced, closed right tibial shaft fracture that was treated nonoperatively     Plan: - Patient still without any pain at the fracture site even with manipulation.  She is not taking any medications for pain and is nonambulatory.  Her ulcer over the dorsum of her foot has healed.  No further intervention recommended at this time.  Can use over-the-counter medications as needed for pain relief -Patient should return to the office on an as-needed basis   ___________________________________________________________________________   History:  Patient is not having any pain in her leg.  She has been out of the cast now for over 6 weeks.  She has not been doing any dressing changes or treatment to the ulcer that had developed over her foot.  She has no complaints at today's visit.     Physical Exam:   General: no acute distress, appears stated age Neurologic: alert, answering questions appropriately, following commands Respiratory: unlabored breathing on room air, symmetric chest rise Psychiatric: appropriate affect, normal cadence to speech   MSK:    -Right lower extremity  Equinus contracture at the ankle.  Still lacking 10 degrees of dorsiflexion to get to neutral.  The ulcer over the dorsal aspect of her foot has healed             No tenderness to palpation over the thigh or leg.  No pain with manipulation proximal and distal to the fracture site EHL/TA/GSC intact Plantarflexes and dorsiflexes toes Sensation intact to light touch in sural, saphenous, tibial, deep peroneal, and superficial peroneal nerve distributions Foot warm and well perfused   Imaging: XRs of the right tibia from 02/23/2024 were previously independently reviewed and interpreted, showing a minimally displaced but angulated fracture.  There is varus alignment and recurvatum seen.  Possible callus formation seen posteriorly but no  other significant callus formation seen.  No new fracture seen.     Patient name: Marcia Woods Patient MRN: [id number] Date: 04/26/24
